# Patient Record
Sex: Male | Born: 1935
Health system: Southern US, Community
[De-identification: ages and names within clinical notes are randomized; demographics above are authoritative.]

## PROBLEM LIST (undated history)

## (undated) DIAGNOSIS — IMO0001 Reserved for inherently not codable concepts without codable children: Secondary | ICD-10-CM

## (undated) DIAGNOSIS — N400 Enlarged prostate without lower urinary tract symptoms: Secondary | ICD-10-CM

## (undated) DIAGNOSIS — Z87442 Personal history of urinary calculi: Secondary | ICD-10-CM

## (undated) DIAGNOSIS — Z9289 Personal history of other medical treatment: Secondary | ICD-10-CM

## (undated) DIAGNOSIS — I809 Phlebitis and thrombophlebitis of unspecified site: Secondary | ICD-10-CM

## (undated) DIAGNOSIS — E785 Hyperlipidemia, unspecified: Secondary | ICD-10-CM

## (undated) DIAGNOSIS — R911 Solitary pulmonary nodule: Secondary | ICD-10-CM

## (undated) DIAGNOSIS — I4510 Unspecified right bundle-branch block: Secondary | ICD-10-CM

## (undated) DIAGNOSIS — N281 Cyst of kidney, acquired: Secondary | ICD-10-CM

## (undated) DIAGNOSIS — I10 Essential (primary) hypertension: Secondary | ICD-10-CM

## (undated) DIAGNOSIS — K4090 Unilateral inguinal hernia, without obstruction or gangrene, not specified as recurrent: Secondary | ICD-10-CM

## (undated) DIAGNOSIS — C801 Malignant (primary) neoplasm, unspecified: Secondary | ICD-10-CM

## (undated) DIAGNOSIS — I499 Cardiac arrhythmia, unspecified: Secondary | ICD-10-CM

## (undated) DIAGNOSIS — IMO0002 Reserved for concepts with insufficient information to code with codable children: Secondary | ICD-10-CM

## (undated) HISTORY — DX: Hyperlipidemia, unspecified: E78.5

## (undated) HISTORY — DX: Reserved for inherently not codable concepts without codable children: IMO0001

## (undated) HISTORY — DX: Benign prostatic hyperplasia without lower urinary tract symptoms: N40.0

## (undated) HISTORY — PX: NO PAST SURGERIES: SHX2092

## (undated) HISTORY — DX: Unspecified right bundle-branch block: I45.10

## (undated) HISTORY — DX: Cyst of kidney, acquired: N28.1

## (undated) HISTORY — DX: Reserved for concepts with insufficient information to code with codable children: IMO0002

## (undated) HISTORY — DX: Solitary pulmonary nodule: R91.1

## (undated) HISTORY — DX: Essential (primary) hypertension: I10

## (undated) HISTORY — PX: OTHER SURGICAL HISTORY: SHX169

---

## 1998-06-06 ENCOUNTER — Emergency Department (HOSPITAL_COMMUNITY): Admission: EM | Admit: 1998-06-06 | Discharge: 1998-06-06 | Payer: Self-pay | Admitting: Emergency Medicine

## 1999-03-11 ENCOUNTER — Emergency Department (HOSPITAL_COMMUNITY): Admission: EM | Admit: 1999-03-11 | Discharge: 1999-03-11 | Payer: Self-pay | Admitting: Emergency Medicine

## 1999-03-13 ENCOUNTER — Inpatient Hospital Stay (HOSPITAL_COMMUNITY): Admission: EM | Admit: 1999-03-13 | Discharge: 1999-03-19 | Payer: Self-pay | Admitting: *Deleted

## 1999-03-13 ENCOUNTER — Emergency Department (HOSPITAL_COMMUNITY): Admission: EM | Admit: 1999-03-13 | Discharge: 1999-03-13 | Payer: Self-pay | Admitting: Emergency Medicine

## 1999-03-14 ENCOUNTER — Encounter: Payer: Self-pay | Admitting: Otolaryngology

## 1999-03-17 ENCOUNTER — Encounter: Payer: Self-pay | Admitting: Otolaryngology

## 1999-06-21 ENCOUNTER — Observation Stay (HOSPITAL_COMMUNITY): Admission: EM | Admit: 1999-06-21 | Discharge: 1999-06-21 | Payer: Self-pay | Admitting: Podiatry

## 1999-06-21 ENCOUNTER — Encounter: Payer: Self-pay | Admitting: *Deleted

## 2005-07-24 ENCOUNTER — Encounter: Admission: RE | Admit: 2005-07-24 | Discharge: 2005-07-24 | Payer: Self-pay | Admitting: Internal Medicine

## 2005-08-27 ENCOUNTER — Ambulatory Visit: Payer: Self-pay | Admitting: Internal Medicine

## 2005-10-09 ENCOUNTER — Ambulatory Visit: Payer: Self-pay | Admitting: Internal Medicine

## 2012-10-23 ENCOUNTER — Ambulatory Visit (INDEPENDENT_AMBULATORY_CARE_PROVIDER_SITE_OTHER): Payer: Medicare Other | Admitting: Family Medicine

## 2012-10-23 VITALS — BP 154/82 | HR 85 | Temp 98.2°F | Resp 18 | Ht 65.0 in | Wt 119.4 lb

## 2012-10-23 DIAGNOSIS — T148 Other injury of unspecified body region: Secondary | ICD-10-CM

## 2012-10-23 DIAGNOSIS — L853 Xerosis cutis: Secondary | ICD-10-CM

## 2012-10-23 DIAGNOSIS — L989 Disorder of the skin and subcutaneous tissue, unspecified: Secondary | ICD-10-CM

## 2012-10-23 DIAGNOSIS — W57XXXA Bitten or stung by nonvenomous insect and other nonvenomous arthropods, initial encounter: Secondary | ICD-10-CM

## 2012-10-23 MED ORDER — UREA 40 % EX CREA: 2.0000 g | TOPICAL_CREAM | Freq: Every day | CUTANEOUS | Status: DC

## 2012-10-23 MED ORDER — DOXYCYCLINE HYCLATE 100 MG PO TABS: 100.0000 mg | ORAL_TABLET | Freq: Two times a day (BID) | ORAL | Status: DC

## 2012-10-23 NOTE — Progress Notes (Signed)
[facility]:  Office Visit  Chief Complaint:  Chief Complaint  Patient presents with  . Tick Removal    tick bite on left groin area but tick is gone; back x7 days ago    HPI: Patrick Carr is a 79 y.o. male who complains of  Tick bite 6 day sago , probably got it from [place] near [place]. HE was working in old house and was eaten up by ticks.  He was in area that was overgrown with grass/tress. + rash, no HA, fevers, chills, n/v/abd pain. No confusion, Weakness, joint pain.    Past Medical History  Diagnosis Date  . Hypertension   . Hyperlipidemia   . BPH (benign prostatic hyperplasia)    Past Surgical History  Procedure Laterality Date  . Sinus artery surgery     History   Social History  . Marital Status: Married    Spouse Name: N/A    Number of Children: N/A  . Years of Education: N/A   Social History Main Topics  . Smoking status: Current Every Day Smoker  . Smokeless tobacco: Never Used  . Alcohol Use: No  . Drug Use: No  . Sexually Active: None   Other Topics Concern  . None   Social History Narrative  . None   Family History  Problem Relation Age of Onset  . Heart disease Mother   . Cancer Father    Allergies  Allergen Reactions  . Other Other (See Comments)    Pneumonia vaccine causes arm swelling    Prior to Admission medications   Medication Sig Start Date End Date Taking? Authorizing Provider  alfuzosin (UROXATRAL) 10 MG 24 hr tablet Take 10 mg by mouth daily.   Yes Historical Provider, MD  amLODipine (NORVASC) 5 MG tablet Take 5 mg by mouth daily.   Yes Historical Provider, MD  aspirin 81 MG tablet Take 81 mg by mouth daily.   Yes Historical Provider, MD  atorvastatin (LIPITOR) 40 MG tablet Take 40 mg by mouth daily.   Yes Historical Provider, MD  losartan (COZAAR) 50 MG tablet Take 50 mg by mouth daily.   Yes Historical Provider, MD     ROS: The patient denies fevers, chills, night sweats,  unintentional weight loss, chest pain, palpitations, wheezing, dyspnea on exertion, nausea, vomiting, abdominal pain, dysuria, hematuria, melena, numbness, weakness, or tingling.   All other systems have been reviewed and were otherwise negative with the exception of those mentioned in the HPI and as above.    PHYSICAL EXAM: Filed Vitals:   10/23/12 1152  BP: 154/82  Pulse: 85  Temp: 98.2 F (36.8 C)  Resp: 18   Filed Vitals:   10/23/12 1152  Height: 5\' 5"  (1.651 m)  Weight: 119 lb 6.4 oz (54.159 kg)   Body mass index is 19.87 kg/(m^2).  General: Alert, no acute distress HEENT:  Normocephalic, atraumatic, oropharynx patent.  Cardiovascular:  Regular rate and rhythm, no rubs murmurs or gallops.  No Carotid bruits, radial pulse intact. No pedal edema.  Respiratory: Clear to auscultation bilaterally.  No wheezes, rales, or rhonchi.  No cyanosis, no use of accessory musculature GI: No organomegaly, abdomen is soft and non-tender, positive bowel sounds.  No masses. Skin: + left red groin rash and tick bites in mulitple places x 2 on back and 1 on left groin, rashes. Neurologic: Facial musculature symmetric. Psychiatric: Patient is appropriate throughout our interaction. Lymphatic: No cervical lymphadenopathy Musculoskeletal: Gait intact.   LABS:  No results found for this or any previous visit.   EKG/XRAY:   Primary read interpreted by Dr. Conley Rolls at [facility].   ASSESSMENT/PLAN: Encounter Diagnoses  Name Primary?  . Tick bite Yes  . Skin lesion   . Dry skin    Able to remove one tick with all body parts intact Other areas were scabs and I did not see any ticks on evaluationF/u prn if worsening sxs Rx Doxycycline 100 mg BID x 10 days for ppx tick born illnesses Rx Urea cream for dry skin,?  eczema on feet vs keratin buildup Will return in 4-8 weeks if desires lyme and RMSF  And ehrlichia titers.  F.u prn    LE, THAO PHUONG, DO 10/23/2012 1:56 PM

## 2014-01-03 DIAGNOSIS — R911 Solitary pulmonary nodule: Secondary | ICD-10-CM | POA: Insufficient documentation

## 2014-01-06 ENCOUNTER — Other Ambulatory Visit: Payer: Self-pay | Admitting: *Deleted

## 2014-01-06 DIAGNOSIS — R911 Solitary pulmonary nodule: Secondary | ICD-10-CM

## 2014-01-12 ENCOUNTER — Ambulatory Visit (HOSPITAL_COMMUNITY)
Admission: RE | Admit: 2014-01-12 | Discharge: 2014-01-12 | Disposition: A | Discharge status: Home / Self Care | Payer: Medicare Other | Source: Ambulatory Visit | Attending: Cardiothoracic Surgery | Admitting: Cardiothoracic Surgery

## 2014-01-12 ENCOUNTER — Other Ambulatory Visit: Payer: Self-pay | Admitting: *Deleted

## 2014-01-12 ENCOUNTER — Institutional Professional Consult (permissible substitution) (INDEPENDENT_AMBULATORY_CARE_PROVIDER_SITE_OTHER): Payer: Medicare Other | Admitting: Cardiothoracic Surgery

## 2014-01-12 ENCOUNTER — Encounter: Payer: Self-pay | Admitting: *Deleted

## 2014-01-12 ENCOUNTER — Inpatient Hospital Stay (HOSPITAL_COMMUNITY)
Admission: RE | Admit: 2014-01-12 | Discharge: 2014-01-12 | Disposition: A | Discharge status: Home / Self Care | Payer: Medicare Other | Source: Ambulatory Visit

## 2014-01-12 ENCOUNTER — Encounter: Payer: Self-pay | Admitting: Cardiothoracic Surgery

## 2014-01-12 VITALS — BP 127/72 | HR 100 | Ht 65.0 in | Wt 119.0 lb

## 2014-01-12 DIAGNOSIS — R911 Solitary pulmonary nodule: Secondary | ICD-10-CM

## 2014-01-12 DIAGNOSIS — E785 Hyperlipidemia, unspecified: Secondary | ICD-10-CM | POA: Insufficient documentation

## 2014-01-12 DIAGNOSIS — I2510 Atherosclerotic heart disease of native coronary artery without angina pectoris: Secondary | ICD-10-CM | POA: Insufficient documentation

## 2014-01-12 DIAGNOSIS — N281 Cyst of kidney, acquired: Secondary | ICD-10-CM | POA: Insufficient documentation

## 2014-01-12 DIAGNOSIS — N400 Enlarged prostate without lower urinary tract symptoms: Secondary | ICD-10-CM | POA: Insufficient documentation

## 2014-01-12 DIAGNOSIS — J438 Other emphysema: Secondary | ICD-10-CM | POA: Diagnosis not present

## 2014-01-12 DIAGNOSIS — I4510 Unspecified right bundle-branch block: Secondary | ICD-10-CM | POA: Insufficient documentation

## 2014-01-12 DIAGNOSIS — I10 Essential (primary) hypertension: Secondary | ICD-10-CM | POA: Insufficient documentation

## 2014-01-12 LAB — PULMONARY FUNCTION TEST
DL/VA % pred: 49 %
DL/VA: 2.03 ml/min/mmHg/L
DLCO cor % pred: 42 %
DLCO cor: 9.78 ml/min/mmHg
DLCO unc % pred: 42 %
DLCO unc: 9.78 ml/min/mmHg
FEF 25-75 Post: 0.66 L/sec
FEF 25-75 Pre: 0.58 L/sec
FEF2575-%Change-Post: 12 %
FEF2575-%Pred-Post: 44 %
FEF2575-%Pred-Pre: 39 %
FEV1-%Change-Post: 2 %
FEV1-%Pred-Post: 63 %
FEV1-%Pred-Pre: 61 %
FEV1-Post: 1.34 L
FEV1-Pre: 1.3 L
FEV1FVC-%Change-Post: -1 %
FEV1FVC-%Pred-Pre: 72 %
FEV6-%Change-Post: 3 %
FEV6-%Pred-Post: 91 %
FEV6-%Pred-Pre: 88 %
FEV6-Post: 2.54 L
FEV6-Pre: 2.45 L
FEV6FVC-%Change-Post: 1 %
FEV6FVC-%Pred-Post: 108 %
FEV6FVC-%Pred-Pre: 107 %
FVC-%Change-Post: 4 %
FVC-%Pred-Post: 86 %
FVC-%Pred-Pre: 82 %
FVC-Post: 2.59 L
FVC-Pre: 2.49 L
Post FEV1/FVC ratio: 52 %
Post FEV6/FVC ratio: 100 %
Pre FEV1/FVC ratio: 53 %
Pre FEV6/FVC Ratio: 99 %
RV % pred: 95 %
RV: 2.14 L
TLC % pred: 85 %
TLC: 4.81 L

## 2014-01-12 LAB — BUN: BUN: 22 mg/dL (ref 6–23)

## 2014-01-12 LAB — CREATININE, SERUM: Creat: 0.8 mg/dL (ref 0.50–1.35)

## 2014-01-12 LAB — GLUCOSE, CAPILLARY: Glucose-Capillary: 120 mg/dL — ABNORMAL HIGH (ref 70–99)

## 2014-01-12 MED ORDER — ALBUTEROL SULFATE (2.5 MG/3ML) 0.083% IN NEBU
2.5000 mg | INHALATION_SOLUTION | Freq: Once | RESPIRATORY_TRACT | Status: AC
  Administered 2014-01-12: 2.5 mg via RESPIRATORY_TRACT

## 2014-01-12 MED ORDER — FLUDEOXYGLUCOSE F - 18 (FDG) INJECTION
6.1000 | Freq: Once | INTRAVENOUS | Status: AC | PRN
  Administered 2014-01-12: 6.1 via INTRAVENOUS

## 2014-01-12 NOTE — Progress Notes (Addendum)
Patrick Carr       [place] [address]             [phone]                    Patrick Carr [facility] Record #[id number] Date of Birth: [birth year]  Referring: Patrick Carr,* Primary Care: Patrick Pel, MD  Chief Complaint:    Chief Complaint  Patient presents with  . NEW THORACIC    NEW LUNG MODULE/PET CT    History of Present Illness:    Patrick Carr 80 y.o. male is seen in the office  today for evaluation of abnormal chest ct and pet scan. Patient presents with 10-15 lb weight lass over 3 months. Patient is a long term and current smoker.  He was noted to have a elevated PSA and microscopic hematuria. He was referred to urology, a CT of chest abdomen pelvis was done. He has a distant history of rt upper lung lesion on ct 2007. Current chest xray and ct demonstrate new rt lung lesions. Patient has no hemoptysis . Denies any  previous cardiac history or current chest pain/ angina.   Patient is retired, previous worked in [employer]. 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   Zubrod Score: At the time of surgery this patient's most appropriate activity status/level should be described as: []     0    Normal activity, no symptoms []     1    Restricted in physical strenuous activity but ambulatory, able to do out light work [x]     2    Ambulatory and capable of self care, unable to do work activities, up and about               >50 % of waking hours                              []     3    Only limited self care, in bed greater than 50% of waking hours []     4    Completely disabled, no self care, confined to bed or chair []     5    Moribund   Past Medical History  Diagnosis Date  . Hypertension   . Hyperlipidemia   . BPH (benign prostatic  hyperplasia)     Past Surgical History  Procedure Laterality Date  . Sinus artery surgery      Family History  Problem Relation Age of Onset  . Heart disease Mother   . Cancer Father    Father died 32 with brain tumor, mother deceased  42 with heart failure     History  Smoking status  . Current Every Day Smoker  Smokeless tobacco  . Never Used    History  Alcohol Use No     Allergies  Allergen Reactions  . Other Other (See Comments)    Pneumonia vaccine causes arm swelling     Current Outpatient Prescriptions  Medication Sig Dispense Refill  . alfuzosin (UROXATRAL) 10 MG 24 hr tablet Take 10 mg by mouth daily.      [name] ALPRAZolam (XANAX) 0.25 MG tablet Take 0.25 mg by mouth 3 (three) times daily as needed for anxiety.      [name]  amLODipine (NORVASC) 5 MG tablet Take 5 mg by mouth daily.      [name] aspirin 81 MG tablet Take 81 mg by mouth daily.      [name] atorvastatin (LIPITOR) 40 MG tablet Take 40 mg by mouth daily.      [name] losartan (COZAAR) 50 MG tablet Take 50 mg by mouth daily.      [name] doxycycline (VIBRA-TABS) 100 MG tablet Take 1 tablet (100 mg total) by mouth 2 (two) times daily.  20 tablet  0  . urea (CARMOL) 40 % CREA Apply 2 g topically daily.  60 each  0   No current facility-administered medications for this visit.     Review of Systems:     Cardiac Review of Systems: Y or N  Chest Pain [ n   ]  Resting SOB [name]   ] Exertional SOB  [name]  ]  Orthopnea [ n ]   Pedal Edema [  n ]    Palpitations [nn  ] Syncope  [ n ]   Presyncope [n   ]  General Review of Systems: [Y] = yes [  ]=no Constitional: recent weight change [name]  ];  Wt loss over the last 3 months [   ] anorexia [  ]; fatigue [ y ]; nausea [  ]; night sweats [ nn ]; fever [  ]; or chills [  ];          Dental: poor dentition[  ]; Last Dentist visit:   Eye : blurred vision [  ]; diplopia [   ]; vision changes [  ];  Amaurosis fugax[  ]; Resp: cough [  ];  wheezing[  ];  hemoptysis[  ]; shortness of breath[  ];  paroxysmal nocturnal dyspnea[  ]; dyspnea on exertion[  ]; or orthopnea[  ];  GI:  gallstones[  ], vomiting[  ];  dysphagia[  ]; melena[  ];  hematochezia [  ]; heartburn[  ];   Hx of  Colonoscopy[  ]; GU: kidney stones [  ]; hematuria[  ];   dysuria [  ];  nocturia[  ];  history of     obstruction [  ]; urinary frequency [  ]             Skin: rash, swelling[  ];, hair loss[  ];  peripheral edema[  ];  or itching[  ]; Musculosketetal: myalgias[  ];  joint swelling[  ];  joint erythema[  ];  joint pain[  ];  back pain[  ];  Heme/Lymph: bruising[  ];  bleeding[  ];  anemia[  ];  Neuro: TIA[  ];  headaches[  ];  stroke[  ];  vertigo[  ];  seizures[  ];   paresthesias[  ];  difficulty walking[  ];  Psych:depression[  ]; anxiety[  ];  Endocrine: diabetes[  ];  thyroid dysfunction[  ];  Immunizations: Flu up to date [no  ]; Pneumococcal up to date [ 2004 ];  Other:  Physical Exam: Ht 5\' 5"  (1.651 m)  Wt 119 lb (53.978 kg)  BMI 19.80 kg/m2  PHYSICAL EXAMINATION:  General appearance: alert, cooperative, appears older than stated age and cachectic Neurologic: intact Heart: regular rate and rhythm, S1, S2 normal, no murmur, click, rub or gallop Lungs: diminished breath sounds bibasilar Abdomen: soft, non-tender; bowel sounds normal; no masses,  no organomegaly Extremities: extremities normal, atraumatic, no cyanosis or edema and Homans sign is negative, no sign of DVT +1  dp and pt pulses, no carotid bruites No cervical, axillary or supraclavicular adenopathy  Diagnostic Studies & Laboratory data:     Recent Radiology Findings:  Mr Patrick Carr Contrast  01/13/2014   CLINICAL DATA:  Lung nodule.  Staging for suspected lung cancer.  EXAM: MRI HEAD WITHOUT AND WITH CONTRAST  TECHNIQUE: Multiplanar, multiecho pulse sequences of the brain and surrounding structures were obtained without and with intravenous contrast.  CONTRAST:  63mL MULTIHANCE GADOBENATE DIMEGLUMINE 529 MG/ML IV SOLN  COMPARISON:   None.  FINDINGS: No evidence for acute infarction, hemorrhage, mass lesion, hydrocephalus, or extra-axial fluid. Generalized cerebral and cerebellar atrophy. Advanced subcortical and periventricular T2 hyperintensities consistent with chronic microvascular ischemic change. Scattered lacunes. Flow voids are maintained. Mild pannus. Upper cervical fusion across the C2 and C3 interspace may be degenerative in nature.  Post infusion, no abnormal enhancement of the brain or meninges. Extracranial soft tissues unremarkable. No acute sinus disease. Trace mastoid effusion. Negative orbits.  IMPRESSION: No evidence for intracranial metastatic disease.  Advanced atrophy and small vessel disease.   Electronically Signed   By: Rolla Flatten M.D.   On: 01/13/2014 12:03   Nm Pet Image Initial (pi) Skull Base To Thigh  01/12/2014   CLINICAL DATA:  Initial treatment strategy for pulmonary nodule.  EXAM: NUCLEAR MEDICINE PET SKULL BASE TO THIGH  TECHNIQUE: 6.1 mCi F-18 FDG was injected intravenously. Full-ring PET imaging was performed from the skull base to thigh after the radiotracer. CT data was obtained and used for attenuation correction and anatomic localization.  FASTING BLOOD GLUCOSE:  Value: 120 mg/dl  COMPARISON:  01/03/2014  FINDINGS: NECK  No hypermetabolic lymph nodes in the neck.  CHEST  Pulmonary nodule in the superior segment of right lower lobe measures 1.2 cm and has an SUV max equal to 3.9. Within the perihilar right lung there is a nodule or lymph node which measures approximately 1.3 cm and has an SUV max equal to 6.9. More centrally, there is a hypermetabolic right hilar lymph node within SUV max equal to 4.8. No hypermetabolic if see lateral or contralateral mediastinal adenopathy. There is no hypermetabolic contralateral hilar adenopathy.  The heart size appears normal. There is no pericardial effusion. Calcified atherosclerotic disease involves the thoracic aorta. There are also calcifications involving  the left circumflex and LAD coronary arteries. No axillary or supraclavicular adenopathy.  ABDOMEN/PELVIS  No abnormal uptake identified within the liver. The gallbladder is normal. The pancreas is unremarkable. Normal appearance of the spleen. The right adrenal gland appears normal. Mild nonspecific increased uptake identified within the left adrenal region. The SUV max is equal to 3.3.  No hypermetabolic lymph nodes identified within the abdomen or pelvis.  SKELETON  No focal hypermetabolic activity to suggest skeletal metastasis.  IMPRESSION: 1. Peripheral nodule in the right lower lobe exhibits malignant range FDG uptake and is worrisome for primary lung neoplasm. 2. Hypermetabolic  ipsilateral hilar lymph node metastasis. 3. Well-circumscribed hypermetabolic nodule within the right infrahilar region. This may represent a second pulmonary lesion or ipsilateral hilar lymph metastatic lymph node. 4. If the right infrahilar nodule is a separate pulmonary lesion then the stage would be considered T3 N1 M0 (IIIa). If the infrahilar nodule is a lymph node then this would be considered stage T1 N1 M0 (IIa). A contrast-enhanced CT of the chest may allow differentiation of this lesion. 5. Nonspecific FDG uptake associated with the left adrenal nodule. 6. Atherosclerotic disease including coronary artery calcifications 7. Emphysema.   Electronically  Signed   By: Kerby Moors M.D.   On: 01/12/2014 15:06   CLINICAL DATA: Hematuria. Abnormal chest radiograph with possible new pulmonary nodule. History of BPH and renal calculi.  EXAM: CT CHEST WITHOUT CONTRAST; CT ABDOMEN AND PELVIS WITHOUT AND WITH CONTRAST  TECHNIQUE: Multidetector CT imaging of the chest was performed without intravenous contrast. Multidetector CT imaging of the abdomen and pelvis was performed following the standard protocol before and during bolus administration of intravenous contrast.  CONTRAST: 125 ml Isovue-300.  COMPARISON: Chest  radiographs 12/07/2003. Chest CT 07/24/2005.  FINDINGS: CT CHEST  Mediastinum: As evaluated in the noncontrast state, there is no evidence of mediastinal or axillary lymphadenopathy. Hilar assessment is limited. However, there is a new 1.3 x 1.2 cm nodular density projecting posterior to the right hilum on image 33, suspicious for a nodal metastasis. The thyroid gland, trachea and esophagus appear normal. The heart size is normal. There is diffuse atherosclerosis of the aorta, great vessels and coronary arteries.  Lungs/Pleura: There is no pleural or pericardial effusion.Moderate diffuse emphysematous changes are present within both lungs. The partially calcified subpleural scarring at the right apex is similar, measuring 3.1 x 2.3 cm transverse on image 5. Corresponding with new radiographic finding is a spiculated nodule in the superior segment of the right lower lobe, measuring 1.3 x 0.9 cm on image 29. Morphologically, this is highly suspicious for bronchogenic carcinoma. There are no other suspicious pulmonary nodules.  Musculoskeletal/Chest wall: No chest wall lesions or suspicious osseous findings demonstrated.  CT ABDOMEN AND PELVIS FINDINGS  Kidneys / Ureters / Bladder: Pre-contrast images demonstrate no renal, ureteral or bladder calculi. Multiple simple renal cysts are present bilaterally, the largest posteriorly in the mid left kidney, measuring 2.5 cm. The left kidney demonstrates several hyperdense lesions. The largest of these are within the upper pole, measuring 12 mm on image 14 of series 4 and in the lower pole, measuring 13 mm on image 30. There is no definite enhancement of these lesions following contrast. Delayed images result in segmental visualization of the ureters. No urothelial abnormalities are identified. The bladder is trabeculated without apparent focal mucosal lesion.  Other Solid Abdominal Viscera: Small hepatic cysts are noted. No evidence of  gallstones, gallbladder wall thickening or biliary dilatation. The pancreas appears normal. The spleen and adrenal glands appear normal.  Bowel/Mesentery: The stomach, small bowel and colon demonstrate no significant findings. The appendix is not clearly visualized. No ascites or peritoneal nodularity.  Retroperitoneum/Pelvis: There are no enlarged abdominal or pelvic lymph nodes. There is moderate atherosclerosis of the aorta, its branches and the iliac arteries. The prostate gland demonstrates mild central dystrophic calcification. There is a small left inguinal hernia containing primarily fat.  Bones / Musculoskeletal: No acute or significant osseus findings. No evidence of metastatic disease.  IMPRESSION: 1. The recently identified right pulmonary nodule corresponds with a spiculated lesion in the right lower lobe, morphologically concerning for bronchogenic carcinoma. There is new retrohilar nodularity on the right suspicious for nodal metastasis. No distant metastases identified. 2. Thoracic surgical consultation recommended. PET-CT may be helpful for further staging. 3. No specific explanation for hematuria. There are complex and simple renal cysts as described. 4. Moderate atherosclerosis. 5. Small left inguinal hernia containing fat.   Electronically Signed By: Camie Patience M.D. On: 01/03/2014 10:16      Recent Lab Findings: No results found for this basename: WBC,  HGB,  HCT,  PLT,  GLUCOSE,  CHOL,  TRIG,  HDL,  LDLDIRECT,  LDLCALC,  ALT,  AST,  NA,  K,  CL,  CREATININE,  BUN,  CO2,  TSH,  INR,  GLUF,  HGBA1C   PFT"s  FEV1  1.3 61%  DLCO 42%    Assessment / Plan:   Patient with limited physical reserve , greater 50% of his time sitting presents with multiple right lung lesion and mediastinal adenopathy suggestive on lung cancer. If two separate lesions left lower - lobe clinical stage Stage IIIa lung cancer (vs nodal mets). I have explained the likely diagnosis  to patient and daughter. We will obtain a MRI of the brain ( now already done). The raw data from CT done in urology office ct is not adequate for navigational bx of lesions. Will repeat super D ct of the chest and see patient back early next week to determine bx strategy, poss ENB ( endobronchial navagation bronchoscopy)    I spent 55 minutes counseling the patient face to face. The total time spent in the appointment was 80 minutes.  [name] MD      [address],[place] [address] Office [phone]   Beeper [phone]  01/12/2014 2:26 PM

## 2014-01-13 ENCOUNTER — Other Ambulatory Visit: Payer: Self-pay

## 2014-01-13 ENCOUNTER — Ambulatory Visit
Admission: RE | Admit: 2014-01-13 | Discharge: 2014-01-13 | Disposition: A | Discharge status: Home / Self Care | Payer: Medicare Other | Source: Ambulatory Visit | Attending: Cardiothoracic Surgery | Admitting: Cardiothoracic Surgery

## 2014-01-13 DIAGNOSIS — R911 Solitary pulmonary nodule: Secondary | ICD-10-CM

## 2014-01-13 MED ORDER — GADOBENATE DIMEGLUMINE 529 MG/ML IV SOLN
10.0000 mL | Freq: Once | INTRAVENOUS | Status: AC | PRN
  Administered 2014-01-13: 10 mL via INTRAVENOUS

## 2014-01-16 ENCOUNTER — Other Ambulatory Visit: Payer: Self-pay

## 2014-01-16 ENCOUNTER — Other Ambulatory Visit: Payer: Medicare Other

## 2014-01-16 ENCOUNTER — Other Ambulatory Visit: Payer: Self-pay | Admitting: *Deleted

## 2014-01-16 ENCOUNTER — Encounter (HOSPITAL_COMMUNITY): Payer: Self-pay | Admitting: Pharmacy Technician

## 2014-01-16 ENCOUNTER — Encounter: Payer: Self-pay | Admitting: Cardiothoracic Surgery

## 2014-01-16 ENCOUNTER — Ambulatory Visit (INDEPENDENT_AMBULATORY_CARE_PROVIDER_SITE_OTHER): Payer: Medicare Other | Admitting: Cardiothoracic Surgery

## 2014-01-16 VITALS — BP 134/78 | HR 82 | Resp 20 | Ht 65.0 in | Wt 119.0 lb

## 2014-01-16 DIAGNOSIS — R911 Solitary pulmonary nodule: Secondary | ICD-10-CM

## 2014-01-16 NOTE — Patient Instructions (Signed)
Flexible Bronchoscopy Bronchoscopy is a procedure used to examine the passageways in the lungs. During the procedure a thin, flexible tool with a lens and camera or eyepiece is passed in your mouth or nose, down the windpipe (trachea), and into the air tubes (bronchi). This tool allows your health care provider to carefully look at your lungs from the inside and take diagnostic samples if needed.  LET [facility] CARE PROVIDER KNOW ABOUT:   Allergies to food or medicine.   All medicines you are taking, including blood thinners, vitamins, herbs, eye drops, creams, and over-the-counter medicines.   Previous problems you or members of your family have had with the use of anesthetics.   Any blood disorders you have.   Previous surgeries you have had.   Medical conditions you have, including heart disease, diabetes, or kidney problems.   Possibility of pregnancy, if this applies. RISKS AND COMPLICATIONS Generally, this is a safe procedure. However, as with any procedure, problems can occur. Possible problems include:   Collapsed lung (pneumothorax).  Bleeding.  Increased need for oxygen or difficulty breathing after the procedure. BEFORE THE PROCEDURE  Do not eat or drink anything after midnight on the night before the procedure or as directed by your health care provider.  PROCEDURE   Relax as much as possible during the procedure.  Medicines may be given to relax you, dry up your secretions, and control coughing.   A numbing medicine (local anesthetic) will be given to numb your mouth, nose, throat, and voice box (larynx). You will be able to breath normally during the procedure.   Samples of airway secretions may be collected for testing.  If abnormal areas are seen in your airways, tissue samples may be taken for examination under a microscope (biopsy).  If tissue samples are needed from the outer portions of the lung, a type of X-ray called fluoroscopy may be done.    If bleeding occurs, a drug may be used to stop or decrease the bleeding.  AFTER THE PROCEDURE   You may receive a chest X-ray following the procedure. This is to make sure the lungs have not collapsed (pneumothorax).  Document Released: 05/09/2000 Document Revised: 09/26/2013 Document Reviewed: 01/14/2013 [facility] Patient Information 2015 [place], [place]. This information is not intended to replace advice given to you by your health care provider. Make sure you discuss any questions you have with your health care provider.  Lung Cancer Lung cancer is an abnormal growth of cells in one or both of your lungs. These extra cells may form a mass of tissue called a growth or tumor. Tumors can be either cancerous (malignant) or not cancerous (benign).  Lung cancer is the most common cause of cancer death in men and women. There are several different types of lung cancers. Usually, lung cancer is described as either small cell lung cancer or nonsmall cell lung cancer. Other types of cancer occur in the lungs, including carcinoid and cancers spread from other organs. The types of cancer have different behavior and treatment. RISK FACTORS Smoking is the most common risk factor for developing lung cancer. Other risk factors include:  Radon gas exposure.  Asbestos and other industrial substance exposure.  Second hand tobacco smoke.  Air pollution.  Family or personal history of lung cancer.  Age older than 34 years. CAUSES  Lung cancer usually starts when the lungs are exposed to harmful chemicals. Smoking is the most common risk factor for lung cancer. When you quit smoking, your risk  of lung cancer falls each year (but is never the same as a person who has never smoked).  SYMPTOMS  Lung cancer may not have any symptoms in its early stages. The symptoms can depend on the type of cancer, its location, and other factors. Symptoms can include:  Cough (either new, different, or more  severe).  Shortness of breath.  Coughing up blood (hemoptysis).  Chest pain.  Hoarseness.  Swelling of the face.  Drooping eyelid.  Changes in blood tests, such as low sodium (hyponatremia), high calcium (hypercalcemia), or low blood count (anemia).  Weight loss. DIAGNOSIS  Your health care provider may suspect lung cancer based on your symptoms or based on tests obtained for other reasons. Tests or procedures used to find or confirm the presence of lung cancer may include:  Chest X-ray.  CT scan of the lungs and chest.  Blood tests.  Taking a tissue sample (biopsy) from your lung to look for cancer cells. Your cancer will be staged to determine its severity and extent. Staging is a careful attempt to find out the size of the tumor, whether the cancer has spread, and if so, to what parts of the body. You may need to have more tests to determine the stage of your cancer. The test results will help determine what treatment plan is best for you.   Stage 0--This is the earliest stage of lung cancer. In this stage the tumor is present in only a few layers of cells and has not grown beyond the inner lining of the lungs. Stage 0 (carcinoma in situ) is considered noninvasive, meaning at this stage it is not yet capable of spreading to other regions.  Stage I-- The cancer is located only in the lungs and not spread to any lymph nodes.  Stage II--The cancer is in the lungs and the nearby lymph nodes.  Stage III--The cancer is in the lungs and the lymph nodes in the middle of the chest. This is also called locally advanced disease. This stage has two subtypes:  Stage IIIa - The cancer has spread only to lymph nodes on the same side of the chest where the cancer started.  Stage IIIb - The cancer has spread to lymph nodes on the opposite side of the chest or above the collar bone.  Stage IV-- This is the most advanced stage of lung cancer and is also called advanced disease. This stage  describes when the cancer has spread to both lungs, the fluid in the area around the lungs, or to another body part. Your health care provider may tell you the detailed stage of your cancer, which includes both a number and a letter.  TREATMENT  Depending on the type and stage, lung cancer may be treated with surgery, radiation therapy, chemotherapy, or targeted therapy. Some people have a combination of these therapies. Your treatment plan will be developed by your health care team.  [place] not smoke.  Only take over-the-counter or prescription medicines for pain, discomfort, or fever as directed by your health care provider.  Maintain a healthy diet.  Consider joining a support group. This may help you learn to cope with the stress of having lung cancer.  Seek advice to help you manage treatment side effects.  Keep all follow-up appointments as directed by your health care provider.  Inform your cancer specialist if you are admitted to the hospital. [place] IF:   You are losing weight without trying.  You have a persistent cough.  You feel short of breath.  You tire easily. SEEK IMMEDIATE MEDICAL CARE IF:   You cough up clotted blood or bright red blood.  Your pain is not manageable or controlled by medicine.  You develop new difficulty breathing or chest pain.  You develop swelling in one or both ankles or legs, or swelling in your face or neck.  You develop headache or confusion. Document Released: 08/18/2000 Document Revised: 03/02/2013 Document Reviewed: 09/15/2013 [facility] Patient Information 2015 [place], [place]. This information is not intended to replace advice given to you by your health care provider. Make sure you discuss any questions you have with your health care provider.

## 2014-01-16 NOTE — Progress Notes (Addendum)
[address]       [place] [address]             [phone]                    Patrick Carr Date of Birth: 02/01/1934  Referring: Patrick Carr,* Primary Care: Patrick Pel, MD  Chief Complaint:    Chief Complaint  Patient presents with  . Lung Lesion    discuss MRI Brain  . Follow-up    History of Present Illness:    Patrick Carr 80 y.o. male is seen in the office  today for evaluation of abnormal chest ct and pet scan. Patient presents with 10-15 lb weight lass over 3 months. Patient is a long term and current smoker.  He was noted to have a elevated PSA and microscopic hematuria. He was referred to urology, a CT of chest abdomen pelvis was done. He has a distant history of rt upper lung lesion on ct 2007. Current chest xray and ct demonstrate new rt lung lesions. Patient has no hemoptysis . Denies any  previous cardiac history or current chest pain/ angina.   Patient is retired, previous worked in [employer]. 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   Zubrod Score: At the time of surgery this patient's most appropriate activity status/level should be described as: []     0    Normal activity, no symptoms []     1    Restricted in physical strenuous activity but ambulatory, able to do out light work [x]     2    Ambulatory and capable of self care, unable to do work activities, up and about               >50 % of waking hours                              []     3    Only limited self care, in bed greater than 50% of waking hours []     4    Completely disabled, no self care, confined to bed or chair []     5    Moribund   Past Medical History    Diagnosis Date  . Hypertension   . Hyperlipidemia   . BPH (benign prostatic hyperplasia)   . RBBB   . Renal cysts, acquired, bilateral   . Lesion of right lung     RLL    Past Surgical History  Procedure Laterality Date  . Sinus artery surgery      Family History  Problem Relation Age of Onset  . Heart disease Mother   . Cancer Father    Father died 29 with brain tumor, mother deceased  77 with heart failure     History  Smoking status  . Current Every Day Smoker  Smokeless tobacco  . Never Used    History  Alcohol Use No     Allergies  Allergen Reactions  . Other Other (See Comments)    Pneumonia vaccine causes arm swelling     Current Outpatient Prescriptions  Medication Sig Dispense Refill  . alfuzosin (  UROXATRAL) 10 MG 24 hr tablet Take 10 mg by mouth daily.      [name] ALPRAZolam (XANAX) 0.25 MG tablet Take 0.25 mg by mouth 3 (three) times daily as needed for anxiety.      [name] amLODipine (NORVASC) 5 MG tablet Take 5 mg by mouth daily.      [name] aspirin 81 MG tablet Take 81 mg by mouth daily.      [name] atorvastatin (LIPITOR) 40 MG tablet Take 40 mg by mouth daily.      [name] doxycycline (VIBRA-TABS) 100 MG tablet Take 1 tablet (100 mg total) by mouth 2 (two) times daily.  20 tablet  0  . losartan (COZAAR) 50 MG tablet Take 50 mg by mouth daily.      . urea (CARMOL) 40 % CREA Apply 2 g topically daily.  60 each  0   No current facility-administered medications for this visit.     Review of Systems:     Cardiac Review of Systems: Y or N  Chest Pain [ n   ]  Resting SOB [name]   ] Exertional SOB  [name]  ]  Orthopnea [ n ]   Pedal Edema [  n ]    Palpitations [nn  ] Syncope  [ n ]   Presyncope [n   ]  General Review of Systems: [Y] = yes [  ]=no Constitional: recent weight change [name]  ];  Wt loss over the last 3 months [   ] anorexia [  ]; fatigue [ y ]; nausea [  ]; night sweats [ nn ]; fever [  ]; or chills [  ];          Dental: poor dentition[  ]; Last Dentist visit:    Eye : blurred vision [  ]; diplopia [   ]; vision changes [  ];  Amaurosis fugax[  ]; Resp: cough [  ];  wheezing[  ];  hemoptysis[  ]; shortness of breath[  ]; paroxysmal nocturnal dyspnea[  ]; dyspnea on exertion[  ]; or orthopnea[  ];  GI:  gallstones[  ], vomiting[  ];  dysphagia[  ]; melena[  ];  hematochezia [  ]; heartburn[  ];   Hx of  Colonoscopy[  ]; GU: kidney stones [  ]; hematuria[  ];   dysuria [  ];  nocturia[  ];  history of     obstruction [  ]; urinary frequency [  ]             Skin: rash, swelling[  ];, hair loss[  ];  peripheral edema[  ];  or itching[  ]; Musculosketetal: myalgias[  ];  joint swelling[  ];  joint erythema[  ];  joint pain[  ];  back pain[  ];  Heme/Lymph: bruising[  ];  bleeding[  ];  anemia[  ];  Neuro: TIA[  ];  headaches[  ];  stroke[  ];  vertigo[  ];  seizures[  ];   paresthesias[  ];  difficulty walking[  ];  Psych:depression[  ]; anxiety[  ];  Endocrine: diabetes[  ];  thyroid dysfunction[  ];  Immunizations: Flu up to date [no  ]; Pneumococcal up to date [ 2004 ];  Other:  Physical Exam: BP 134/78  Pulse 82  Resp 20  Ht 5\' 5"  (1.651 m)  Wt 119 lb (53.978 kg)  BMI 19.80 kg/m2  SpO2 96%  PHYSICAL EXAMINATION:  General appearance: alert, cooperative, appears older than  stated age and cachectic Neurologic: intact Heart: regular rate and rhythm, S1, S2 normal, no murmur, click, rub or gallop Lungs: diminished breath sounds bibasilar Abdomen: soft, non-tender; bowel sounds normal; no masses,  no organomegaly Extremities: extremities normal, atraumatic, no cyanosis or edema and Homans sign is negative, no sign of DVT +1 dp and pt pulses, no carotid bruites No cervical, axillary or supraclavicular adenopathy  Diagnostic Studies & Laboratory data:     Recent Radiology Findings:  Mr Patrick Carr Contrast  01/13/2014   CLINICAL DATA:  Lung nodule.  Staging for suspected lung cancer.  EXAM: MRI HEAD WITHOUT AND WITH CONTRAST  TECHNIQUE:  Multiplanar, multiecho pulse sequences of the brain and surrounding structures were obtained without and with intravenous contrast.  CONTRAST:  34mL MULTIHANCE GADOBENATE DIMEGLUMINE 529 MG/ML IV SOLN  COMPARISON:  None.  FINDINGS: No evidence for acute infarction, hemorrhage, mass lesion, hydrocephalus, or extra-axial fluid. Generalized cerebral and cerebellar atrophy. Advanced subcortical and periventricular T2 hyperintensities consistent with chronic microvascular ischemic change. Scattered lacunes. Flow voids are maintained. Mild pannus. Upper cervical fusion across the C2 and C3 interspace may be degenerative in nature.  Post infusion, no abnormal enhancement of the brain or meninges. Extracranial soft tissues unremarkable. No acute sinus disease. Trace mastoid effusion. Negative orbits.  IMPRESSION: No evidence for intracranial metastatic disease.  Advanced atrophy and small vessel disease.   Electronically Signed   By: Rolla Flatten M.D.   On: 01/13/2014 12:03   Nm Pet Image Initial (pi) Skull Base To Thigh  01/12/2014   CLINICAL DATA:  Initial treatment strategy for pulmonary nodule.  EXAM: NUCLEAR MEDICINE PET SKULL BASE TO THIGH  TECHNIQUE: 6.1 mCi F-18 FDG was injected intravenously. Full-ring PET imaging was performed from the skull base to thigh after the radiotracer. CT data was obtained and used for attenuation correction and anatomic localization.  FASTING BLOOD GLUCOSE:  Value: 120 mg/dl  COMPARISON:  01/03/2014  FINDINGS: NECK  No hypermetabolic lymph nodes in the neck.  CHEST  Pulmonary nodule in the superior segment of right lower lobe measures 1.2 cm and has an SUV max equal to 3.9. Within the perihilar right lung there is a nodule or lymph node which measures approximately 1.3 cm and has an SUV max equal to 6.9. More centrally, there is a hypermetabolic right hilar lymph node within SUV max equal to 4.8. No hypermetabolic if see lateral or contralateral mediastinal adenopathy. There is no  hypermetabolic contralateral hilar adenopathy.  The heart size appears normal. There is no pericardial effusion. Calcified atherosclerotic disease involves the thoracic aorta. There are also calcifications involving the left circumflex and LAD coronary arteries. No axillary or supraclavicular adenopathy.  ABDOMEN/PELVIS  No abnormal uptake identified within the liver. The gallbladder is normal. The pancreas is unremarkable. Normal appearance of the spleen. The right adrenal gland appears normal. Mild nonspecific increased uptake identified within the left adrenal region. The SUV max is equal to 3.3.  No hypermetabolic lymph nodes identified within the abdomen or pelvis.  SKELETON  No focal hypermetabolic activity to suggest skeletal metastasis.  IMPRESSION: 1. Peripheral nodule in the right lower lobe exhibits malignant range FDG uptake and is worrisome for primary lung neoplasm. 2. Hypermetabolic  ipsilateral hilar lymph node metastasis. 3. Well-circumscribed hypermetabolic nodule within the right infrahilar region. This may represent a second pulmonary lesion or ipsilateral hilar lymph metastatic lymph node. 4. If the right infrahilar nodule is a separate pulmonary lesion then the stage would be considered T3 N1  M0 (IIIa). If the infrahilar nodule is a lymph node then this would be considered stage T1 N1 M0 (IIa). A contrast-enhanced CT of the chest may allow differentiation of this lesion. 5. Nonspecific FDG uptake associated with the left adrenal nodule. 6. Atherosclerotic disease including coronary artery calcifications 7. Emphysema.   Electronically Signed   By: Kerby Moors M.D.   On: 01/12/2014 15:06   CLINICAL DATA: Hematuria. Abnormal chest radiograph with possible new pulmonary nodule. History of BPH and renal calculi.  EXAM: CT CHEST WITHOUT CONTRAST; CT ABDOMEN AND PELVIS WITHOUT AND WITH CONTRAST  TECHNIQUE: Multidetector CT imaging of the chest was performed without intravenous contrast.  Multidetector CT imaging of the abdomen and pelvis was performed following the standard protocol before and during bolus administration of intravenous contrast.  CONTRAST: 125 ml Isovue-300.  COMPARISON: Chest radiographs 12/07/2003. Chest CT 07/24/2005.  FINDINGS: CT CHEST  Mediastinum: As evaluated in the noncontrast state, there is no evidence of mediastinal or axillary lymphadenopathy. Hilar assessment is limited. However, there is a new 1.3 x 1.2 cm nodular density projecting posterior to the right hilum on image 33, suspicious for a nodal metastasis. The thyroid gland, trachea and esophagus appear normal. The heart size is normal. There is diffuse atherosclerosis of the aorta, great vessels and coronary arteries.  Lungs/Pleura: There is no pleural or pericardial effusion.Moderate diffuse emphysematous changes are present within both lungs. The partially calcified subpleural scarring at the right apex is similar, measuring 3.1 x 2.3 cm transverse on image 5. Corresponding with new radiographic finding is a spiculated nodule in the superior segment of the right lower lobe, measuring 1.3 x 0.9 cm on image 29. Morphologically, this is highly suspicious for bronchogenic carcinoma. There are no other suspicious pulmonary nodules.  Musculoskeletal/Chest wall: No chest wall lesions or suspicious osseous findings demonstrated.  CT ABDOMEN AND PELVIS FINDINGS  Kidneys / Ureters / Bladder: Pre-contrast images demonstrate no renal, ureteral or bladder calculi. Multiple simple renal cysts are present bilaterally, the largest posteriorly in the mid left kidney, measuring 2.5 cm. The left kidney demonstrates several hyperdense lesions. The largest of these are within the upper pole, measuring 12 mm on image 14 of series 4 and in the lower pole, measuring 13 mm on image 30. There is no definite enhancement of these lesions following contrast. Delayed images result in segmental  visualization of the ureters. No urothelial abnormalities are identified. The bladder is trabeculated without apparent focal mucosal lesion.  Other Solid Abdominal Viscera: Small hepatic cysts are noted. No evidence of gallstones, gallbladder wall thickening or biliary dilatation. The pancreas appears normal. The spleen and adrenal glands appear normal.  Bowel/Mesentery: The stomach, small bowel and colon demonstrate no significant findings. The appendix is not clearly visualized. No ascites or peritoneal nodularity.  Retroperitoneum/Pelvis: There are no enlarged abdominal or pelvic lymph nodes. There is moderate atherosclerosis of the aorta, its branches and the iliac arteries. The prostate gland demonstrates mild central dystrophic calcification. There is a small left inguinal hernia containing primarily fat.  Bones / Musculoskeletal: No acute or significant osseus findings. No evidence of metastatic disease.  IMPRESSION: 1. The recently identified right pulmonary nodule corresponds with a spiculated lesion in the right lower lobe, morphologically concerning for bronchogenic carcinoma. There is new retrohilar nodularity on the right suspicious for nodal metastasis. No distant metastases identified. 2. Thoracic surgical consultation recommended. PET-CT may be helpful for further staging. 3. No specific explanation for hematuria. There are complex and simple renal cysts as  described. 4. Moderate atherosclerosis. 5. Small left inguinal hernia containing fat.   Electronically Signed By: Camie Patience M.D. On: 01/03/2014 10:16      Recent Lab Findings: No results found for this basename: WBC,  HGB,  HCT,  PLT,  GLUCOSE,  CHOL,  TRIG,  HDL,  LDLDIRECT,  LDLCALC,  ALT,  AST,  NA,  K,  CL,  CREATININE,  BUN,  CO2,  TSH,  INR,  GLUF,  HGBA1C   PFT"s  FEV1  1.3 61%  DLCO 42%    Assessment / Plan:   Patient with limited physical reserve , greater 50% of his time sitting  presents with multiple right lung lesion and mediastinal adenopathy suggestive on lung cancer. If two separate lesions left lower - lobe clinical stage Stage IIIa lung cancer (vs nodal mets). I have explained the likely diagnosis to patient and daughter. We will obtain a MRI of the brain ( now already done). The raw data from CT done in urology office ct is not adequate for navigational bx of lesions. Will repeat super D ct of the chest and see patient back early next week to determine bx strategy, poss ENB ( endobronchial navagation bronchoscopy)   I have explained to the patient and his wife and two daughters the need for repeat ct and if scan is done to proceed with  Bronchoscopy, EBUS, ENB to obtain tissue dx Wednesday  8/26   [name] MD      [address] [place] [address] Office [phone]   Beeper [phone]  01/16/2014 4:37 PM

## 2014-01-17 ENCOUNTER — Ambulatory Visit
Admission: RE | Admit: 2014-01-17 | Discharge: 2014-01-17 | Disposition: A | Discharge status: Home / Self Care | Payer: Medicare Other | Source: Ambulatory Visit | Attending: Cardiothoracic Surgery | Admitting: Cardiothoracic Surgery

## 2014-01-17 ENCOUNTER — Encounter (HOSPITAL_COMMUNITY): Payer: Self-pay | Admitting: *Deleted

## 2014-01-17 DIAGNOSIS — R911 Solitary pulmonary nodule: Secondary | ICD-10-CM

## 2014-01-18 ENCOUNTER — Ambulatory Visit (HOSPITAL_COMMUNITY)
Admission: RE | Admit: 2014-01-18 | Discharge: 2014-01-18 | Disposition: A | Discharge status: Home / Self Care | Payer: Medicare Other | Source: Ambulatory Visit | Attending: Cardiothoracic Surgery | Admitting: Cardiothoracic Surgery

## 2014-01-18 ENCOUNTER — Ambulatory Visit (HOSPITAL_COMMUNITY): Payer: Medicare Other | Admitting: Certified Registered"

## 2014-01-18 ENCOUNTER — Ambulatory Visit (HOSPITAL_COMMUNITY): Payer: Medicare Other

## 2014-01-18 ENCOUNTER — Encounter (HOSPITAL_COMMUNITY)
Admission: RE | Disposition: A | Discharge status: Home / Self Care | Payer: Self-pay | Source: Ambulatory Visit | Attending: Cardiothoracic Surgery

## 2014-01-18 ENCOUNTER — Encounter (HOSPITAL_COMMUNITY): Payer: Self-pay | Admitting: *Deleted

## 2014-01-18 ENCOUNTER — Encounter (HOSPITAL_COMMUNITY): Payer: Medicare Other | Admitting: Certified Registered"

## 2014-01-18 DIAGNOSIS — I10 Essential (primary) hypertension: Secondary | ICD-10-CM | POA: Insufficient documentation

## 2014-01-18 DIAGNOSIS — F17200 Nicotine dependence, unspecified, uncomplicated: Secondary | ICD-10-CM | POA: Insufficient documentation

## 2014-01-18 DIAGNOSIS — J449 Chronic obstructive pulmonary disease, unspecified: Secondary | ICD-10-CM | POA: Insufficient documentation

## 2014-01-18 DIAGNOSIS — Z79899 Other long term (current) drug therapy: Secondary | ICD-10-CM | POA: Diagnosis not present

## 2014-01-18 DIAGNOSIS — J984 Other disorders of lung: Secondary | ICD-10-CM | POA: Diagnosis present

## 2014-01-18 DIAGNOSIS — C7A1 Malignant poorly differentiated neuroendocrine tumors: Secondary | ICD-10-CM | POA: Diagnosis not present

## 2014-01-18 DIAGNOSIS — R222 Localized swelling, mass and lump, trunk: Secondary | ICD-10-CM

## 2014-01-18 DIAGNOSIS — J4489 Other specified chronic obstructive pulmonary disease: Secondary | ICD-10-CM | POA: Insufficient documentation

## 2014-01-18 DIAGNOSIS — R911 Solitary pulmonary nodule: Secondary | ICD-10-CM | POA: Diagnosis present

## 2014-01-18 HISTORY — PX: VIDEO BRONCHOSCOPY WITH ENDOBRONCHIAL NAVIGATION: SHX6175

## 2014-01-18 HISTORY — DX: Unilateral inguinal hernia, without obstruction or gangrene, not specified as recurrent: K40.90

## 2014-01-18 HISTORY — PX: VIDEO BRONCHOSCOPY WITH ENDOBRONCHIAL ULTRASOUND: SHX6177

## 2014-01-18 HISTORY — DX: Personal history of other medical treatment: Z92.89

## 2014-01-18 HISTORY — DX: Personal history of urinary calculi: Z87.442

## 2014-01-18 LAB — PROTIME-INR
INR: 1.06 (ref 0.00–1.49)
Prothrombin Time: 13.8 seconds (ref 11.6–15.2)

## 2014-01-18 LAB — COMPREHENSIVE METABOLIC PANEL
ALT: 18 U/L (ref 0–53)
AST: 31 U/L (ref 0–37)
Albumin: 3.8 g/dL (ref 3.5–5.2)
Alkaline Phosphatase: 88 U/L (ref 39–117)
Anion gap: 13 (ref 5–15)
BUN: 19 mg/dL (ref 6–23)
CO2: 26 mEq/L (ref 19–32)
Calcium: 9 mg/dL (ref 8.4–10.5)
Chloride: 99 mEq/L (ref 96–112)
Creatinine, Ser: 0.63 mg/dL (ref 0.50–1.35)
GFR calc Af Amer: >90 mL/min (ref >90)
GFR calc non Af Amer: >90 mL/min (ref >90)
Glucose, Bld: 112 mg/dL — ABNORMAL HIGH (ref 70–99)
Potassium: 4.5 mEq/L (ref 3.7–5.3)
Sodium: 138 mEq/L (ref 137–147)
Total Bilirubin: 0.4 mg/dL (ref 0.3–1.2)
Total Protein: 7.2 g/dL (ref 6.0–8.3)

## 2014-01-18 LAB — CBC
HCT: 46.4 % (ref 39.0–52.0)
Hemoglobin: 16.5 g/dL (ref 13.0–17.0)
MCH: 33.8 pg (ref 26.0–34.0)
MCHC: 35.6 g/dL (ref 30.0–36.0)
MCV: 95.1 fL (ref 78.0–100.0)
Platelets: 156 10*3/uL (ref 150–400)
RBC: 4.88 MIL/uL (ref 4.22–5.81)
RDW: 13.6 % (ref 11.5–15.5)
WBC: 6.6 10*3/uL (ref 4.0–10.5)

## 2014-01-18 LAB — APTT: aPTT: 28 seconds (ref 24–37)

## 2014-01-18 SURGERY — VIDEO BRONCHOSCOPY WITH ENDOBRONCHIAL NAVIGATION
Anesthesia: General

## 2014-01-18 MED ORDER — LACTATED RINGERS IV SOLN
INTRAVENOUS | Status: DC | PRN
  Administered 2014-01-18 (×2): via INTRAVENOUS

## 2014-01-18 MED ORDER — LIDOCAINE HCL (CARDIAC) 20 MG/ML IV SOLN
INTRAVENOUS | Status: DC | PRN
  Administered 2014-01-18: 20 mg via INTRAVENOUS

## 2014-01-18 MED ORDER — FENTANYL CITRATE 0.05 MG/ML IJ SOLN
INTRAMUSCULAR | Status: AC
  Filled 2014-01-18: qty 5

## 2014-01-18 MED ORDER — ALBUTEROL SULFATE (2.5 MG/3ML) 0.083% IN NEBU
INHALATION_SOLUTION | RESPIRATORY_TRACT | Status: AC
  Filled 2014-01-18: qty 3

## 2014-01-18 MED ORDER — 0.9 % SODIUM CHLORIDE (POUR BTL) OPTIME
TOPICAL | Status: DC | PRN
  Administered 2014-01-18: 1000 mL

## 2014-01-18 MED ORDER — FENTANYL CITRATE 0.05 MG/ML IJ SOLN
INTRAMUSCULAR | Status: DC | PRN
  Administered 2014-01-18: 50 ug via INTRAVENOUS
  Administered 2014-01-18: 25 ug via INTRAVENOUS

## 2014-01-18 MED ORDER — PROPOFOL 10 MG/ML IV BOLUS
INTRAVENOUS | Status: DC | PRN
  Administered 2014-01-18: 20 mg via INTRAVENOUS
  Administered 2014-01-18: 130 mg via INTRAVENOUS
  Administered 2014-01-18: 20 mg via INTRAVENOUS

## 2014-01-18 MED ORDER — DEXAMETHASONE SODIUM PHOSPHATE 10 MG/ML IJ SOLN
INTRAMUSCULAR | Status: DC | PRN
  Administered 2014-01-18: 10 mg via INTRAVENOUS

## 2014-01-18 MED ORDER — PROPOFOL 10 MG/ML IV BOLUS
INTRAVENOUS | Status: AC
  Filled 2014-01-18: qty 20

## 2014-01-18 MED ORDER — EPHEDRINE SULFATE 50 MG/ML IJ SOLN
INTRAMUSCULAR | Status: DC | PRN
  Administered 2014-01-18 (×4): 10 mg via INTRAVENOUS

## 2014-01-18 MED ORDER — NEOSTIGMINE METHYLSULFATE 10 MG/10ML IV SOLN
INTRAVENOUS | Status: DC | PRN
  Administered 2014-01-18: 3 mg via INTRAVENOUS

## 2014-01-18 MED ORDER — LIDOCAINE HCL 4 % MT SOLN
OROMUCOSAL | Status: DC | PRN
  Administered 2014-01-18: 4 mL via TOPICAL

## 2014-01-18 MED ORDER — ROCURONIUM BROMIDE 100 MG/10ML IV SOLN
INTRAVENOUS | Status: DC | PRN
  Administered 2014-01-18: 25 mg via INTRAVENOUS

## 2014-01-18 MED ORDER — LIDOCAINE HCL (PF) 1 % IJ SOLN
INTRAMUSCULAR | Status: AC
  Filled 2014-01-18: qty 30

## 2014-01-18 MED ORDER — ALBUTEROL SULFATE (2.5 MG/3ML) 0.083% IN NEBU
2.5000 mg | INHALATION_SOLUTION | Freq: Once | RESPIRATORY_TRACT | Status: AC
  Administered 2014-01-18: 2.5 mg via RESPIRATORY_TRACT

## 2014-01-18 MED ORDER — EPINEPHRINE HCL 1 MG/ML IJ SOLN
INTRAMUSCULAR | Status: AC
  Filled 2014-01-18: qty 1

## 2014-01-18 MED ORDER — ONDANSETRON HCL 4 MG/2ML IJ SOLN
INTRAMUSCULAR | Status: DC | PRN
  Administered 2014-01-18: 4 mg via INTRAVENOUS

## 2014-01-18 MED ORDER — GLYCOPYRROLATE 0.2 MG/ML IJ SOLN
INTRAMUSCULAR | Status: DC | PRN
  Administered 2014-01-18: 0.4 mg via INTRAVENOUS

## 2014-01-18 SURGICAL SUPPLY — 42 items
BALL CTTN LRG ABS STRL LF (GAUZE/BANDAGES/DRESSINGS)
BRUSH CYTOL CELLEBRITY 1.5X140 (MISCELLANEOUS) IMPLANT
BRUSH SUPERTRAX BIOPSY (INSTRUMENTS) ×1 IMPLANT
BRUSH SUPERTRAX NDL-TIP CYTO (INSTRUMENTS) ×1 IMPLANT
CANISTER SUCTION 2500CC (MISCELLANEOUS) ×4 IMPLANT
CHANNEL WORK EXTEND EDGE 180 (KITS) ×1 IMPLANT
CHANNEL WORK EXTEND EDGE 45 (KITS) IMPLANT
CHANNEL WORK EXTEND EDGE 90 (KITS) IMPLANT
CONT SPEC 4OZ CLIKSEAL STRL BL (MISCELLANEOUS) ×6 IMPLANT
COTTONBALL LRG STERILE PKG (GAUZE/BANDAGES/DRESSINGS) IMPLANT
COVER TABLE BACK 60X90 (DRAPES) ×4 IMPLANT
DRSG AQUACEL AG ADV 3.5X14 (GAUZE/BANDAGES/DRESSINGS) ×2 IMPLANT
FILTER STRAW FLUID ASPIR (MISCELLANEOUS) IMPLANT
FORCEPS BIOP RJ4 1.8 (CUTTING FORCEPS) IMPLANT
FORCEPS BIOP SUPERTRX PREMAR (INSTRUMENTS) ×1 IMPLANT
GAUZE SPONGE 4X4 12PLY STRL (GAUZE/BANDAGES/DRESSINGS) ×2 IMPLANT
GLOVE BIO SURGEON STRL SZ 6.5 (GLOVE) ×6 IMPLANT
KIT PROCEDURE EDGE 180 (KITS) IMPLANT
KIT PROCEDURE EDGE 45 (KITS) IMPLANT
KIT PROCEDURE EDGE 90 (KITS) ×1 IMPLANT
KIT ROOM TURNOVER OR (KITS) ×4 IMPLANT
MARKER SKIN DUAL TIP RULER LAB (MISCELLANEOUS) ×4 IMPLANT
NDL BIOPSY TRANSBRONCH 21G (NEEDLE) IMPLANT
NDL BLUNT 18X1 FOR OR ONLY (NEEDLE) IMPLANT
NDL SUPERTRX PREMARK BIOPSY (NEEDLE) IMPLANT
NEEDLE 22X1 1/2 (OR ONLY) (NEEDLE) IMPLANT
NEEDLE BIOPSY TRANSBRONCH 21G (NEEDLE) IMPLANT
NEEDLE BLUNT 18X1 FOR OR ONLY (NEEDLE) IMPLANT
NEEDLE SUPERTRX PREMARK BIOPSY (NEEDLE) IMPLANT
NEEDLE SYS SONOTIP II EBUSTBNA (NEEDLE) ×2 IMPLANT
NS IRRIG 1000ML POUR BTL (IV SOLUTION) ×4 IMPLANT
OIL SILICONE PENTAX (PARTS (SERVICE/REPAIRS)) ×3 IMPLANT
PAD ARMBOARD 7.5X6 YLW CONV (MISCELLANEOUS) ×8 IMPLANT
PATCHES PATIENT (LABEL) ×6 IMPLANT
SYR 20CC LL (SYRINGE) ×2 IMPLANT
SYR 20ML ECCENTRIC (SYRINGE) ×4 IMPLANT
SYR 5ML LUER SLIP (SYRINGE) ×2 IMPLANT
SYR CONTROL 10ML LL (SYRINGE) IMPLANT
TOWEL OR 17X24 6PK STRL BLUE (TOWEL DISPOSABLE) ×4 IMPLANT
TRAP SPECIMEN MUCOUS 40CC (MISCELLANEOUS) ×3 IMPLANT
TUBE CONNECTING 12X1/4 (SUCTIONS) ×2 IMPLANT
TUBE CONNECTING 20X1/4 (TUBING) ×2 IMPLANT

## 2014-01-18 NOTE — H&P (Signed)
[address]       [place] [address]             [phone]                    Patrick Carr Medical Record #[id number] Date of Birth: 29-Jun-1933  Referring: [name],* Primary Care: [name], MD  Chief Complaint:    Lung masses   History of Present Illness:    Patrick Carr 80 y.o. male is seen in the office  today for evaluation of abnormal chest ct and pet scan. Patient presents with 10-15 lb weight lass over 3 months. Patient is a long term and current smoker.  He was noted to have a elevated PSA and microscopic hematuria. He was referred to urology, a CT of chest abdomen pelvis was done. He has a distant history of rt upper lung lesion on ct 2007. Current chest xray and ct demonstrate new rt lung lesions. Patient has no hemoptysis . Denies any  previous cardiac history or current chest pain/ angina.   Patient is retired, previous worked in [employer]. 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   Zubrod Score: At the time of surgery this patient's most appropriate activity status/level should be described as: []     0    Normal activity, no symptoms []     1    Restricted in physical strenuous activity but ambulatory, able to do out light work [x]     2    Ambulatory and capable of self care, unable to do work activities, up and about               >50 % of waking hours                              []     3    Only limited self care, in bed greater than 50% of waking hours []     4    Completely disabled, no self care, confined to bed or chair []     5    Moribund   Past Medical History  Diagnosis Date  . Hypertension   . Hyperlipidemia   . BPH (benign prostatic  hyperplasia)   . Lesion of right lung     RLL  . RBBB     no palpations  . Renal cysts, acquired, bilateral   . History of kidney stones   . Hernia, inguinal left   . History of blood transfusion     artery in sinus bleed    Past Surgical History  Procedure Laterality Date  . Sinus artery surgery      not surgery  . No past surgeries      Family History  Problem Relation Age of Onset  . Heart disease Mother   . Cancer Father    Father died 68 with brain tumor, mother deceased  61 with heart failure     History  Smoking status  . Current Every Day Smoker -- 0.50 packs/day for 62 years  Smokeless tobacco  . Never Used    History  Alcohol Use No     Allergies  Allergen Reactions  . Other Other (See Comments)    Pneumonia vaccine causes arm swelling     No current facility-administered medications for this encounter.     Review of Systems:     Cardiac Review of Systems: Y or N  Chest Pain [ n   ]  Resting SOB [name]   ] Exertional SOB  [name]  ]  Orthopnea [ n ]   Pedal Edema [  n ]    Palpitations [nn  ] Syncope  [ n ]   Presyncope [n   ]  General Review of Systems: [Y] = yes [  ]=no Constitional: recent weight change Patrick Carr  ];  Wt loss over the last 3 months [   ] anorexia [  ]; fatigue [ y ]; nausea [  ]; night sweats [ nn ]; fever [  ]; or chills [  ];          Dental: poor dentition[  ]; Last Dentist visit:   Eye : blurred vision [  ]; diplopia [   ]; vision changes [  ];  Amaurosis fugax[  ]; Resp: cough [  ];  wheezing[  ];  hemoptysis[  ]; shortness of breath[  ]; paroxysmal nocturnal dyspnea[  ]; dyspnea on exertion[  ]; or orthopnea[  ];  GI:  gallstones[  ], vomiting[  ];  dysphagia[  ]; melena[  ];  hematochezia [  ]; heartburn[  ];   Hx of  Colonoscopy[  ]; GU: kidney stones [  ]; hematuria[  ];   dysuria [  ];  nocturia[  ];  history of     obstruction [  ]; urinary frequency [  ]             Skin: rash, swelling[  ];, hair loss[  ];  peripheral edema[  ];   or itching[  ]; Musculosketetal: myalgias[  ];  joint swelling[  ];  joint erythema[  ];  joint pain[  ];  back pain[  ];  Heme/Lymph: bruising[  ];  bleeding[  ];  anemia[  ];  Neuro: TIA[  ];  headaches[  ];  stroke[  ];  vertigo[  ];  seizures[  ];   paresthesias[  ];  difficulty walking[  ];  Psych:depression[  ]; anxiety[  ];  Endocrine: diabetes[  ];  thyroid dysfunction[  ];  Immunizations: Flu up to date [no  ]; Pneumococcal up to date [ 2004 ];  Other:  Physical Exam: BP 169/90  Pulse 91  Temp(Src) 97.9 F (36.6 C) (Oral)  Resp 16  Ht 5\' 5"  (1.651 m)  Wt 115 lb (52.164 kg)  BMI 19.14 kg/m2  SpO2 97%  PHYSICAL EXAMINATION:  General appearance: alert, cooperative, appears older than stated age and cachectic Neurologic: intact Heart: regular rate and rhythm, S1, S2 normal, no murmur, click, rub or gallop Lungs: diminished breath sounds bibasilar Abdomen: soft, non-tender; bowel sounds normal; no masses,  no organomegaly Extremities: extremities normal, atraumatic, no cyanosis or edema and Homans sign is negative, no sign of DVT +1 dp and pt pulses, no carotid bruites No cervical, axillary or supraclavicular adenopathy  Diagnostic Studies & Laboratory data:     Recent Radiology Findings:  Mr Patrick Carr Contrast  01/13/2014   CLINICAL DATA:  Lung nodule.  Staging for suspected lung cancer.  EXAM: MRI HEAD WITHOUT AND WITH CONTRAST  TECHNIQUE: Multiplanar, multiecho pulse sequences of the brain and surrounding structures were obtained  without and with intravenous contrast.  CONTRAST:  44mL MULTIHANCE GADOBENATE DIMEGLUMINE 529 MG/ML IV SOLN  COMPARISON:  None.  FINDINGS: No evidence for acute infarction, hemorrhage, mass lesion, hydrocephalus, or extra-axial fluid. Generalized cerebral and cerebellar atrophy. Advanced subcortical and periventricular T2 hyperintensities consistent with chronic microvascular ischemic change. Scattered lacunes. Flow voids are maintained. Mild  pannus. Upper cervical fusion across the C2 and C3 interspace may be degenerative in nature.  Post infusion, no abnormal enhancement of the brain or meninges. Extracranial soft tissues unremarkable. No acute sinus disease. Trace mastoid effusion. Negative orbits.  IMPRESSION: No evidence for intracranial metastatic disease.  Advanced atrophy and small vessel disease.   Electronically Signed   By: Rolla Flatten M.D.   On: 01/13/2014 12:03   Nm Pet Image Initial (pi) Skull Base To Thigh  01/12/2014   CLINICAL DATA:  Initial treatment strategy for pulmonary nodule.  EXAM: NUCLEAR MEDICINE PET SKULL BASE TO THIGH  TECHNIQUE: 6.1 mCi F-18 FDG was injected intravenously. Full-ring PET imaging was performed from the skull base to thigh after the radiotracer. CT data was obtained and used for attenuation correction and anatomic localization.  FASTING BLOOD GLUCOSE:  Value: 120 mg/dl  COMPARISON:  01/03/2014  FINDINGS: NECK  No hypermetabolic lymph nodes in the neck.  CHEST  Pulmonary nodule in the superior segment of right lower lobe measures 1.2 cm and has an SUV max equal to 3.9. Within the perihilar right lung there is a nodule or lymph node which measures approximately 1.3 cm and has an SUV max equal to 6.9. More centrally, there is a hypermetabolic right hilar lymph node within SUV max equal to 4.8. No hypermetabolic if see lateral or contralateral mediastinal adenopathy. There is no hypermetabolic contralateral hilar adenopathy.  The heart size appears normal. There is no pericardial effusion. Calcified atherosclerotic disease involves the thoracic aorta. There are also calcifications involving the left circumflex and LAD coronary arteries. No axillary or supraclavicular adenopathy.  ABDOMEN/PELVIS  No abnormal uptake identified within the liver. The gallbladder is normal. The pancreas is unremarkable. Normal appearance of the spleen. The right adrenal gland appears normal. Mild nonspecific increased uptake  identified within the left adrenal region. The SUV max is equal to 3.3.  No hypermetabolic lymph nodes identified within the abdomen or pelvis.  SKELETON  No focal hypermetabolic activity to suggest skeletal metastasis.  IMPRESSION: 1. Peripheral nodule in the right lower lobe exhibits malignant range FDG uptake and is worrisome for primary lung neoplasm. 2. Hypermetabolic  ipsilateral hilar lymph node metastasis. 3. Well-circumscribed hypermetabolic nodule within the right infrahilar region. This may represent a second pulmonary lesion or ipsilateral hilar lymph metastatic lymph node. 4. If the right infrahilar nodule is a separate pulmonary lesion then the stage would be considered T3 N1 M0 (IIIa). If the infrahilar nodule is a lymph node then this would be considered stage T1 N1 M0 (IIa). A contrast-enhanced CT of the chest may allow differentiation of this lesion. 5. Nonspecific FDG uptake associated with the left adrenal nodule. 6. Atherosclerotic disease including coronary artery calcifications 7. Emphysema.   Electronically Signed   By: Kerby Moors M.D.   On: 01/12/2014 15:06   CLINICAL DATA: Hematuria. Abnormal chest radiograph with possible new pulmonary nodule. History of BPH and renal calculi.  EXAM: CT CHEST WITHOUT CONTRAST; CT ABDOMEN AND PELVIS WITHOUT AND WITH CONTRAST  TECHNIQUE: Multidetector CT imaging of the chest was performed without intravenous contrast. Multidetector CT imaging of the abdomen and pelvis was performed  following the standard protocol before and during bolus administration of intravenous contrast.  CONTRAST: 125 ml Isovue-300.  COMPARISON: Chest radiographs 12/07/2003. Chest CT 07/24/2005.  FINDINGS: CT CHEST  Mediastinum: As evaluated in the noncontrast state, there is no evidence of mediastinal or axillary lymphadenopathy. Hilar assessment is limited. However, there is a new 1.3 x 1.2 cm nodular density projecting posterior to the right hilum on  image 33, suspicious for a nodal metastasis. The thyroid gland, trachea and esophagus appear normal. The heart size is normal. There is diffuse atherosclerosis of the aorta, great vessels and coronary arteries.  Lungs/Pleura: There is no pleural or pericardial effusion.Moderate diffuse emphysematous changes are present within both lungs. The partially calcified subpleural scarring at the right apex is similar, measuring 3.1 x 2.3 cm transverse on image 5. Corresponding with new radiographic finding is a spiculated nodule in the superior segment of the right lower lobe, measuring 1.3 x 0.9 cm on image 29. Morphologically, this is highly suspicious for bronchogenic carcinoma. There are no other suspicious pulmonary nodules.  Musculoskeletal/Chest wall: No chest wall lesions or suspicious osseous findings demonstrated.  CT ABDOMEN AND PELVIS FINDINGS  Kidneys / Ureters / Bladder: Pre-contrast images demonstrate no renal, ureteral or bladder calculi. Multiple simple renal cysts are present bilaterally, the largest posteriorly in the mid left kidney, measuring 2.5 cm. The left kidney demonstrates several hyperdense lesions. The largest of these are within the upper pole, measuring 12 mm on image 14 of series 4 and in the lower pole, measuring 13 mm on image 30. There is no definite enhancement of these lesions following contrast. Delayed images result in segmental visualization of the ureters. No urothelial abnormalities are identified. The bladder is trabeculated without apparent focal mucosal lesion.  Other Solid Abdominal Viscera: Small hepatic cysts are noted. No evidence of gallstones, gallbladder wall thickening or biliary dilatation. The pancreas appears normal. The spleen and adrenal glands appear normal.  Bowel/Mesentery: The stomach, small bowel and colon demonstrate no significant findings. The appendix is not clearly visualized. No ascites or peritoneal  nodularity.  Retroperitoneum/Pelvis: There are no enlarged abdominal or pelvic lymph nodes. There is moderate atherosclerosis of the aorta, its branches and the iliac arteries. The prostate gland demonstrates mild central dystrophic calcification. There is a small left inguinal hernia containing primarily fat.  Bones / Musculoskeletal: No acute or significant osseus findings. No evidence of metastatic disease.  IMPRESSION: 1. The recently identified right pulmonary nodule corresponds with a spiculated lesion in the right lower lobe, morphologically concerning for bronchogenic carcinoma. There is new retrohilar nodularity on the right suspicious for nodal metastasis. No distant metastases identified. 2. Thoracic surgical consultation recommended. PET-CT may be helpful for further staging. 3. No specific explanation for hematuria. There are complex and simple renal cysts as described. 4. Moderate atherosclerosis. 5. Small left inguinal hernia containing fat.   Electronically Signed By: Camie Patience M.D. On: 01/03/2014 10:16  Dg Chest 2 View  01/18/2014   CLINICAL DATA:  Preop for bronchoscopy.  Lung nodule.  EXAM: CHEST  2 VIEW  COMPARISON:  CT chest 01/17/2014 and 01/03/2014.  Chest 12/06/2013  FINDINGS: 12 mm pulmonary nodule again demonstrated in the right mid lung. No change since previous study. Heart size and pulmonary vascularity are normal. Peribronchial thickening suggesting chronic bronchitis. Emphysematous changes in the lungs. No focal consolidation or airspace disease. No blunting of costophrenic angles. No pneumothorax. Degenerative changes in the spine.  IMPRESSION: Indeterminate nodule in the right mid lung. Emphysematous changes and chronic  bronchitic changes. No focal consolidation.   Electronically Signed   By: Lucienne Capers M.D.   On: 01/18/2014 06:55   Ct Super D Chest Wo Contrast  01/17/2014   CLINICAL DATA:  Followup pulmonary nodule.  EXAM: CT CHEST WITHOUT  CONTRAST (super D chest)  TECHNIQUE: Multidetector CT imaging of the chest was performed using thin slice collimation for electromagnetic bronchoscopy planning purposes, without intravenous contrast.  COMPARISON:  PET-CT 01/12/2014 and CT scan 01/03/2014.  FINDINGS: The chest wall is stable. No supraclavicular or axillary mass or adenopathy. No worrisome bone lesions. Moderate osteoporosis.  The heart is normal in size. No pericardial effusion. Stable tortuosity, ectasia and calcification of the thoracic aorta. The esophagus is grossly normal.  Stable right hilar and infrahilar lymph nodes. These were metabolically active on the PET-CT.  The right lower lobe pulmonary nodule is stable. It measures approximately 12 by 7 mm.  No new lesions. Stable advanced emphysematous changes and dense apical scarring and calcification on the right.  The upper abdomen is unremarkable. Stable hyperdense/hemorrhagic renal cysts  IMPRESSION: Stable CT findings with a 12 mm superior segment right lower lobe pulmonary nodule and right hilar adenopathy.  Advanced emphysematous changes.   Electronically Signed   By: Kalman Jewels M.D.   On: 01/17/2014 16:01      Recent Lab Findings: Lab Results  Component Value Date   WBC 6.6 01/18/2014   PFT"s  FEV1  1.3 61%  DLCO 42%    Assessment / Plan:   Patient with limited physical reserve , greater 50% of his time sitting presents with multiple right lung lesion and mediastinal adenopathy suggestive on lung cancer. If two separate lesions left lower - lobe clinical stage Stage IIIa lung cancer (vs nodal mets). I have explained the likely diagnosis to patient and daughter.  The raw data from CT done in urology office ct is not adequate for navigational bx of lesions. Repeat super D ct of the chest done. I have explained to the patient and his wife and two daughters  Risks and options and expectations to  proceed with  Bronchoscopy, EBUS, ENB to obtain tissue dx today   The goals  risks and alternatives of the planned surgical procedure Bronchoscopy, EBUS, ENB have been discussed with the patient in detail. The risks of the procedure including death, infection, stroke, myocardial infarction, bleeding, blood transfusion have all been discussed specifically.  I have quoted [name] a 1% of perioperative mortality and a complication rate as high as 10 %. The patient's questions have been answered.Edwards Mckelvie is willing  to proceed with the planned procedure. [name] MD      [address] [place], [address] Office [phone]   Beeper [phone]  01/18/2014 7:37 AM

## 2014-01-18 NOTE — Progress Notes (Signed)
[address]       [name] [address]             [phone]                    Patrick Carr [facility] Medical Record #[id number] Date of Birth: Jul 18, 1933  Referring: [name],* Primary Care: [name], MD  Chief Complaint:    No chief complaint on file.   History of Present Illness:    Patrick Carr 80 y.o. male is seen in the office  today for evaluation of abnormal chest ct and pet scan. Patient presents with 10-15 lb weight lass over 3 months. Patient is a long term and current smoker.  He was noted to have a elevated PSA and microscopic hematuria. He was referred to urology, a CT of chest abdomen pelvis was done. He has a distant history of rt upper lung lesion on ct 2007. Current chest xray and ct demonstrate new rt lung lesions. Patient has no hemoptysis . Denies any  previous cardiac history or current chest pain/ angina.   Patient is retired, previous worked in [employer]. 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   Zubrod Score: At the time of surgery this patient's most appropriate activity status/level should be described as: []     0    Normal activity, no symptoms []     1    Restricted in physical strenuous activity but ambulatory, able to do out light work [x]     2    Ambulatory and capable of self care, unable to do work activities, up and about               >50 % of waking hours                              []     3    Only limited self care, in bed greater than 50% of waking hours []     4    Completely disabled, no self care, confined to bed or chair []     5    Moribund   Past Medical History  Diagnosis Date  . Hypertension   . Hyperlipidemia   . BPH (benign  prostatic hyperplasia)   . Lesion of right lung     RLL  . RBBB     no palpations  . Renal cysts, acquired, bilateral   . History of kidney stones   . Hernia, inguinal left   . History of blood transfusion     artery in sinus bleed    Past Surgical History  Procedure Laterality Date  . Sinus artery surgery      not surgery  . No past surgeries      Family History  Problem Relation Age of Onset  . Heart disease Mother   . Cancer Father    Father died 14 with brain tumor, mother deceased  95 with heart failure     History  Smoking status  . Current Every Day Smoker -- 0.50 packs/day for 62 years  Smokeless tobacco  . Never Used    History  Alcohol Use No  Allergies  Allergen Reactions  . Other Other (See Comments)    Pneumonia vaccine causes arm swelling     No current facility-administered medications for this encounter.     Review of Systems:     Cardiac Review of Systems: Y or N  Chest Pain [ n   ]  Resting SOB [name]   ] Exertional SOB  [name]  ]  Orthopnea [ n ]   Pedal Edema [  n ]    Palpitations [nn  ] Syncope  [ n ]   Presyncope [n   ]  General Review of Systems: [Y] = yes [  ]=no Constitional: recent weight change [name]  ];  Wt loss over the last 3 months [   ] anorexia [  ]; fatigue [ y ]; nausea [  ]; night sweats [ nn ]; fever [  ]; or chills [  ];          Dental: poor dentition[  ]; Last Dentist visit:   Eye : blurred vision [  ]; diplopia [   ]; vision changes [  ];  Amaurosis fugax[  ]; Resp: cough [  ];  wheezing[  ];  hemoptysis[  ]; shortness of breath[  ]; paroxysmal nocturnal dyspnea[  ]; dyspnea on exertion[  ]; or orthopnea[  ];  GI:  gallstones[  ], vomiting[  ];  dysphagia[  ]; melena[  ];  hematochezia [  ]; heartburn[  ];   Hx of  Colonoscopy[  ]; GU: kidney stones [  ]; hematuria[  ];   dysuria [  ];  nocturia[  ];  history of     obstruction [  ]; urinary frequency [  ]             Skin: rash, swelling[  ];, hair loss[  ];  peripheral  edema[  ];  or itching[  ]; Musculosketetal: myalgias[  ];  joint swelling[  ];  joint erythema[  ];  joint pain[  ];  back pain[  ];  Heme/Lymph: bruising[  ];  bleeding[  ];  anemia[  ];  Neuro: TIA[  ];  headaches[  ];  stroke[  ];  vertigo[  ];  seizures[  ];   paresthesias[  ];  difficulty walking[  ];  Psych:depression[  ]; anxiety[  ];  Endocrine: diabetes[  ];  thyroid dysfunction[  ];  Immunizations: Flu up to date [no  ]; Pneumococcal up to date [ 2004 ];  Other:  Physical Exam: BP 169/90  Pulse 91  Temp(Src) 97.9 F (36.6 C) (Oral)  Resp 16  Ht 5\' 5"  (1.651 m)  Wt 115 lb (52.164 kg)  BMI 19.14 kg/m2  SpO2 97%  PHYSICAL EXAMINATION:  General appearance: alert, cooperative, appears older than stated age and cachectic Neurologic: intact Heart: regular rate and rhythm, S1, S2 normal, no murmur, click, rub or gallop Lungs: diminished breath sounds bibasilar Abdomen: soft, non-tender; bowel sounds normal; no masses,  no organomegaly Extremities: extremities normal, atraumatic, no cyanosis or edema and Homans sign is negative, no sign of DVT +1 dp and pt pulses, no carotid bruites No cervical, axillary or supraclavicular adenopathy  Diagnostic Studies & Laboratory data:     Recent Radiology Findings:  Mr Patrick Carr Contrast  01/13/2014   CLINICAL DATA:  Lung nodule.  Staging for suspected lung cancer.  EXAM: MRI HEAD WITHOUT AND WITH CONTRAST  TECHNIQUE: Multiplanar, multiecho pulse sequences of the brain and surrounding structures  were obtained without and with intravenous contrast.  CONTRAST:  52mL MULTIHANCE GADOBENATE DIMEGLUMINE 529 MG/ML IV SOLN  COMPARISON:  None.  FINDINGS: No evidence for acute infarction, hemorrhage, mass lesion, hydrocephalus, or extra-axial fluid. Generalized cerebral and cerebellar atrophy. Advanced subcortical and periventricular T2 hyperintensities consistent with chronic microvascular ischemic change. Scattered lacunes. Flow voids are  maintained. Mild pannus. Upper cervical fusion across the C2 and C3 interspace may be degenerative in nature.  Post infusion, no abnormal enhancement of the brain or meninges. Extracranial soft tissues unremarkable. No acute sinus disease. Trace mastoid effusion. Negative orbits.  IMPRESSION: No evidence for intracranial metastatic disease.  Advanced atrophy and small vessel disease.   Electronically Signed   By: Rolla Flatten M.D.   On: 01/13/2014 12:03   Nm Pet Image Initial (pi) Skull Base To Thigh  01/12/2014   CLINICAL DATA:  Initial treatment strategy for pulmonary nodule.  EXAM: NUCLEAR MEDICINE PET SKULL BASE TO THIGH  TECHNIQUE: 6.1 mCi F-18 FDG was injected intravenously. Full-ring PET imaging was performed from the skull base to thigh after the radiotracer. CT data was obtained and used for attenuation correction and anatomic localization.  FASTING BLOOD GLUCOSE:  Value: 120 mg/dl  COMPARISON:  01/03/2014  FINDINGS: NECK  No hypermetabolic lymph nodes in the neck.  CHEST  Pulmonary nodule in the superior segment of right lower lobe measures 1.2 cm and has an SUV max equal to 3.9. Within the perihilar right lung there is a nodule or lymph node which measures approximately 1.3 cm and has an SUV max equal to 6.9. More centrally, there is a hypermetabolic right hilar lymph node within SUV max equal to 4.8. No hypermetabolic if see lateral or contralateral mediastinal adenopathy. There is no hypermetabolic contralateral hilar adenopathy.  The heart size appears normal. There is no pericardial effusion. Calcified atherosclerotic disease involves the thoracic aorta. There are also calcifications involving the left circumflex and LAD coronary arteries. No axillary or supraclavicular adenopathy.  ABDOMEN/PELVIS  No abnormal uptake identified within the liver. The gallbladder is normal. The pancreas is unremarkable. Normal appearance of the spleen. The right adrenal gland appears normal. Mild nonspecific  increased uptake identified within the left adrenal region. The SUV max is equal to 3.3.  No hypermetabolic lymph nodes identified within the abdomen or pelvis.  SKELETON  No focal hypermetabolic activity to suggest skeletal metastasis.  IMPRESSION: 1. Peripheral nodule in the right lower lobe exhibits malignant range FDG uptake and is worrisome for primary lung neoplasm. 2. Hypermetabolic  ipsilateral hilar lymph node metastasis. 3. Well-circumscribed hypermetabolic nodule within the right infrahilar region. This may represent a second pulmonary lesion or ipsilateral hilar lymph metastatic lymph node. 4. If the right infrahilar nodule is a separate pulmonary lesion then the stage would be considered T3 N1 M0 (IIIa). If the infrahilar nodule is a lymph node then this would be considered stage T1 N1 M0 (IIa). A contrast-enhanced CT of the chest may allow differentiation of this lesion. 5. Nonspecific FDG uptake associated with the left adrenal nodule. 6. Atherosclerotic disease including coronary artery calcifications 7. Emphysema.   Electronically Signed   By: Kerby Moors M.D.   On: 01/12/2014 15:06   CLINICAL DATA: Hematuria. Abnormal chest radiograph with possible new pulmonary nodule. History of BPH and renal calculi.  EXAM: CT CHEST WITHOUT CONTRAST; CT ABDOMEN AND PELVIS WITHOUT AND WITH CONTRAST  TECHNIQUE: Multidetector CT imaging of the chest was performed without intravenous contrast. Multidetector CT imaging of the abdomen and pelvis  was performed following the standard protocol before and during bolus administration of intravenous contrast.  CONTRAST: 125 ml Isovue-300.  COMPARISON: Chest radiographs 12/07/2003. Chest CT 07/24/2005.  FINDINGS: CT CHEST  Mediastinum: As evaluated in the noncontrast state, there is no evidence of mediastinal or axillary lymphadenopathy. Hilar assessment is limited. However, there is a new 1.3 x 1.2 cm nodular density projecting posterior to the  right hilum on image 33, suspicious for a nodal metastasis. The thyroid gland, trachea and esophagus appear normal. The heart size is normal. There is diffuse atherosclerosis of the aorta, great vessels and coronary arteries.  Lungs/Pleura: There is no pleural or pericardial effusion.Moderate diffuse emphysematous changes are present within both lungs. The partially calcified subpleural scarring at the right apex is similar, measuring 3.1 x 2.3 cm transverse on image 5. Corresponding with new radiographic finding is a spiculated nodule in the superior segment of the right lower lobe, measuring 1.3 x 0.9 cm on image 29. Morphologically, this is highly suspicious for bronchogenic carcinoma. There are no other suspicious pulmonary nodules.  Musculoskeletal/Chest wall: No chest wall lesions or suspicious osseous findings demonstrated.  CT ABDOMEN AND PELVIS FINDINGS  Kidneys / Ureters / Bladder: Pre-contrast images demonstrate no renal, ureteral or bladder calculi. Multiple simple renal cysts are present bilaterally, the largest posteriorly in the mid left kidney, measuring 2.5 cm. The left kidney demonstrates several hyperdense lesions. The largest of these are within the upper pole, measuring 12 mm on image 14 of series 4 and in the lower pole, measuring 13 mm on image 30. There is no definite enhancement of these lesions following contrast. Delayed images result in segmental visualization of the ureters. No urothelial abnormalities are identified. The bladder is trabeculated without apparent focal mucosal lesion.  Other Solid Abdominal Viscera: Small hepatic cysts are noted. No evidence of gallstones, gallbladder wall thickening or biliary dilatation. The pancreas appears normal. The spleen and adrenal glands appear normal.  Bowel/Mesentery: The stomach, small bowel and colon demonstrate no significant findings. The appendix is not clearly visualized. No ascites or peritoneal  nodularity.  Retroperitoneum/Pelvis: There are no enlarged abdominal or pelvic lymph nodes. There is moderate atherosclerosis of the aorta, its branches and the iliac arteries. The prostate gland demonstrates mild central dystrophic calcification. There is a small left inguinal hernia containing primarily fat.  Bones / Musculoskeletal: No acute or significant osseus findings. No evidence of metastatic disease.  IMPRESSION: 1. The recently identified right pulmonary nodule corresponds with a spiculated lesion in the right lower lobe, morphologically concerning for bronchogenic carcinoma. There is new retrohilar nodularity on the right suspicious for nodal metastasis. No distant metastases identified. 2. Thoracic surgical consultation recommended. PET-CT may be helpful for further staging. 3. No specific explanation for hematuria. There are complex and simple renal cysts as described. 4. Moderate atherosclerosis. 5. Small left inguinal hernia containing fat.   Electronically Signed By: Camie Patience M.D. On: 01/03/2014 10:16  Dg Chest 2 View  01/18/2014   CLINICAL DATA:  Preop for bronchoscopy.  Lung nodule.  EXAM: CHEST  2 VIEW  COMPARISON:  CT chest 01/17/2014 and 01/03/2014.  Chest 12/06/2013  FINDINGS: 12 mm pulmonary nodule again demonstrated in the right mid lung. No change since previous study. Heart size and pulmonary vascularity are normal. Peribronchial thickening suggesting chronic bronchitis. Emphysematous changes in the lungs. No focal consolidation or airspace disease. No blunting of costophrenic angles. No pneumothorax. Degenerative changes in the spine.  IMPRESSION: Indeterminate nodule in the right mid lung. Emphysematous changes  and chronic bronchitic changes. No focal consolidation.   Electronically Signed   By: Lucienne Capers M.D.   On: 01/18/2014 06:55   Ct Super D Chest Wo Contrast  01/17/2014   CLINICAL DATA:  Followup pulmonary nodule.  EXAM: CT CHEST WITHOUT  CONTRAST (super D chest)  TECHNIQUE: Multidetector CT imaging of the chest was performed using thin slice collimation for electromagnetic bronchoscopy planning purposes, without intravenous contrast.  COMPARISON:  PET-CT 01/12/2014 and CT scan 01/03/2014.  FINDINGS: The chest wall is stable. No supraclavicular or axillary mass or adenopathy. No worrisome bone lesions. Moderate osteoporosis.  The heart is normal in size. No pericardial effusion. Stable tortuosity, ectasia and calcification of the thoracic aorta. The esophagus is grossly normal.  Stable right hilar and infrahilar lymph nodes. These were metabolically active on the PET-CT.  The right lower lobe pulmonary nodule is stable. It measures approximately 12 by 7 mm.  No new lesions. Stable advanced emphysematous changes and dense apical scarring and calcification on the right.  The upper abdomen is unremarkable. Stable hyperdense/hemorrhagic renal cysts  IMPRESSION: Stable CT findings with a 12 mm superior segment right lower lobe pulmonary nodule and right hilar adenopathy.  Advanced emphysematous changes.   Electronically Signed   By: Kalman Jewels M.D.   On: 01/17/2014 16:01      Recent Lab Findings: Lab Results  Component Value Date   WBC 6.6 01/18/2014   PFT"s  FEV1  1.3 61%  DLCO 42%    Assessment / Plan:   Patient with limited physical reserve , greater 50% of his time sitting presents with multiple right lung lesion and mediastinal adenopathy suggestive on lung cancer. If two separate lesions left lower - lobe clinical stage Stage IIIa lung cancer (vs nodal mets). I have explained the likely diagnosis to patient and daughter.  The raw data from CT done in urology office ct is not adequate for navigational bx of lesions. Repeat super D ct of the chest done. I have explained to the patient and his wife and two daughters  Risks and options and expectations to  proceed with  Bronchoscopy, EBUS, ENB to obtain tissue dx today   The goals  risks and alternatives of the planned surgical procedure Bronchoscopy, EBUS, ENB have been discussed with the patient in detail. The risks of the procedure including death, infection, stroke, myocardial infarction, bleeding, blood transfusion have all been discussed specifically.  I have quoted [name] a 1% of perioperative mortality and a complication rate as high as 10 %. The patient's questions have been answered.Kabeer Hoagland is willing  to proceed with the planned procedure. [name] MD      [address] [place] [address] Office [phone]   Beeper [phone]  01/18/2014 7:33 AM

## 2014-01-18 NOTE — Brief Op Note (Signed)
      [address]       [place] [address]             [phone]    01/18/2014  11:04 AM  PATIENT:  Iona Beard Konicek  80 y.o. male  PRE-OPERATIVE DIAGNOSIS:  Lung nodule [793.11]  POST-OPERATIVE DIAGNOSIS:  lung nodule   PROCEDURE:  Procedure(s): VIDEO BRONCHOSCOPY WITH ENDOBRONCHIAL NAVIGATION (N/A) VIDEO BRONCHOSCOPY WITH ENDOBRONCHIAL ULTRASOUND (N/A)  SURGEON:  Surgeon(s) and Role:    * [name], MD - Primary   ANESTHESIA:   general  EBL:  Total I/O In: 1000 [I.V.:1000] Out: -   BLOOD ADMINISTERED:none  DRAINS: none   LOCAL MEDICATIONS USED:  NONE  SPECIMEN:  Source of Specimen:  10 R nodes, Targett #2 right lower lobe  DISPOSITION OF SPECIMEN:  PATHOLOGY  COUNTS:  YES   DICTATION: .Dragon Dictation  PLAN OF CARE: Discharge to home after PACU  PATIENT DISPOSITION:  PACU - hemodynamically stable.   Delay start of Pharmacological VTE agent (>24hrs) due to surgical blood loss or risk of bleeding: yes

## 2014-01-18 NOTE — Anesthesia Procedure Notes (Signed)
Procedure Name: Intubation Date/Time: 01/18/2014 8:44 AM Performed by: Manuela Schwartz B Pre-anesthesia Checklist: Patient identified, Emergency Drugs available, Suction available, Patient being monitored and Timeout performed Patient Re-evaluated:Patient Re-evaluated prior to inductionOxygen Delivery Method: Circle system utilized Preoxygenation: Pre-oxygenation with 100% oxygen Intubation Type: IV induction Ventilation: Mask ventilation without difficulty Laryngoscope Size: Mac and 3 Grade View: Grade I Tube type: Oral Number of attempts: 1 Airway Equipment and Method: Stylet and LTA kit utilized Placement Confirmation: ETT inserted through vocal cords under direct vision,  positive ETCO2 and breath sounds checked- equal and bilateral Secured at: 21 cm Tube secured with: Tape Dental Injury: Teeth and Oropharynx as per pre-operative assessment

## 2014-01-18 NOTE — Transfer of Care (Signed)
Immediate Anesthesia Transfer of Care Note  Patient: Patrick Carr  Procedure(s) Performed: Procedure(s): VIDEO BRONCHOSCOPY WITH ENDOBRONCHIAL NAVIGATION (N/A) VIDEO BRONCHOSCOPY WITH ENDOBRONCHIAL ULTRASOUND (N/A)  Patient Location: PACU  Anesthesia Type:General  Level of Consciousness: awake, alert  and patient cooperative  Airway & Oxygen Therapy: Patient Spontanous Breathing  Post-op Assessment: Report given to PACU RN and Post -op Vital signs reviewed and stable  Post vital signs: Reviewed and stable  Complications: No apparent anesthesia complications

## 2014-01-18 NOTE — Discharge Instructions (Signed)
Flexible Bronchoscopy Bronchoscopy is a procedure used to examine the passageways in the lungs. During the procedure a thin, flexible tool with a lens and camera or eyepiece is passed in your mouth or nose, down the windpipe (trachea), and into the air tubes (bronchi). This tool allows your health care provider to carefully look at your lungs from the inside and take diagnostic samples if needed.  LET [facility] CARE PROVIDER KNOW ABOUT:   Allergies to food or medicine.   All medicines you are taking, including blood thinners, vitamins, herbs, eye drops, creams, and over-the-counter medicines.   Previous problems you or members of your family have had with the use of anesthetics.   Any blood disorders you have.   Previous surgeries you have had.   Medical conditions you have, including heart disease, diabetes, or kidney problems.   Possibility of pregnancy, if this applies. RISKS AND COMPLICATIONS Generally, this is a safe procedure. However, as with any procedure, problems can occur. Possible problems include:   Collapsed lung (pneumothorax).  Bleeding.  Increased need for oxygen or difficulty breathing after the procedure. BEFORE THE PROCEDURE  Do not eat or drink anything after midnight on the night before the procedure or as directed by your health care provider.  PROCEDURE   Relax as much as possible during the procedure.  Medicines may be given to relax you, dry up your secretions, and control coughing.   A numbing medicine (local anesthetic) will be given to numb your mouth, nose, throat, and voice box (larynx). You will be able to breath normally during the procedure.   Samples of airway secretions may be collected for testing.  If abnormal areas are seen in your airways, tissue samples may be taken for examination under a microscope (biopsy).  If tissue samples are needed from the outer portions of the lung, a type of X-ray called fluoroscopy may be done.    If bleeding occurs, a drug may be used to stop or decrease the bleeding.  AFTER THE PROCEDURE   You may receive a chest X-ray following the procedure. This is to make sure the lungs have not collapsed (pneumothorax).  Document Released: 05/09/2000 Document Revised: 09/26/2013 Document Reviewed: 01/14/2013 [facility] Patient Information 2015 [place], [place]. This information is not intended to replace advice given to you by your health care provider. Make sure you discuss any questions you have with your health care provider.

## 2014-01-18 NOTE — Anesthesia Preprocedure Evaluation (Addendum)
Anesthesia Evaluation  Patient identified by MRN, date of birth, ID band Patient awake    Reviewed: Allergy & Precautions, H&P , NPO status , Patient's Chart, lab work & pertinent test results  History of Anesthesia Complications Negative for: history of anesthetic complications  Airway Mallampati: I TM Distance: >3 FB Neck ROM: Full    Dental  (+) Edentulous Upper, Edentulous Lower One bottom tooth, otherwise edentulous:   Pulmonary COPDCurrent Smoker,  R lung lesion breath sounds clear to auscultation        Cardiovascular hypertension, Pt. on medications - anginaRhythm:Regular Rate:Normal     Neuro/Psych negative neurological ROS     GI/Hepatic negative GI ROS, Neg liver ROS,   Endo/Other  negative endocrine ROS  Renal/GU negative Renal ROS     Musculoskeletal   Abdominal   Peds  Hematology negative hematology ROS (+)   Anesthesia Other Findings   Reproductive/Obstetrics                         Anesthesia Physical Anesthesia Plan  ASA: III  Anesthesia Plan: General   Post-op Pain Management:    Induction: Intravenous  Airway Management Planned: Oral ETT  Additional Equipment:   Intra-op Plan:   Post-operative Plan: Extubation in OR  Informed Consent: I have reviewed the patients History and Physical, chart, labs and discussed the procedure including the risks, benefits and alternatives for the proposed anesthesia with the patient or authorized representative who has indicated his/her understanding and acceptance.   Dental advisory given  Plan Discussed with: Anesthesiologist, Surgeon and CRNA  Anesthesia Plan Comments: (Plan routine monitors, GETA)       Anesthesia Quick Evaluation

## 2014-01-18 NOTE — Anesthesia Postprocedure Evaluation (Signed)
  Anesthesia Post-op Note  Patient: Patrick Carr  Procedure(s) Performed: Procedure(s): VIDEO BRONCHOSCOPY WITH ENDOBRONCHIAL NAVIGATION (N/A) VIDEO BRONCHOSCOPY WITH ENDOBRONCHIAL ULTRASOUND (N/A)  Patient Location: PACU  Anesthesia Type:General  Level of Consciousness: awake, alert , oriented and patient cooperative  Airway and Oxygen Therapy: Patient Spontanous Breathing  Post-op Pain: none  Post-op Assessment: Post-op Vital signs reviewed, Patient's Cardiovascular Status Stable, Respiratory Function Stable, Patent Airway and Pain level controlled  Post-op Vital Signs: Reviewed and stable  Last Vitals:  Filed Vitals:   01/18/14 1307  BP: 126/70  Pulse:   Temp:   Resp: 20    Complications: No apparent anesthesia complications

## 2014-01-19 NOTE — Op Note (Signed)
NAME:  Patrick Carr, Patrick Carr NO.:  [id number]  MEDICAL RECORD NO.:  [id number]  LOCATION:  [facility]                         FACILITY:  [facility]  PHYSICIAN:  Lanelle Bal, MD    DATE OF BIRTH:  03-17-1934  DATE OF PROCEDURE:  01/19/2014 DATE OF DISCHARGE:  01/18/2014                              OPERATIVE REPORT   PREOPERATIVE DIAGNOSIS:  Right lower lobe lung lesions x2.  POSTOPERATIVE DIAGNOSIS:  Right lower lobe lung lesions x2 possible small cell by quick stain.  PROCEDURE PERFORMED: 1. Video bronchoscopy. 2. Endobronchial ultrasound with transbronchial biopsy of 10 R lymph     nodes.  Navigational bronchoscopy with biopsy of right lower lobe     lung lesion.  SURGEON:  Lanelle Bal, MD  BRIEF HISTORY:  The patient is a 80 year old male with continued smoking history with underlying COPD who presents with lung lesions.  On CT scan and PET scan, he has two separate lesions in the right lung, both hypermetabolic.  Target #1 is the more peripheral lesion.  Target #2 is a well circumscribed hypermetabolic lesion more hilar in position. There were also right hilar lymph nodes that were mildly hypermetabolic. The patient originally was noted to have increasing weight loss over the past several months and microscopic hematuria.  Biopsy of the lesions was recommended and the patient agreed.  Ultimately, had to repeat the CT scan  as his initial CT scan was done in conjunction with an abdominal CT scan by the urologist and was not suitable for navigational bronchoscopy.  Risks and options of biopsy were discussed with the patient.  He is agreeable and signed informed consent.  DESCRIPTION OF PROCEDURE:  Preoperatively, navigational plan for bronchoscopy was developed and loaded into the operating room software. The patient underwent general endotracheal anesthesia with a single- lumen endotracheal tube.  Appropriate time-out was performed.  We initially proceeded with  video bronchoscopy and surveillance bronchoscopy on both the right and left tracheobronchial tree was carried out.  The patient did have some secretions.  There were no specific endobronchial lesions appreciated.  We then placed the EBUS scope.  Areas of lymph node approximating the 10 R lymph node station were identified.  The patient's preoperative CT scan had suggested mild hypermetabolic activity in this level.  Three passes with needle biopsy were performed and submitted to Pathology.  Initial quick stain on this showed lymphoid tissue but without tumor.  The scope was then removed and we transitioned to a navigational bronchoscopy equipment.  The target #1 was the more peripherally located lesion in the right lower lung.  Two pathways to this had been developed in the software but the patient did not have good airways to this area.  We attempted both pathways and never could get to a satisfactory position with the edge 90 degree catheter.  We then changed our target and when after the more medial target in the right lower lobe, we were able to get good position on biopsying this lesion.  Needle brush, needle aspiration, and biopsies were obtained.  The initial quick stain showed malignant cells suggestive of small cell.  The remaining biopsy tissues were left for permanent section.  After  obtaining this, we then changed to an 180 degree catheter and again attempted to biopsy target #1 but were unsuccessful.  Fluoroscopy was used during the procedure.  During the biopsies and at the completion of the procedure, fluoroscopy over the apex of both lungs showed no evidence of pneumothorax.  The patient was extubated in the operating room and transferred to the recovery room having tolerated the procedure without obvious complication.     Lanelle Bal, MD     EG/MEDQ  D:  01/19/2014  T:  01/19/2014  Job:  [id number]

## 2014-01-20 ENCOUNTER — Encounter (HOSPITAL_COMMUNITY): Payer: Self-pay | Admitting: Cardiothoracic Surgery

## 2014-01-20 LAB — CULTURE, RESPIRATORY W GRAM STAIN

## 2014-01-24 ENCOUNTER — Encounter: Payer: Self-pay | Admitting: *Deleted

## 2014-01-24 NOTE — [facility] Oncology Navigator Note (Signed)
Late entry - 01/23/14 Called patient to notify him of his appointment with [facility] 01/26/14 at 12:30 PM.  Directions given and questions answered.

## 2014-01-26 ENCOUNTER — Telehealth: Payer: Self-pay | Admitting: Internal Medicine

## 2014-01-26 ENCOUNTER — Telehealth: Payer: Self-pay | Admitting: *Deleted

## 2014-01-26 ENCOUNTER — Ambulatory Visit
Admission: RE | Admit: 2014-01-26 | Discharge: 2014-01-26 | Disposition: A | Discharge status: Home / Self Care | Payer: Medicare Other | Source: Ambulatory Visit | Attending: Radiation Oncology | Admitting: Radiation Oncology

## 2014-01-26 ENCOUNTER — Ambulatory Visit: Payer: Medicare Other | Attending: Radiation Oncology | Admitting: Physical Therapy

## 2014-01-26 ENCOUNTER — Encounter: Payer: Self-pay | Admitting: *Deleted

## 2014-01-26 ENCOUNTER — Other Ambulatory Visit: Payer: Self-pay | Admitting: Medical Oncology

## 2014-01-26 ENCOUNTER — Encounter: Payer: Self-pay | Admitting: Radiation Oncology

## 2014-01-26 ENCOUNTER — Ambulatory Visit (HOSPITAL_BASED_OUTPATIENT_CLINIC_OR_DEPARTMENT_OTHER): Payer: Medicare Other | Admitting: Internal Medicine

## 2014-01-26 ENCOUNTER — Other Ambulatory Visit (HOSPITAL_BASED_OUTPATIENT_CLINIC_OR_DEPARTMENT_OTHER): Payer: Medicare Other

## 2014-01-26 ENCOUNTER — Encounter: Payer: Medicare Other | Admitting: *Deleted

## 2014-01-26 VITALS — BP 106/63 | HR 73 | Temp 98.2°F | Resp 18 | Ht 65.0 in | Wt 118.5 lb

## 2014-01-26 VITALS — BP 106/63 | HR 73 | Temp 98.2°F | Resp 16

## 2014-01-26 DIAGNOSIS — IMO0001 Reserved for inherently not codable concepts without codable children: Secondary | ICD-10-CM | POA: Diagnosis not present

## 2014-01-26 DIAGNOSIS — C3430 Malignant neoplasm of lower lobe, unspecified bronchus or lung: Secondary | ICD-10-CM

## 2014-01-26 DIAGNOSIS — R293 Abnormal posture: Secondary | ICD-10-CM | POA: Diagnosis not present

## 2014-01-26 DIAGNOSIS — R911 Solitary pulmonary nodule: Secondary | ICD-10-CM

## 2014-01-26 DIAGNOSIS — C3491 Malignant neoplasm of unspecified part of right bronchus or lung: Secondary | ICD-10-CM

## 2014-01-26 LAB — CBC WITH DIFFERENTIAL/PLATELET
BASO%: 0.5 % (ref 0.0–2.0)
BASOS ABS: 0 10*3/uL (ref 0.0–0.1)
EOS ABS: 0.1 10*3/uL (ref 0.0–0.5)
EOS%: 0.8 % (ref 0.0–7.0)
HCT: 46.2 % (ref 38.4–49.9)
HEMOGLOBIN: 15.2 g/dL (ref 13.0–17.1)
LYMPH%: 15.6 % (ref 14.0–49.0)
MCH: 32.6 pg (ref 27.2–33.4)
MCHC: 32.9 g/dL (ref 32.0–36.0)
MCV: 99.2 fL — AB (ref 79.3–98.0)
MONO#: 0.6 10*3/uL (ref 0.1–0.9)
MONO%: 10 % (ref 0.0–14.0)
NEUT%: 73.1 % (ref 39.0–75.0)
NEUTROS ABS: 4.6 10*3/uL (ref 1.5–6.5)
Platelets: 160 10*3/uL (ref 140–400)
RBC: 4.65 10*6/uL (ref 4.20–5.82)
RDW: 13.4 % (ref 11.0–14.6)
WBC: 6.3 10*3/uL (ref 4.0–10.3)
lymph#: 1 10*3/uL (ref 0.9–3.3)

## 2014-01-26 LAB — COMPREHENSIVE METABOLIC PANEL (CC13)
ALBUMIN: 3.8 g/dL (ref 3.5–5.0)
ALT: 23 U/L (ref 0–55)
ANION GAP: 9 meq/L (ref 3–11)
AST: 25 U/L (ref 5–34)
Alkaline Phosphatase: 85 U/L (ref 40–150)
BUN: 17.6 mg/dL (ref 7.0–26.0)
CO2: 32 meq/L — AB (ref 22–29)
Calcium: 9.5 mg/dL (ref 8.4–10.4)
Chloride: 101 mEq/L (ref 98–109)
Creatinine: 0.8 mg/dL (ref 0.7–1.3)
GLUCOSE: 130 mg/dL (ref 70–140)
POTASSIUM: 4 meq/L (ref 3.5–5.1)
SODIUM: 141 meq/L (ref 136–145)
TOTAL PROTEIN: 6.9 g/dL (ref 6.4–8.3)
Total Bilirubin: 0.5 mg/dL (ref 0.20–1.20)

## 2014-01-26 NOTE — Telephone Encounter (Signed)
gv pt appt schedule for sept/oct. 1st  tx to start 9/15 no availability 9/14. message to MM.

## 2014-01-26 NOTE — Telephone Encounter (Signed)
Per staff phone call and POF I have schedueld appts. Scheduler advised of appts.  JMW  

## 2014-01-26 NOTE — Progress Notes (Signed)
Patrick Carr Clinical Social Work  Clinical Social Work met with patient/family and Futures trader at [facility] appointment to offer support and assess for psychosocial needs.  Medical oncologist reviewed patient's diagnosis and recommended treatment plan with patient/family.  Patrick Carr was accompanied by his spouse and daughters Patrick Carr and Patrick Carr.  Patrick Carr was agreeable to the treatment plan and had no concerns at this time.    Clinical Social Work briefly discussed Clinical Social Work role and [employer] support programs/services.  Clinical Social Work encouraged patient to call with any additional questions or concerns.   Polo Riley, MSW, LCSW, OSW-C Clinical Social Worker [facility] [phone]

## 2014-01-26 NOTE — Progress Notes (Signed)
   Thoracic Treatment Summary Name:Zayveon Miracle Date:01/26/2014 DOB:1934-04-01 Your Medical Team Medical Oncologist:Dr. Julien Nordmann Radiation Oncologist:Dr. [name] Pulmonologist:  Surgeon:Dr.Gerhardt Type and Stage of Lung Cancer  Small Cell Carcinoma:Limited Stage   Small cell carcinoma of lung   Primary site: Lung (Right)   Staging method: AJCC 7th Edition   Clinical: Stage IIA (T1a, N1, M0) signed by [name], MD on 01/26/2014  1:54 PM   Summary: Stage IIA (T1a, N1, M0)    Clinical stage is based on radiology exams.  Pathological stage will be determined after surgery.  Staging is based on the size of the tumor, involvement of lymph nodes or not, and whether or not the cancer center has spread. Recommendations Recommendations:  These recommendations are based on information available as of today's consult.  This is subject to change depending further testing or exams. Next Steps Next Step: Medical Oncology will set up follow up appointments.  Start date week of September 14th Radiation Oncology will set up follow up apptointments Barriers to Care What do you perceive as a potential barrier that may prevent you from receiving your treatment plan? Financial-will set up appointment with financial advocates at next visit to [facility]    Resources Given: [place] on [employer] at [email or web address] [phone] What to expect at [facility] information   Questions Norton Blizzard, RN BSN Thoracic Oncology Nurse Navigator at [place] is a nurse navigator that is available to assist you through your cancer journey.  She can answer your questions and/or provide resources regarding your treatment plan, emotional support, or financial concerns.

## 2014-01-26 NOTE — Progress Notes (Signed)
[place] [phone]   Fax:([phone] Multidisciplinary thoracic oncology clinic ([place])  CONSULT NOTE  REFERRING PHYSICIAN: Dr. Lanelle Bal.  REASON FOR CONSULTATION:  80 years old white male recently diagnosed with lung cancer.with a past medical history significant for hypertension, dyslipidemia, benign prostatic hypertrophy, RBBB, history of renal cyst and kidney stones as well as long history of smoking. The patient mentions that he has been complaining recently of weight loss for the last 3 months as well as persistent history of microscopic hematuria for over 4 years. He was also found to have elevated PSA. He was seen by Dr. Junious Silk, his urologist for evaluation. CT scan of the chest, abdomen and pelvis was performed on 01/03/2014 and it showed a new 1.3 x 1.2 cm nodular density projecting posterior to the right hilum suspicious for a nodal metastasis. The partially calcified subpleural scarring at the right apex is similar, measuring 3.1 x 2.3 cm transverse.  Corresponding with new radiographic finding is a spiculated nodule in the superior segment of the right lower lobe, measuring 1.3 x 0.9 cm. Morphologically, this is highly suspicious for bronchogenic Carcinoma. The patient was referred to Dr. Servando Snare and on 01/12/2014 he underwent a PET scan which showed Peripheral nodule in the right lower lobe exhibits malignant range FDG uptake and is worrisome for primary lung neoplasm. Hypermetabolic ipsilateral hilar lymph node metastasis. Well-circumscribed hypermetabolic nodule within the right infrahilar region. This may represent a second pulmonary lesion or ipsilateral hilar lymph metastatic lymph node. If the right infrahilar nodule is a separate pulmonary lesion then the stage would be considered T3 N1 M0 (IIIa). If the infrahilar nodule is a lymph node then this would be considered stage T1 N1 M0 (IIa).  MRI of the brain on 01/13/2014 showed no  evidence for intracranial metastatic disease. On 01/18/2014 the patient underwent bronchoscopy with endobronchial ultrasound and electromagnetic navigational bronchoscopy under the care of Dr. Servando Snare.  The final pathology (Accession: [id number]) showed high-grade poorly differentiated neuroendocrine carcinoma, small cell type. Dr. Servando Snare kindly referred the patient to me today for further evaluation and recommendation regarding treatment of his condition. When seen today he continues to have shortness of breath with exertion but no significant chest pain. He has cough productive of whitish sputum but no hemoptysis. He lost around 10-15 pounds over the last 12 months. He denied having any significant nausea or vomiting. He denied having any headache or blurry vision. Family history significant for mother with heart disease, father had brain tumor. 2 brothers had lung cancer. Another brother with mesothelioma and a brother with stomach cancer and one with prostate cancer.  The patient is married and has 2 daughters. He was accompanied today by his wife Patrick Carr and has 2 daughter Patrick Carr and Patrick Carr. Used to work in [employer] with exposure to asbestos. He has a history of smoking one pack per day for around 62 years and quit last week. No history of alcohol or drug abuse.   HPI Patrick Carr is a 80 y.o. male  HPI  Past Medical History  Diagnosis Date  . Hypertension   . Hyperlipidemia   . BPH (benign prostatic hyperplasia)   . Lesion of right lung     RLL  . RBBB     no palpations  . Renal cysts, acquired, bilateral   . History of kidney stones   . Hernia, inguinal left   . History of blood transfusion     artery in sinus bleed  Past Surgical History  Procedure Laterality Date  . Sinus artery surgery      not surgery  . No past surgeries    . Video bronchoscopy with endobronchial navigation N/A 01/18/2014    Procedure: VIDEO BRONCHOSCOPY WITH ENDOBRONCHIAL NAVIGATION;  Surgeon:  [name], MD;  Location: [facility] OR;  Service: Thoracic;  Laterality: N/A;  . Video bronchoscopy with endobronchial ultrasound N/A 01/18/2014    Procedure: VIDEO BRONCHOSCOPY WITH ENDOBRONCHIAL ULTRASOUND;  Surgeon: [name], MD;  Location: [facility] OR;  Service: Thoracic;  Laterality: N/A;    Family History  Problem Relation Age of Onset  . Heart disease Mother   . Cancer Father     Social History History  Substance Use Topics  . Smoking status: Current Every Day Smoker -- 0.50 packs/day for 62 years  . Smokeless tobacco: Never Used  . Alcohol Use: No    Allergies  Allergen Reactions  . Other Other (See Comments)    Pneumonia vaccine causes arm swelling     Current Outpatient Prescriptions  Medication Sig Dispense Refill  . alfuzosin (UROXATRAL) 10 MG 24 hr tablet Take 10 mg by mouth daily.      [name] amLODipine (NORVASC) 5 MG tablet Take 5 mg by mouth daily.      [name] aspirin 81 MG tablet Take 81 mg by mouth daily.      [name] atorvastatin (LIPITOR) 40 MG tablet Take 40 mg by mouth daily.      [name] losartan (COZAAR) 50 MG tablet Take 50 mg by mouth daily.      . tamsulosin (FLOMAX) 0.4 MG CAPS capsule Take 0.4 mg by mouth daily.      [name] ALPRAZolam (XANAX) 0.25 MG tablet Take 0.25 mg by mouth 3 (three) times daily as needed for anxiety.       No current facility-administered medications for this visit.    Review of Systems  Constitutional: positive for fatigue and weight loss Eyes: negative Ears, nose, mouth, throat, and face: negative Respiratory: positive for cough, dyspnea on exertion and sputum Cardiovascular: negative Gastrointestinal: negative Genitourinary:negative Integument/breast: negative Hematologic/lymphatic: negative Musculoskeletal:negative Neurological: negative Behavioral/Psych: negative Endocrine: negative Allergic/Immunologic: negative  Physical Exam  JKK:XFGHW, healthy, no distress, well nourished and well developed SKIN: skin color, texture,  turgor are normal, no rashes or significant lesions HEAD: Normocephalic, No masses, lesions, tenderness or abnormalities EYES: normal, PERRLA EARS: External ears normal, Canals clear OROPHARYNX:no exudate, no erythema and lips, buccal mucosa, and tongue normal  NECK: supple, no adenopathy, no JVD LYMPH:  no palpable lymphadenopathy, no hepatosplenomegaly LUNGS: clear to auscultation , and palpation HEART: regular rate & rhythm and no murmurs ABDOMEN:abdomen soft, non-tender, normal bowel sounds and no masses or organomegaly BACK: Back symmetric, no curvature., No CVA tenderness EXTREMITIES:no joint deformities, effusion, or inflammation, no edema, no skin discoloration, no clubbing  NEURO: alert & oriented x 3 with fluent speech, no focal motor/sensory deficits  PERFORMANCE STATUS: ECOG 1  LABORATORY DATA: Lab Results  Component Value Date   WBC 6.3 01/26/2014   HGB 15.2 01/26/2014   HCT 46.2 01/26/2014   MCV 99.2* 01/26/2014   PLT 160 01/26/2014      Chemistry      Component Value Date/Time   NA 141 01/26/2014 1225   NA 138 01/18/2014 0640   K 4.0 01/26/2014 1225   K 4.5 01/18/2014 0640   CL 99 01/18/2014 0640   CO2 32* 01/26/2014 1225   CO2 26 01/18/2014 0640   BUN 17.6  01/26/2014 1225   BUN 19 01/18/2014 0640   CREATININE 0.8 01/26/2014 1225   CREATININE 0.63 01/18/2014 0640   CREATININE 0.80 01/12/2014 1519      Component Value Date/Time   CALCIUM 9.5 01/26/2014 1225   CALCIUM 9.0 01/18/2014 0640   ALKPHOS 85 01/26/2014 1225   ALKPHOS 88 01/18/2014 0640   AST 25 01/26/2014 1225   AST 31 01/18/2014 0640   ALT 23 01/26/2014 1225   ALT 18 01/18/2014 0640   BILITOT 0.50 01/26/2014 1225   BILITOT 0.4 01/18/2014 0640       RADIOGRAPHIC STUDIES: Dg Chest 2 View  01/18/2014   CLINICAL DATA:  Preop for bronchoscopy.  Lung nodule.  EXAM: CHEST  2 VIEW  COMPARISON:  CT chest 01/17/2014 and 01/03/2014.  Chest 12/06/2013  FINDINGS: 12 mm pulmonary nodule again demonstrated in the right mid lung. No change  since previous study. Heart size and pulmonary vascularity are normal. Peribronchial thickening suggesting chronic bronchitis. Emphysematous changes in the lungs. No focal consolidation or airspace disease. No blunting of costophrenic angles. No pneumothorax. Degenerative changes in the spine.  IMPRESSION: Indeterminate nodule in the right mid lung. Emphysematous changes and chronic bronchitic changes. No focal consolidation.   Electronically Signed   By: Lucienne Capers M.D.   On: 01/18/2014 06:55   Mr Jeri Cos ZO Contrast  01/13/2014   CLINICAL DATA:  Lung nodule.  Staging for suspected lung cancer.  EXAM: MRI HEAD WITHOUT AND WITH CONTRAST  TECHNIQUE: Multiplanar, multiecho pulse sequences of the brain and surrounding structures were obtained without and with intravenous contrast.  CONTRAST:  14mL MULTIHANCE GADOBENATE DIMEGLUMINE 529 MG/ML IV SOLN  COMPARISON:  None.  FINDINGS: No evidence for acute infarction, hemorrhage, mass lesion, hydrocephalus, or extra-axial fluid. Generalized cerebral and cerebellar atrophy. Advanced subcortical and periventricular T2 hyperintensities consistent with chronic microvascular ischemic change. Scattered lacunes. Flow voids are maintained. Mild pannus. Upper cervical fusion across the C2 and C3 interspace may be degenerative in nature.  Post infusion, no abnormal enhancement of the brain or meninges. Extracranial soft tissues unremarkable. No acute sinus disease. Trace mastoid effusion. Negative orbits.  IMPRESSION: No evidence for intracranial metastatic disease.  Advanced atrophy and small vessel disease.   Electronically Signed   By: Rolla Flatten M.D.   On: 01/13/2014 12:03   Nm Pet Image Initial (pi) Skull Base To Thigh  01/12/2014   CLINICAL DATA:  Initial treatment strategy for pulmonary nodule.  EXAM: NUCLEAR MEDICINE PET SKULL BASE TO THIGH  TECHNIQUE: 6.1 mCi F-18 FDG was injected intravenously. Full-ring PET imaging was performed from the skull base to thigh  after the radiotracer. CT data was obtained and used for attenuation correction and anatomic localization.  FASTING BLOOD GLUCOSE:  Value: 120 mg/dl  COMPARISON:  01/03/2014  FINDINGS: NECK  No hypermetabolic lymph nodes in the neck.  CHEST  Pulmonary nodule in the superior segment of right lower lobe measures 1.2 cm and has an SUV max equal to 3.9. Within the perihilar right lung there is a nodule or lymph node which measures approximately 1.3 cm and has an SUV max equal to 6.9. More centrally, there is a hypermetabolic right hilar lymph node within SUV max equal to 4.8. No hypermetabolic if see lateral or contralateral mediastinal adenopathy. There is no hypermetabolic contralateral hilar adenopathy.  The heart size appears normal. There is no pericardial effusion. Calcified atherosclerotic disease involves the thoracic aorta. There are also calcifications involving the left circumflex and LAD coronary arteries. No  axillary or supraclavicular adenopathy.  ABDOMEN/PELVIS  No abnormal uptake identified within the liver. The gallbladder is normal. The pancreas is unremarkable. Normal appearance of the spleen. The right adrenal gland appears normal. Mild nonspecific increased uptake identified within the left adrenal region. The SUV max is equal to 3.3.  No hypermetabolic lymph nodes identified within the abdomen or pelvis.  SKELETON  No focal hypermetabolic activity to suggest skeletal metastasis.  IMPRESSION: 1. Peripheral nodule in the right lower lobe exhibits malignant range FDG uptake and is worrisome for primary lung neoplasm. 2. Hypermetabolic  ipsilateral hilar lymph node metastasis. 3. Well-circumscribed hypermetabolic nodule within the right infrahilar region. This may represent a second pulmonary lesion or ipsilateral hilar lymph metastatic lymph node. 4. If the right infrahilar nodule is a separate pulmonary lesion then the stage would be considered T3 N1 M0 (IIIa). If the infrahilar nodule is a lymph  node then this would be considered stage T1 N1 M0 (IIa). A contrast-enhanced CT of the chest may allow differentiation of this lesion. 5. Nonspecific FDG uptake associated with the left adrenal nodule. 6. Atherosclerotic disease including coronary artery calcifications 7. Emphysema.   Electronically Signed   By: Kerby Moors M.D.   On: 01/12/2014 15:06   Dg Chest Port 1 View  01/18/2014   CLINICAL DATA:  Status post bronchoscopy.  EXAM: PORTABLE CHEST - 1 VIEW  COMPARISON:  01/18/2014 head CT chest 01/17/2014.  FINDINGS: Patient is rotated. Heart size normal. Thoracic aorta is calcified. Biapical pleural parenchymal scarring, right greater than left. No definite pneumothorax after bronchoscopy. Right lower lobe nodule was better seen on 01/17/2014. No pleural fluid.  IMPRESSION: 1. No definite pneumothorax after bronchoscopy. 2. Right lower lobe nodule better visualized on 01/17/2014.   Electronically Signed   By: Lorin Picket M.D.   On: 01/18/2014 11:34   Ct Super D Chest Wo Contrast  01/17/2014   CLINICAL DATA:  Followup pulmonary nodule.  EXAM: CT CHEST WITHOUT CONTRAST (super D chest)  TECHNIQUE: Multidetector CT imaging of the chest was performed using thin slice collimation for electromagnetic bronchoscopy planning purposes, without intravenous contrast.  COMPARISON:  PET-CT 01/12/2014 and CT scan 01/03/2014.  FINDINGS: The chest wall is stable. No supraclavicular or axillary mass or adenopathy. No worrisome bone lesions. Moderate osteoporosis.  The heart is normal in size. No pericardial effusion. Stable tortuosity, ectasia and calcification of the thoracic aorta. The esophagus is grossly normal.  Stable right hilar and infrahilar lymph nodes. These were metabolically active on the PET-CT.  The right lower lobe pulmonary nodule is stable. It measures approximately 12 by 7 mm.  No new lesions. Stable advanced emphysematous changes and dense apical scarring and calcification on the right.  The upper  abdomen is unremarkable. Stable hyperdense/hemorrhagic renal cysts  IMPRESSION: Stable CT findings with a 12 mm superior segment right lower lobe pulmonary nodule and right hilar adenopathy.  Advanced emphysematous changes.   Electronically Signed   By: Kalman Jewels M.D.   On: 01/17/2014 16:01   Dg C-arm Bronchoscopy  01/18/2014   CLINICAL DATA: lung nodule   C-ARM BRONCHOSCOPY  Fluoroscopy was utilized by the requesting physician.  No radiographic  interpretation.     ASSESSMENT: This is a very pleasant 80 years old white male recently diagnosed with limited stage small cell lung cancer, stage IIA (T1a, N1, M0) presented with right lower lobe nodule in addition to right hilar lymphadenopathy diagnosed in August of 2015.   PLAN: I had a lengthy discussion with  the patient and his family today about his current disease stage, prognosis and treatment options. I recommended for the patient treatment with a course of concurrent chemoradiation with systemic chemotherapy in the form of cisplatin 60 mg/M2 on day 1 and etoposide 120 mg/M2 on days 1, 2 and 3 with Neulasta support on day 4. I discussed with the patient adverse effect of the chemotherapy including but not limited to alopecia, myelosuppression, nausea and vomiting, peripheral neuropathy, liver or renal dysfunction. I will arrange for the patient to have a chemotherapy education class before proceeding with the first cycle of his treatment. I expect the patient to start the first cycle of his treatment on 02/06/2014. I will call his pharmacy with prescription for Compazine 10 mg by mouth every 6 hours as needed for nausea. He would come back for followup visit in 3 weeks for reevaluation and management any adverse effect of his treatment. The patient will be seen later today by Dr. [name] for evaluation and discussion of the radiotherapy option. After completion of this course of concurrent chemoradiation the patient may benefit from  prophylactic cranial irradiation if he has complete or near complete response to this treatment. This will be discussed with him at that time. I gave the patient and his family the time to ask questions and I answered them completely to his satisfaction. The patient was seen during the multidisciplinary thoracic oncology clinic today by medical oncology, radiation oncology, thoracic navigator, social worker and physical therapist.  The patient voices understanding of current disease status and treatment options and is in agreement with the current care plan.  All questions were answered. The patient knows to call the clinic with any problems, questions or concerns. We can certainly see the patient much sooner if necessary.  Thank you so much for allowing me to participate in the care of Patrick Carr. I will continue to follow up the patient with you and assist in his care.  I spent 55 minutes counseling the patient face to face. The total time spent in the appointment was 80 minutes.  Disclaimer: This note was dictated with voice recognition software. Similar sounding words can inadvertently be transcribed and may not be corrected upon review.   Bengie Kaucher K. 01/26/2014, 2:00 PM

## 2014-01-27 ENCOUNTER — Other Ambulatory Visit: Payer: Self-pay | Admitting: *Deleted

## 2014-01-27 ENCOUNTER — Telehealth: Payer: Self-pay | Admitting: Internal Medicine

## 2014-01-27 NOTE — Telephone Encounter (Signed)
added FC appt to 9/10 appt. s/w pt he is aware of new time for 12pm.

## 2014-01-28 MED ORDER — PROCHLORPERAZINE MALEATE 10 MG PO TABS: 10.0000 mg | ORAL_TABLET | Freq: Four times a day (QID) | ORAL | Status: DC | PRN

## 2014-01-29 NOTE — Progress Notes (Signed)
Radiation Oncology         [phone]) [phone] ________________________________  Initial outpatient Consultation - Date: 01/26/2014   Name: Patrick Carr MRN: [id number]   DOB: [birth year]  REFERRING PHYSICIAN: [name], MD  STAGE: Small cell carcinoma of lung   Primary site: Lung (Right)   Staging method: AJCC 7th Edition   Clinical: Stage IIA (T1a, N1, M0) signed by [name], MD on 01/26/2014  1:54 PM   Summary: Stage IIA (T1a, N1, M0)   HISTORY OF PRESENT ILLNESS::Patrick Carr is a 80 y.o. male  Who presented with weight loss. A CT of the chest was performed which showed hilar adenopathy as well as some lung lesions.  A PET scan was performed on 01/12/14 which showed activity in a right infrahilar nodule as well as uptake in a nodule in the right lower lobe and the hilum. On 8/26 a biopsy was performed which showed small cell carcinoma. MRI of the brain was negative for metastatic disease. PET showed no evidence of metastatic disease as well. He is relatively asymptomatic. He has some dyspnea on exertion which is stable. He has a stable productive cough with white sputum but no hemoptysis. He is accompanied by his wife and daughters today. He smoked a pack of cigarettes per day for 62 years and quit last week. No previous history of cancer or radiation.  PREVIOUS RADIATION THERAPY: No  PAST MEDICAL HISTORY:  has a past medical history of Hypertension; Hyperlipidemia; BPH (benign prostatic hyperplasia); Lesion of right lung; RBBB; Renal cysts, acquired, bilateral; History of kidney stones; Hernia, inguinal (left ); and History of blood transfusion.    PAST SURGICAL HISTORY: Past Surgical History  Procedure Laterality Date  . Sinus artery surgery      not surgery  . No past surgeries    . Video bronchoscopy with endobronchial navigation N/A 01/18/2014    Procedure: VIDEO BRONCHOSCOPY WITH ENDOBRONCHIAL NAVIGATION;  Surgeon: [name], MD;  Location: [facility] OR;  Service:  Thoracic;  Laterality: N/A;  . Video bronchoscopy with endobronchial ultrasound N/A 01/18/2014    Procedure: VIDEO BRONCHOSCOPY WITH ENDOBRONCHIAL ULTRASOUND;  Surgeon: [name], MD;  Location: [place];  Service: Thoracic;  Laterality: N/A;    FAMILY HISTORY:  Family History  Problem Relation Age of Onset  . Heart disease Mother   . Cancer Father     SOCIAL HISTORY:  History  Substance Use Topics  . Smoking status: Current Every Day Smoker -- 0.50 packs/day for 62 years  . Smokeless tobacco: Never Used  . Alcohol Use: No    ALLERGIES: Other  MEDICATIONS:  Current Outpatient Prescriptions  Medication Sig Dispense Refill  . alfuzosin (UROXATRAL) 10 MG 24 hr tablet Take 10 mg by mouth daily.      [name] ALPRAZolam (XANAX) 0.25 MG tablet Take 0.25 mg by mouth 3 (three) times daily as needed for anxiety.      [name] amLODipine (NORVASC) 5 MG tablet Take 5 mg by mouth daily.      [name] aspirin 81 MG tablet Take 81 mg by mouth daily.      [name] atorvastatin (LIPITOR) 40 MG tablet Take 40 mg by mouth daily.      [name] losartan (COZAAR) 50 MG tablet Take 50 mg by mouth daily.      . prochlorperazine (COMPAZINE) 10 MG tablet Take 1 tablet (10 mg total) by mouth every 6 (six) hours as needed for nausea or vomiting.  30 tablet  0  . tamsulosin (  FLOMAX) 0.4 MG CAPS capsule Take 0.4 mg by mouth daily.       No current facility-administered medications for this encounter.    REVIEW OF SYSTEMS:  A 15 point review of systems is documented in the electronic medical record. This was obtained by the nursing staff. However, I reviewed this with the patient to discuss relevant findings and make appropriate changes.  Pertinent items are noted in HPI.  PHYSICAL EXAM:  Filed Vitals:   01/26/14 1411  BP: 106/63  Pulse: 73  Temp: 98.2 F (36.8 C)  Resp: 16  . . Appears stated age. No respiratory distress. Alert and oriented x 3.   LABORATORY DATA:  Lab Results  Component Value Date   WBC 6.3 01/26/2014   HGB  15.2 01/26/2014   HCT 46.2 01/26/2014   MCV 99.2* 01/26/2014   PLT 160 01/26/2014   Lab Results  Component Value Date   NA 141 01/26/2014   K 4.0 01/26/2014   CL 99 01/18/2014   CO2 32* 01/26/2014   Lab Results  Component Value Date   ALT 23 01/26/2014   AST 25 01/26/2014   ALKPHOS 85 01/26/2014   BILITOT 0.50 01/26/2014     RADIOGRAPHY: Dg Chest 2 View  01/18/2014   CLINICAL DATA:  Preop for bronchoscopy.  Lung nodule.  EXAM: CHEST  2 VIEW  COMPARISON:  CT chest 01/17/2014 and 01/03/2014.  Chest 12/06/2013  FINDINGS: 12 mm pulmonary nodule again demonstrated in the right mid lung. No change since previous study. Heart size and pulmonary vascularity are normal. Peribronchial thickening suggesting chronic bronchitis. Emphysematous changes in the lungs. No focal consolidation or airspace disease. No blunting of costophrenic angles. No pneumothorax. Degenerative changes in the spine.  IMPRESSION: Indeterminate nodule in the right mid lung. Emphysematous changes and chronic bronchitic changes. No focal consolidation.   Electronically Signed   By: Lucienne Capers M.D.   On: 01/18/2014 06:55   Mr Jeri Cos YO Contrast  01/13/2014   CLINICAL DATA:  Lung nodule.  Staging for suspected lung cancer.  EXAM: MRI HEAD WITHOUT AND WITH CONTRAST  TECHNIQUE: Multiplanar, multiecho pulse sequences of the brain and surrounding structures were obtained without and with intravenous contrast.  CONTRAST:  32mL MULTIHANCE GADOBENATE DIMEGLUMINE 529 MG/ML IV SOLN  COMPARISON:  None.  FINDINGS: No evidence for acute infarction, hemorrhage, mass lesion, hydrocephalus, or extra-axial fluid. Generalized cerebral and cerebellar atrophy. Advanced subcortical and periventricular T2 hyperintensities consistent with chronic microvascular ischemic change. Scattered lacunes. Flow voids are maintained. Mild pannus. Upper cervical fusion across the C2 and C3 interspace may be degenerative in nature.  Post infusion, no abnormal enhancement of the brain  or meninges. Extracranial soft tissues unremarkable. No acute sinus disease. Trace mastoid effusion. Negative orbits.  IMPRESSION: No evidence for intracranial metastatic disease.  Advanced atrophy and small vessel disease.   Electronically Signed   By: Rolla Flatten M.D.   On: 01/13/2014 12:03   Nm Pet Image Initial (pi) Skull Base To Thigh  01/12/2014   CLINICAL DATA:  Initial treatment strategy for pulmonary nodule.  EXAM: NUCLEAR MEDICINE PET SKULL BASE TO THIGH  TECHNIQUE: 6.1 mCi F-18 FDG was injected intravenously. Full-ring PET imaging was performed from the skull base to thigh after the radiotracer. CT data was obtained and used for attenuation correction and anatomic localization.  FASTING BLOOD GLUCOSE:  Value: 120 mg/dl  COMPARISON:  01/03/2014  FINDINGS: NECK  No hypermetabolic lymph nodes in the neck.  CHEST  Pulmonary nodule  in the superior segment of right lower lobe measures 1.2 cm and has an SUV max equal to 3.9. Within the perihilar right lung there is a nodule or lymph node which measures approximately 1.3 cm and has an SUV max equal to 6.9. More centrally, there is a hypermetabolic right hilar lymph node within SUV max equal to 4.8. No hypermetabolic if see lateral or contralateral mediastinal adenopathy. There is no hypermetabolic contralateral hilar adenopathy.  The heart size appears normal. There is no pericardial effusion. Calcified atherosclerotic disease involves the thoracic aorta. There are also calcifications involving the left circumflex and LAD coronary arteries. No axillary or supraclavicular adenopathy.  ABDOMEN/PELVIS  No abnormal uptake identified within the liver. The gallbladder is normal. The pancreas is unremarkable. Normal appearance of the spleen. The right adrenal gland appears normal. Mild nonspecific increased uptake identified within the left adrenal region. The SUV max is equal to 3.3.  No hypermetabolic lymph nodes identified within the abdomen or pelvis.  SKELETON   No focal hypermetabolic activity to suggest skeletal metastasis.  IMPRESSION: 1. Peripheral nodule in the right lower lobe exhibits malignant range FDG uptake and is worrisome for primary lung neoplasm. 2. Hypermetabolic  ipsilateral hilar lymph node metastasis. 3. Well-circumscribed hypermetabolic nodule within the right infrahilar region. This may represent a second pulmonary lesion or ipsilateral hilar lymph metastatic lymph node. 4. If the right infrahilar nodule is a separate pulmonary lesion then the stage would be considered T3 N1 M0 (IIIa). If the infrahilar nodule is a lymph node then this would be considered stage T1 N1 M0 (IIa). A contrast-enhanced CT of the chest may allow differentiation of this lesion. 5. Nonspecific FDG uptake associated with the left adrenal nodule. 6. Atherosclerotic disease including coronary artery calcifications 7. Emphysema.   Electronically Signed   By: Kerby Moors M.D.   On: 01/12/2014 15:06   Dg Chest Port 1 View  01/18/2014   CLINICAL DATA:  Status post bronchoscopy.  EXAM: PORTABLE CHEST - 1 VIEW  COMPARISON:  01/18/2014 head CT chest 01/17/2014.  FINDINGS: Patient is rotated. Heart size normal. Thoracic aorta is calcified. Biapical pleural parenchymal scarring, right greater than left. No definite pneumothorax after bronchoscopy. Right lower lobe nodule was better seen on 01/17/2014. No pleural fluid.  IMPRESSION: 1. No definite pneumothorax after bronchoscopy. 2. Right lower lobe nodule better visualized on 01/17/2014.   Electronically Signed   By: Lorin Picket M.D.   On: 01/18/2014 11:34   Ct Super D Chest Wo Contrast  01/17/2014   CLINICAL DATA:  Followup pulmonary nodule.  EXAM: CT CHEST WITHOUT CONTRAST (super D chest)  TECHNIQUE: Multidetector CT imaging of the chest was performed using thin slice collimation for electromagnetic bronchoscopy planning purposes, without intravenous contrast.  COMPARISON:  PET-CT 01/12/2014 and CT scan 01/03/2014.  FINDINGS:  The chest wall is stable. No supraclavicular or axillary mass or adenopathy. No worrisome bone lesions. Moderate osteoporosis.  The heart is normal in size. No pericardial effusion. Stable tortuosity, ectasia and calcification of the thoracic aorta. The esophagus is grossly normal.  Stable right hilar and infrahilar lymph nodes. These were metabolically active on the PET-CT.  The right lower lobe pulmonary nodule is stable. It measures approximately 12 by 7 mm.  No new lesions. Stable advanced emphysematous changes and dense apical scarring and calcification on the right.  The upper abdomen is unremarkable. Stable hyperdense/hemorrhagic renal cysts  IMPRESSION: Stable CT findings with a 12 mm superior segment right lower lobe pulmonary nodule and right  hilar adenopathy.  Advanced emphysematous changes.   Electronically Signed   By: Kalman Jewels M.D.   On: 01/17/2014 16:01   Dg C-arm Bronchoscopy  01/18/2014   CLINICAL DATA: lung nodule   C-ARM BRONCHOSCOPY  Fluoroscopy was utilized by the requesting physician.  No radiographic  interpretation.       IMPRESSION: Limited stage small cell lung cancer.  PLAN:We discussed the role of radiation in the treatment of lung cancer. We discussed the process of simulation and the placement of tattoos. We discussed 30 treatments as an outpatient. We discussed possible side effects including but not limited to dysphagia, fatigue, dehydration and damage to critical normal structures within the chest. I have set him up for simulation next week.  He will be receiving concurrent chemotherapy. We briefly discussed prophylactic cranial radiation following chemoradiation.   I spent 40 minutes  face to face with the patient and more than 50% of that time was spent in counseling and/or coordination of care.   ------------------------------------------------  Thea Silversmith, MD

## 2014-01-31 ENCOUNTER — Ambulatory Visit
Admission: RE | Admit: 2014-01-31 | Discharge: 2014-01-31 | Disposition: A | Discharge status: Home / Self Care | Payer: Medicare Other | Source: Ambulatory Visit | Attending: Radiation Oncology | Admitting: Radiation Oncology

## 2014-01-31 DIAGNOSIS — Z510 Encounter for antineoplastic radiation therapy: Secondary | ICD-10-CM | POA: Insufficient documentation

## 2014-01-31 DIAGNOSIS — C3491 Malignant neoplasm of unspecified part of right bronchus or lung: Secondary | ICD-10-CM

## 2014-01-31 DIAGNOSIS — C3490 Malignant neoplasm of unspecified part of unspecified bronchus or lung: Secondary | ICD-10-CM | POA: Insufficient documentation

## 2014-01-31 NOTE — Progress Notes (Signed)
[facility] Simulation and Treatment Planning Note   Name: Patrick Carr MRN: [id number]  Date: 01/31/2014  DOB: 1934-03-02  Status: outpatient    DIAGNOSIS: Stage II right lung cancer    SIDE: right   CONSENT VERIFIED: yes   SET UP AND IMMOBILIZATION: Patient is setup supine with arms in a wing board.   NARRATIVE: The patient was brought to the [facility].  Identity was confirmed.  All relevant records and images related to the planned course of therapy were reviewed.  Then, the patient was positioned in a stable reproducible clinical set-up for radiation therapy.  CT images were obtained.  Skin markings were placed.  The CT images were loaded into the planning software where the target and avoidance structures were contoured.  The radiation prescription was entered and confirmed.   TREATMENT PLANNING NOTE:  Treatment planning then occurred. I have requested 3D simulation with [facility] of the spinal cord, total lungs and gross tumor volume. I have also requested mlcs and an isodose plan.   Special treatment procedure will be performed as [name] will be receiving concurrent chemotherapy.   I have ordered a consult with the dietician for monitoring.  I will also be verifying that weekly lab values are appropriate.

## 2014-02-02 ENCOUNTER — Encounter: Payer: Self-pay | Admitting: Internal Medicine

## 2014-02-02 ENCOUNTER — Ambulatory Visit: Payer: Medicare Other

## 2014-02-02 ENCOUNTER — Other Ambulatory Visit: Payer: Medicare Other

## 2014-02-02 NOTE — Progress Notes (Signed)
Checked in new pt with no financial concerns at this time.  I informed pt that I will call his insurance company to see if prior Josem Kaufmann is required for chemo, if so I will obtain it.  Also informed pt of the different foundations that are available for copay assistance for chemo if needed.  I gave him my card for any future questions or concerns.

## 2014-02-03 DIAGNOSIS — Z510 Encounter for antineoplastic radiation therapy: Secondary | ICD-10-CM | POA: Diagnosis not present

## 2014-02-07 ENCOUNTER — Encounter: Payer: Self-pay | Admitting: *Deleted

## 2014-02-07 ENCOUNTER — Ambulatory Visit
Admission: RE | Admit: 2014-02-07 | Discharge: 2014-02-07 | Disposition: A | Discharge status: Home / Self Care | Payer: Medicare Other | Source: Ambulatory Visit | Attending: Radiation Oncology | Admitting: Radiation Oncology

## 2014-02-07 ENCOUNTER — Other Ambulatory Visit (HOSPITAL_BASED_OUTPATIENT_CLINIC_OR_DEPARTMENT_OTHER): Payer: Medicare Other

## 2014-02-07 ENCOUNTER — Ambulatory Visit: Payer: Medicare Other

## 2014-02-07 ENCOUNTER — Ambulatory Visit (HOSPITAL_BASED_OUTPATIENT_CLINIC_OR_DEPARTMENT_OTHER): Payer: Medicare Other

## 2014-02-07 VITALS — BP 113/61 | HR 76 | Temp 96.9°F

## 2014-02-07 DIAGNOSIS — C3430 Malignant neoplasm of lower lobe, unspecified bronchus or lung: Secondary | ICD-10-CM

## 2014-02-07 DIAGNOSIS — C3491 Malignant neoplasm of unspecified part of right bronchus or lung: Secondary | ICD-10-CM

## 2014-02-07 DIAGNOSIS — Z23 Encounter for immunization: Secondary | ICD-10-CM

## 2014-02-07 DIAGNOSIS — Z510 Encounter for antineoplastic radiation therapy: Secondary | ICD-10-CM | POA: Diagnosis not present

## 2014-02-07 DIAGNOSIS — Z5111 Encounter for antineoplastic chemotherapy: Secondary | ICD-10-CM

## 2014-02-07 LAB — CBC WITH DIFFERENTIAL/PLATELET
BASO%: 0.5 % (ref 0.0–2.0)
Basophils Absolute: 0 10*3/uL (ref 0.0–0.1)
EOS ABS: 0.1 10*3/uL (ref 0.0–0.5)
EOS%: 1.4 % (ref 0.0–7.0)
HCT: 43.1 % (ref 38.4–49.9)
HEMOGLOBIN: 14.2 g/dL (ref 13.0–17.1)
LYMPH#: 1 10*3/uL (ref 0.9–3.3)
LYMPH%: 18.3 % (ref 14.0–49.0)
MCH: 32.4 pg (ref 27.2–33.4)
MCHC: 33 g/dL (ref 32.0–36.0)
MCV: 98 fL (ref 79.3–98.0)
MONO#: 0.7 10*3/uL (ref 0.1–0.9)
MONO%: 12.9 % (ref 0.0–14.0)
NEUT%: 66.9 % (ref 39.0–75.0)
NEUTROS ABS: 3.6 10*3/uL (ref 1.5–6.5)
Platelets: 127 10*3/uL — ABNORMAL LOW (ref 140–400)
RBC: 4.39 10*6/uL (ref 4.20–5.82)
RDW: 13.4 % (ref 11.0–14.6)
WBC: 5.4 10*3/uL (ref 4.0–10.3)

## 2014-02-07 LAB — COMPREHENSIVE METABOLIC PANEL (CC13)
ALBUMIN: 3.5 g/dL (ref 3.5–5.0)
ALT: 17 U/L (ref 0–55)
AST: 21 U/L (ref 5–34)
Alkaline Phosphatase: 81 U/L (ref 40–150)
Anion Gap: 9 mEq/L (ref 3–11)
BUN: 19.3 mg/dL (ref 7.0–26.0)
CO2: 28 mEq/L (ref 22–29)
Calcium: 9.3 mg/dL (ref 8.4–10.4)
Chloride: 104 mEq/L (ref 98–109)
Creatinine: 0.8 mg/dL (ref 0.7–1.3)
GLUCOSE: 103 mg/dL (ref 70–140)
POTASSIUM: 4 meq/L (ref 3.5–5.1)
Sodium: 140 mEq/L (ref 136–145)
Total Bilirubin: 0.69 mg/dL (ref 0.20–1.20)
Total Protein: 6.7 g/dL (ref 6.4–8.3)

## 2014-02-07 LAB — MAGNESIUM (CC13): MAGNESIUM: 2.2 mg/dL (ref 1.5–2.5)

## 2014-02-07 MED ORDER — DEXAMETHASONE SODIUM PHOSPHATE 20 MG/5ML IJ SOLN
INTRAMUSCULAR | Status: AC
  Filled 2014-02-07: qty 5

## 2014-02-07 MED ORDER — POTASSIUM CHLORIDE 2 MEQ/ML IV SOLN
Freq: Once | INTRAVENOUS | Status: AC
  Administered 2014-02-07: 09:00:00 via INTRAVENOUS
  Filled 2014-02-07: qty 10

## 2014-02-07 MED ORDER — DEXAMETHASONE SODIUM PHOSPHATE 20 MG/5ML IJ SOLN
12.0000 mg | Freq: Once | INTRAMUSCULAR | Status: AC
  Administered 2014-02-07: 12 mg via INTRAVENOUS

## 2014-02-07 MED ORDER — SODIUM CHLORIDE 0.9 % IV SOLN
120.0000 mg/m2 | Freq: Once | INTRAVENOUS | Status: AC
  Administered 2014-02-07: 190 mg via INTRAVENOUS
  Filled 2014-02-07: qty 9.5

## 2014-02-07 MED ORDER — SODIUM CHLORIDE 0.9 % IV SOLN
Freq: Once | INTRAVENOUS | Status: AC
  Administered 2014-02-07: 09:00:00 via INTRAVENOUS

## 2014-02-07 MED ORDER — INFLUENZA VAC SPLIT QUAD 0.5 ML IM SUSY
0.5000 mL | PREFILLED_SYRINGE | Freq: Once | INTRAMUSCULAR | Status: AC
  Administered 2014-02-07: 0.5 mL via INTRAMUSCULAR
  Filled 2014-02-07: qty 0.5

## 2014-02-07 MED ORDER — PALONOSETRON HCL INJECTION 0.25 MG/5ML
INTRAVENOUS | Status: AC
  Filled 2014-02-07: qty 5

## 2014-02-07 MED ORDER — PALONOSETRON HCL INJECTION 0.25 MG/5ML
0.2500 mg | Freq: Once | INTRAVENOUS | Status: AC
  Administered 2014-02-07: 0.25 mg via INTRAVENOUS

## 2014-02-07 MED ORDER — SODIUM CHLORIDE 0.9 % IV SOLN
150.0000 mg | Freq: Once | INTRAVENOUS | Status: AC
  Administered 2014-02-07: 150 mg via INTRAVENOUS
  Filled 2014-02-07: qty 5

## 2014-02-07 MED ORDER — SODIUM CHLORIDE 0.9 % IV SOLN
60.0000 mg/m2 | Freq: Once | INTRAVENOUS | Status: AC
  Administered 2014-02-07: 94 mg via INTRAVENOUS
  Filled 2014-02-07: qty 94

## 2014-02-07 NOTE — Progress Notes (Signed)
  Radiation Oncology         [phone] ________________________________  Name: Patrick Carr MRN: [id number]  Date: 02/07/2014  DOB: 1933/06/18  Simulation Verification Note  Status: outpatient  NARRATIVE: The patient was brought to the treatment unit and placed in the planned treatment position. The clinical setup was verified. Then port films were obtained and uploaded to the radiation oncology medical record software.  The treatment beams were carefully compared against the planned radiation fields. The position location and shape of the radiation fields was reviewed. The targeted volume of tissue appears appropriately covered by the radiation beams. Organs at risk appear to be excluded as planned.  Based on my personal review, I approved the simulation verification. The patient's treatment will proceed as planned.  ------------------------------------------------  Thea Silversmith, MD

## 2014-02-07 NOTE — [facility] Oncology Navigator Note (Unsigned)
Spoke with patient and wife today in chemo room.  They both are very positive about Patrick Carr's treatment.  I asked if patient was continuing to quit smoking and he stated yes.  I encourage this effort and asked that he call me if he develops any cravings or starts to smoke again.

## 2014-02-07 NOTE — Patient Instructions (Signed)
[facility] Discharge Instructions for Patients Receiving Chemotherapy  Today you received the following chemotherapy agents cisplatin/etoposide  To help prevent nausea and vomiting after your treatment, we encourage you to take your nausea medication as directed.     If you develop nausea and vomiting that is not controlled by your nausea medication, call the clinic.   BELOW ARE SYMPTOMS THAT SHOULD BE REPORTED IMMEDIATELY:  *FEVER GREATER THAN 100.5 F  *CHILLS WITH OR WITHOUT FEVER  NAUSEA AND VOMITING THAT IS NOT CONTROLLED WITH YOUR NAUSEA MEDICATION  *UNUSUAL SHORTNESS OF BREATH  *UNUSUAL BRUISING OR BLEEDING  TENDERNESS IN MOUTH AND THROAT WITH OR WITHOUT PRESENCE OF ULCERS  *URINARY PROBLEMS  *BOWEL PROBLEMS  UNUSUAL RASH Items with * indicate a potential emergency and should be followed up as soon as possible.  Feel free to call the clinic you have any questions or concerns. The clinic phone number is [phone].   Cisplatin injection What is this medicine? CISPLATIN (SIS pla tin) is a chemotherapy drug. It targets fast dividing cells, like cancer cells, and causes these cells to die. This medicine is used to treat many types of cancer like bladder, ovarian, and testicular cancers. This medicine may be used for other purposes; ask your health care provider or pharmacist if you have questions. COMMON BRAND NAME(S): Platinol, Platinol -AQ What should I tell my health care provider before I take this medicine? They need to know if you have any of these conditions: -blood disorders -hearing problems -kidney disease -recent or ongoing radiation therapy -an unusual or allergic reaction to cisplatin, carboplatin, other chemotherapy, other medicines, foods, dyes, or preservatives -pregnant or trying to get pregnant -breast-feeding How should I use this medicine? This drug is given as an infusion into a vein. It is administered in a hospital or clinic  by a specially trained health care professional. Talk to your pediatrician regarding the use of this medicine in children. Special care may be needed. Overdosage: If you think you have taken too much of this medicine contact a poison control center or emergency room at once. NOTE: This medicine is only for you. Do not share this medicine with others. What if I miss a dose? It is important not to miss a dose. Call your doctor or health care professional if you are unable to keep an appointment. What may interact with this medicine? -dofetilide -foscarnet -medicines for seizures -medicines to increase blood counts like filgrastim, pegfilgrastim, sargramostim -probenecid -pyridoxine used with altretamine -rituximab -some antibiotics like amikacin, gentamicin, neomycin, polymyxin B, streptomycin, tobramycin -sulfinpyrazone -vaccines -zalcitabine Talk to your doctor or health care professional before taking any of these medicines: -acetaminophen -aspirin -ibuprofen -ketoprofen -naproxen This list may not describe all possible interactions. Give your health care provider a list of all the medicines, herbs, non-prescription drugs, or dietary supplements you use. Also tell them if you smoke, drink alcohol, or use illegal drugs. Some items may interact with your medicine. What should I watch for while using this medicine? Your condition will be monitored carefully while you are receiving this medicine. You will need important blood work done while you are taking this medicine. This drug may make you feel generally unwell. This is not uncommon, as chemotherapy can affect healthy cells as well as cancer cells. Report any side effects. Continue your course of treatment even though you feel ill unless your doctor tells you to stop. In some cases, you may be given additional medicines to help with side effects.  Follow all directions for their use. Call your doctor or health care professional for  advice if you get a fever, chills or sore throat, or other symptoms of a cold or flu. Do not treat yourself. This drug decreases your body's ability to fight infections. Try to avoid being around people who are sick. This medicine may increase your risk to bruise or bleed. Call your doctor or health care professional if you notice any unusual bleeding. Be careful brushing and flossing your teeth or using a toothpick because you may get an infection or bleed more easily. If you have any dental work done, tell your dentist you are receiving this medicine. Avoid taking products that contain aspirin, acetaminophen, ibuprofen, naproxen, or ketoprofen unless instructed by your doctor. These medicines may hide a fever. Do not become pregnant while taking this medicine. Women should inform their doctor if they wish to become pregnant or think they might be pregnant. There is a potential for serious side effects to an unborn child. Talk to your health care professional or pharmacist for more information. Do not breast-feed an infant while taking this medicine. Drink fluids as directed while you are taking this medicine. This will help protect your kidneys. Call your doctor or health care professional if you get diarrhea. Do not treat yourself. What side effects may I notice from receiving this medicine? Side effects that you should report to your doctor or health care professional as soon as possible: -allergic reactions like skin rash, itching or hives, swelling of the face, lips, or tongue -signs of infection - fever or chills, cough, sore throat, pain or difficulty passing urine -signs of decreased platelets or bleeding - bruising, pinpoint red spots on the skin, black, tarry stools, nosebleeds -signs of decreased red blood cells - unusually weak or tired, fainting spells, lightheadedness -breathing problems -changes in hearing -gout pain -low blood counts - This drug may decrease the number of white blood  cells, red blood cells and platelets. You may be at increased risk for infections and bleeding. -nausea and vomiting -pain, swelling, redness or irritation at the injection site -pain, tingling, numbness in the hands or feet -problems with balance, movement -trouble passing urine or change in the amount of urine Side effects that usually do not require medical attention (report to your doctor or health care professional if they continue or are bothersome): -changes in vision -loss of appetite -metallic taste in the mouth or changes in taste This list may not describe all possible side effects. Call your doctor for medical advice about side effects. You may report side effects to FDA at 1-800-FDA-1088. Where should I keep my medicine? This drug is given in a hospital or clinic and will not be stored at home. NOTE: This sheet is a summary. It may not cover all possible information. If you have questions about this medicine, talk to your doctor, pharmacist, or health care provider.  2015, [employer] Standard. (2007-08-17 14:40:54)    Etoposide, VP-16 injection What is this medicine? ETOPOSIDE, VP-16 (e toe POE side) is a chemotherapy drug. It is used to treat testicular cancer, lung cancer, and other cancers. This medicine may be used for other purposes; ask your health care provider or pharmacist if you have questions. COMMON BRAND NAME(S): Etopophos, Toposar, VePesid What should I tell my health care provider before I take this medicine? They need to know if you have any of these conditions: -infection -kidney disease -low blood counts, like low white cell, platelet, or red  cell counts -an unusual or allergic reaction to etoposide, other chemotherapeutic agents, other medicines, foods, dyes, or preservatives -pregnant or trying to get pregnant -breast-feeding How should I use this medicine? This medicine is for infusion into a vein. It is administered in a hospital or clinic by a  specially trained health care professional. Talk to your pediatrician regarding the use of this medicine in children. Special care may be needed. Overdosage: If you think you have taken too much of this medicine contact a poison control center or emergency room at once. NOTE: This medicine is only for you. Do not share this medicine with others. What if I miss a dose? It is important not to miss your dose. Call your doctor or health care professional if you are unable to keep an appointment. What may interact with this medicine? -cyclosporine -medicines to increase blood counts like filgrastim, pegfilgrastim, sargramostim -vaccines This list may not describe all possible interactions. Give your health care provider a list of all the medicines, herbs, non-prescription drugs, or dietary supplements you use. Also tell them if you smoke, drink alcohol, or use illegal drugs. Some items may interact with your medicine. What should I watch for while using this medicine? Visit your doctor for checks on your progress. This drug may make you feel generally unwell. This is not uncommon, as chemotherapy can affect healthy cells as well as cancer cells. Report any side effects. Continue your course of treatment even though you feel ill unless your doctor tells you to stop. In some cases, you may be given additional medicines to help with side effects. Follow all directions for their use. Call your doctor or health care professional for advice if you get a fever, chills or sore throat, or other symptoms of a cold or flu. Do not treat yourself. This drug decreases your body's ability to fight infections. Try to avoid being around people who are sick. This medicine may increase your risk to bruise or bleed. Call your doctor or health care professional if you notice any unusual bleeding. Be careful brushing and flossing your teeth or using a toothpick because you may get an infection or bleed more easily. If you have  any dental work done, tell your dentist you are receiving this medicine. Avoid taking products that contain aspirin, acetaminophen, ibuprofen, naproxen, or ketoprofen unless instructed by your doctor. These medicines may hide a fever. Do not become pregnant while taking this medicine. Women should inform their doctor if they wish to become pregnant or think they might be pregnant. There is a potential for serious side effects to an unborn child. Talk to your health care professional or pharmacist for more information. Do not breast-feed an infant while taking this medicine. What side effects may I notice from receiving this medicine? Side effects that you should report to your doctor or health care professional as soon as possible: -allergic reactions like skin rash, itching or hives, swelling of the face, lips, or tongue -low blood counts - this medicine may decrease the number of white blood cells, red blood cells and platelets. You may be at increased risk for infections and bleeding. -signs of infection - fever or chills, cough, sore throat, pain or difficulty passing urine -signs of decreased platelets or bleeding - bruising, pinpoint red spots on the skin, black, tarry stools, blood in the urine -signs of decreased red blood cells - unusually weak or tired, fainting spells, lightheadedness -breathing problems -changes in vision -mouth or throat sores or ulcers -  pain, redness, swelling or irritation at the injection site -pain, tingling, numbness in the hands or feet -redness, blistering, peeling or loosening of the skin, including inside the mouth -seizures -vomiting Side effects that usually do not require medical attention (report to your doctor or health care professional if they continue or are bothersome): -diarrhea -hair loss -loss of appetite -nausea -stomach pain This list may not describe all possible side effects. Call your doctor for medical advice about side effects. You may  report side effects to FDA at 1-800-FDA-1088. Where should I keep my medicine? This drug is given in a hospital or clinic and will not be stored at home. NOTE: This sheet is a summary. It may not cover all possible information. If you have questions about this medicine, talk to your doctor, pharmacist, or health care provider.  2015, [employer] Standard. (2007-09-13 17:24:12)

## 2014-02-08 ENCOUNTER — Ambulatory Visit
Admission: RE | Admit: 2014-02-08 | Discharge: 2014-02-08 | Disposition: A | Discharge status: Home / Self Care | Payer: Medicare Other | Source: Ambulatory Visit | Attending: Radiation Oncology | Admitting: Radiation Oncology

## 2014-02-08 ENCOUNTER — Ambulatory Visit (HOSPITAL_BASED_OUTPATIENT_CLINIC_OR_DEPARTMENT_OTHER): Payer: Medicare Other

## 2014-02-08 DIAGNOSIS — Z510 Encounter for antineoplastic radiation therapy: Secondary | ICD-10-CM | POA: Diagnosis not present

## 2014-02-08 DIAGNOSIS — C3430 Malignant neoplasm of lower lobe, unspecified bronchus or lung: Secondary | ICD-10-CM

## 2014-02-08 DIAGNOSIS — Z5111 Encounter for antineoplastic chemotherapy: Secondary | ICD-10-CM

## 2014-02-08 DIAGNOSIS — C3491 Malignant neoplasm of unspecified part of right bronchus or lung: Secondary | ICD-10-CM

## 2014-02-08 MED ORDER — SODIUM CHLORIDE 0.9 % IV SOLN
Freq: Once | INTRAVENOUS | Status: AC
  Administered 2014-02-08: 14:00:00 via INTRAVENOUS

## 2014-02-08 MED ORDER — DEXAMETHASONE SODIUM PHOSPHATE 10 MG/ML IJ SOLN
10.0000 mg | Freq: Once | INTRAMUSCULAR | Status: AC
  Administered 2014-02-08: 10 mg via INTRAVENOUS

## 2014-02-08 MED ORDER — DEXAMETHASONE SODIUM PHOSPHATE 10 MG/ML IJ SOLN
INTRAMUSCULAR | Status: AC
  Filled 2014-02-08: qty 1

## 2014-02-08 MED ORDER — SODIUM CHLORIDE 0.9 % IV SOLN
120.0000 mg/m2 | Freq: Once | INTRAVENOUS | Status: AC
  Administered 2014-02-08: 190 mg via INTRAVENOUS
  Filled 2014-02-08: qty 9.5

## 2014-02-08 NOTE — Patient Instructions (Signed)
[facility] Discharge Instructions for Patients Receiving Chemotherapy  Today you received the following chemotherapy agents Etoposide.   To help prevent nausea and vomiting after your treatment, we encourage you to take your nausea medication as directed.    If you develop nausea and vomiting that is not controlled by your nausea medication, call the clinic.   BELOW ARE SYMPTOMS THAT SHOULD BE REPORTED IMMEDIATELY:  *FEVER GREATER THAN 100.5 F  *CHILLS WITH OR WITHOUT FEVER  NAUSEA AND VOMITING THAT IS NOT CONTROLLED WITH YOUR NAUSEA MEDICATION  *UNUSUAL SHORTNESS OF BREATH  *UNUSUAL BRUISING OR BLEEDING  TENDERNESS IN MOUTH AND THROAT WITH OR WITHOUT PRESENCE OF ULCERS  *URINARY PROBLEMS  *BOWEL PROBLEMS  UNUSUAL RASH Items with * indicate a potential emergency and should be followed up as soon as possible.  Feel free to call the clinic you have any questions or concerns. The clinic phone number is [phone].

## 2014-02-09 ENCOUNTER — Ambulatory Visit
Admission: RE | Admit: 2014-02-09 | Discharge: 2014-02-09 | Disposition: A | Discharge status: Home / Self Care | Payer: Medicare Other | Source: Ambulatory Visit | Attending: Radiation Oncology | Admitting: Radiation Oncology

## 2014-02-09 ENCOUNTER — Ambulatory Visit (HOSPITAL_BASED_OUTPATIENT_CLINIC_OR_DEPARTMENT_OTHER): Payer: Medicare Other

## 2014-02-09 VITALS — BP 131/70 | HR 79 | Temp 97.7°F | Resp 17

## 2014-02-09 DIAGNOSIS — Z510 Encounter for antineoplastic radiation therapy: Secondary | ICD-10-CM | POA: Diagnosis not present

## 2014-02-09 DIAGNOSIS — C3430 Malignant neoplasm of lower lobe, unspecified bronchus or lung: Secondary | ICD-10-CM

## 2014-02-09 DIAGNOSIS — C3491 Malignant neoplasm of unspecified part of right bronchus or lung: Secondary | ICD-10-CM

## 2014-02-09 DIAGNOSIS — Z5111 Encounter for antineoplastic chemotherapy: Secondary | ICD-10-CM

## 2014-02-09 MED ORDER — SODIUM CHLORIDE 0.9 % IV SOLN
Freq: Once | INTRAVENOUS | Status: AC
  Administered 2014-02-09: 15:00:00 via INTRAVENOUS

## 2014-02-09 MED ORDER — DEXAMETHASONE SODIUM PHOSPHATE 10 MG/ML IJ SOLN
INTRAMUSCULAR | Status: AC
  Filled 2014-02-09: qty 1

## 2014-02-09 MED ORDER — SODIUM CHLORIDE 0.9 % IV SOLN
120.0000 mg/m2 | Freq: Once | INTRAVENOUS | Status: AC
  Administered 2014-02-09: 190 mg via INTRAVENOUS
  Filled 2014-02-09: qty 9.5

## 2014-02-09 MED ORDER — DEXAMETHASONE SODIUM PHOSPHATE 10 MG/ML IJ SOLN
10.0000 mg | Freq: Once | INTRAMUSCULAR | Status: AC
  Administered 2014-02-09: 10 mg via INTRAVENOUS

## 2014-02-09 NOTE — Patient Instructions (Signed)
[facility] Discharge Instructions for Patients Receiving Chemotherapy  Today you received the following chemotherapy agents Etoposide.  To help prevent nausea and vomiting after your treatment, we encourage you to take your nausea medication as prescribed.   If you develop nausea and vomiting that is not controlled by your nausea medication, call the clinic.   BELOW ARE SYMPTOMS THAT SHOULD BE REPORTED IMMEDIATELY:  *FEVER GREATER THAN 100.5 F  *CHILLS WITH OR WITHOUT FEVER  NAUSEA AND VOMITING THAT IS NOT CONTROLLED WITH YOUR NAUSEA MEDICATION  *UNUSUAL SHORTNESS OF BREATH  *UNUSUAL BRUISING OR BLEEDING  TENDERNESS IN MOUTH AND THROAT WITH OR WITHOUT PRESENCE OF ULCERS  *URINARY PROBLEMS  *BOWEL PROBLEMS  UNUSUAL RASH Items with * indicate a potential emergency and should be followed up as soon as possible.  Feel free to call the clinic you have any questions or concerns. The clinic phone number is [phone].

## 2014-02-10 ENCOUNTER — Ambulatory Visit (HOSPITAL_BASED_OUTPATIENT_CLINIC_OR_DEPARTMENT_OTHER): Payer: Medicare Other

## 2014-02-10 ENCOUNTER — Telehealth: Payer: Self-pay | Admitting: *Deleted

## 2014-02-10 ENCOUNTER — Ambulatory Visit
Admission: RE | Admit: 2014-02-10 | Discharge: 2014-02-10 | Disposition: A | Discharge status: Home / Self Care | Payer: Medicare Other | Source: Ambulatory Visit | Attending: Radiation Oncology | Admitting: Radiation Oncology

## 2014-02-10 VITALS — BP 132/72 | HR 72 | Temp 97.5°F

## 2014-02-10 DIAGNOSIS — C3491 Malignant neoplasm of unspecified part of right bronchus or lung: Secondary | ICD-10-CM

## 2014-02-10 DIAGNOSIS — Z5189 Encounter for other specified aftercare: Secondary | ICD-10-CM

## 2014-02-10 DIAGNOSIS — C3430 Malignant neoplasm of lower lobe, unspecified bronchus or lung: Secondary | ICD-10-CM

## 2014-02-10 DIAGNOSIS — Z510 Encounter for antineoplastic radiation therapy: Secondary | ICD-10-CM | POA: Diagnosis not present

## 2014-02-10 MED ORDER — PEGFILGRASTIM INJECTION 6 MG/0.6ML
6.0000 mg | Freq: Once | SUBCUTANEOUS | Status: AC
  Administered 2014-02-10: 6 mg via SUBCUTANEOUS
  Filled 2014-02-10: qty 0.6

## 2014-02-10 MED ORDER — BIAFINE EX EMUL
CUTANEOUS | Status: DC | PRN
Start: 2014-02-10 — End: 2014-02-11
  Administered 2014-02-10: 15:00:00 via TOPICAL

## 2014-02-10 NOTE — Telephone Encounter (Signed)
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  States that he has done very well.  No nausea, vomiting, or diarrhea.  He is drinking well and eating.  All questions answered.  Knows to call if he has any problems or concerns.

## 2014-02-10 NOTE — Patient Instructions (Signed)
Pegfilgrastim injection What is this medicine? PEGFILGRASTIM (peg fil GRA stim) is a long-acting granulocyte colony-stimulating factor that stimulates the growth of neutrophils, a type of white blood cell important in the body's fight against infection. It is used to reduce the incidence of fever and infection in patients with certain types of cancer who are receiving chemotherapy that affects the bone marrow. This medicine may be used for other purposes; ask your health care provider or pharmacist if you have questions. COMMON BRAND NAME(S): Neulasta What should I tell my health care provider before I take this medicine? They need to know if you have any of these conditions: -latex allergy -ongoing radiation therapy -sickle cell disease -skin reactions to acrylic adhesives (On-Body Injector only) -an unusual or allergic reaction to pegfilgrastim, filgrastim, other medicines, foods, dyes, or preservatives -pregnant or trying to get pregnant -breast-feeding How should I use this medicine? This medicine is for injection under the skin. If you get this medicine at home, you will be taught how to prepare and give the pre-filled syringe or how to use the On-body Injector. Refer to the patient Instructions for Use for detailed instructions. Use exactly as directed. Take your medicine at regular intervals. Do not take your medicine more often than directed. It is important that you put your used needles and syringes in a special sharps container. Do not put them in a trash can. If you do not have a sharps container, call your pharmacist or healthcare provider to get one. Talk to your pediatrician regarding the use of this medicine in children. Special care may be needed. Overdosage: If you think you have taken too much of this medicine contact a poison control center or emergency room at once. NOTE: This medicine is only for you. Do not share this medicine with others. What if I miss a dose? It is  important not to miss your dose. Call your doctor or health care professional if you miss your dose. If you miss a dose due to an On-body Injector failure or leakage, a new dose should be administered as soon as possible using a single prefilled syringe for manual use. What may interact with this medicine? Interactions have not been studied. Give your health care provider a list of all the medicines, herbs, non-prescription drugs, or dietary supplements you use. Also tell them if you smoke, drink alcohol, or use illegal drugs. Some items may interact with your medicine. This list may not describe all possible interactions. Give your health care provider a list of all the medicines, herbs, non-prescription drugs, or dietary supplements you use. Also tell them if you smoke, drink alcohol, or use illegal drugs. Some items may interact with your medicine. What should I watch for while using this medicine? You may need blood work done while you are taking this medicine. If you are going to need a MRI, CT scan, or other procedure, tell your doctor that you are using this medicine (On-Body Injector only). What side effects may I notice from receiving this medicine? Side effects that you should report to your doctor or health care professional as soon as possible: -allergic reactions like skin rash, itching or hives, swelling of the face, lips, or tongue -dizziness -fever -pain, redness, or irritation at site where injected -pinpoint red spots on the skin -shortness of breath or breathing problems -stomach or side pain, or pain at the shoulder -swelling -tiredness -trouble passing urine Side effects that usually do not require medical attention (report to your doctor   or health care professional if they continue or are bothersome): -bone pain -muscle pain This list may not describe all possible side effects. Call your doctor for medical advice about side effects. You may report side effects to FDA at  1-800-FDA-1088. Where should I keep my medicine? Keep out of the reach of children. Store pre-filled syringes in a refrigerator between 2 and 8 degrees C (36 and 46 degrees F). Do not freeze. Keep in carton to protect from light. Throw away this medicine if it is left out of the refrigerator for more than 48 hours. Throw away any unused medicine after the expiration date. NOTE: This sheet is a summary. It may not cover all possible information. If you have questions about this medicine, talk to your doctor, pharmacist, or health care provider.  2015, [employer] Standard. (2013-08-11 16:14:05)  

## 2014-02-10 NOTE — Addendum Note (Signed)
Encounter addended by: Arlyss Repress, RN on: 02/10/2014  3:00 PM<BR>     Documentation filed: Inpatient MAR

## 2014-02-13 ENCOUNTER — Other Ambulatory Visit (HOSPITAL_BASED_OUTPATIENT_CLINIC_OR_DEPARTMENT_OTHER): Payer: Medicare Other

## 2014-02-13 ENCOUNTER — Encounter: Payer: Self-pay | Admitting: Internal Medicine

## 2014-02-13 ENCOUNTER — Ambulatory Visit
Admission: RE | Admit: 2014-02-13 | Discharge: 2014-02-13 | Disposition: A | Discharge status: Home / Self Care | Payer: Medicare Other | Source: Ambulatory Visit | Attending: Radiation Oncology | Admitting: Radiation Oncology

## 2014-02-13 ENCOUNTER — Ambulatory Visit (HOSPITAL_BASED_OUTPATIENT_CLINIC_OR_DEPARTMENT_OTHER): Payer: Medicare Other | Admitting: Internal Medicine

## 2014-02-13 VITALS — BP 145/73 | HR 87 | Temp 97.7°F | Resp 17 | Ht 65.0 in | Wt 123.4 lb

## 2014-02-13 DIAGNOSIS — C3491 Malignant neoplasm of unspecified part of right bronchus or lung: Secondary | ICD-10-CM

## 2014-02-13 DIAGNOSIS — C3430 Malignant neoplasm of lower lobe, unspecified bronchus or lung: Secondary | ICD-10-CM

## 2014-02-13 DIAGNOSIS — Z510 Encounter for antineoplastic radiation therapy: Secondary | ICD-10-CM | POA: Diagnosis not present

## 2014-02-13 LAB — CBC WITH DIFFERENTIAL/PLATELET
BASO%: 0.2 % (ref 0.0–2.0)
Basophils Absolute: 0 10*3/uL (ref 0.0–0.1)
EOS%: 1.4 % (ref 0.0–7.0)
Eosinophils Absolute: 0.2 10*3/uL (ref 0.0–0.5)
HEMATOCRIT: 42.3 % (ref 38.4–49.9)
HGB: 14 g/dL (ref 13.0–17.1)
LYMPH%: 5.4 % — AB (ref 14.0–49.0)
MCH: 32.7 pg (ref 27.2–33.4)
MCHC: 33 g/dL (ref 32.0–36.0)
MCV: 99.1 fL — ABNORMAL HIGH (ref 79.3–98.0)
MONO#: 0.2 10*3/uL (ref 0.1–0.9)
MONO%: 1.3 % (ref 0.0–14.0)
NEUT#: 11.3 10*3/uL — ABNORMAL HIGH (ref 1.5–6.5)
NEUT%: 91.7 % — AB (ref 39.0–75.0)
PLATELETS: 108 10*3/uL — AB (ref 140–400)
RBC: 4.27 10*6/uL (ref 4.20–5.82)
RDW: 13.7 % (ref 11.0–14.6)
WBC: 12.3 10*3/uL — AB (ref 4.0–10.3)
lymph#: 0.7 10*3/uL — ABNORMAL LOW (ref 0.9–3.3)

## 2014-02-13 LAB — FUNGUS CULTURE W SMEAR: Fungal Smear: NONE SEEN

## 2014-02-13 LAB — COMPREHENSIVE METABOLIC PANEL (CC13)
ALT: 25 U/L (ref 0–55)
AST: 21 U/L (ref 5–34)
Albumin: 3.5 g/dL (ref 3.5–5.0)
Alkaline Phosphatase: 100 U/L (ref 40–150)
Anion Gap: 10 mEq/L (ref 3–11)
BILIRUBIN TOTAL: 1.33 mg/dL — AB (ref 0.20–1.20)
BUN: 22.2 mg/dL (ref 7.0–26.0)
CHLORIDE: 97 meq/L — AB (ref 98–109)
CO2: 31 mEq/L — ABNORMAL HIGH (ref 22–29)
CREATININE: 0.7 mg/dL (ref 0.7–1.3)
Calcium: 9.1 mg/dL (ref 8.4–10.4)
Glucose: 133 mg/dl (ref 70–140)
Potassium: 4.2 mEq/L (ref 3.5–5.1)
Sodium: 137 mEq/L (ref 136–145)
Total Protein: 6.5 g/dL (ref 6.4–8.3)

## 2014-02-13 LAB — MAGNESIUM (CC13): Magnesium: 2.3 mg/dl (ref 1.5–2.5)

## 2014-02-13 NOTE — Progress Notes (Signed)
Routine of clinic and patient education provided to patient, wife and daughter.Given Radiation Therapy and You Booklet, and biafine.Explained possible side effects to include skin changes, fatigue, shortness of breath and cough and how to take care of self by drinking fluids for hydration, resting, applying biafine to skin twice daily once skin changes occur (redness, itching and rash).take anti-emetics for nausea as needed as patient is also getting concurrent chemotherapy.Knows doctor day is every Tuesday after radiation treatment and as needed.Patient able to verbalize back to me at least two side effects and when he will see doctor and apply biafine using teach back method.

## 2014-02-13 NOTE — Progress Notes (Signed)
[facility] [phone]   [phone]  OFFICE PROGRESS NOTE  Patrick Pel, MD [address] [place] [address]  DIAGNOSIS: Limited stage small cell lung cancer, stage IIA (T1a, N1, M0) presented with right lower lobe nodule in addition to right hilar lymphadenopathy diagnosed in August of 2015.  PRIOR THERAPY: None.  CURRENT THERAPY: Systemic chemotherapy with cisplatin 60 mg/M2 on day 1 and etoposide at 120 mg/M2 on days 1, 2 and 3 with Neulasta support on day 4. Status post 1 cycle.  INTERVAL HISTORY: Patrick Carr 80 y.o. male returns to the clinic today for followup visit accompanied by his wife and 2 daughters. The patient related the first week of his treatment with cisplatin and etoposide fairly well with no significant adverse effects. He denied having any fever or chills. He has no nausea or vomiting. He denied having any significant chest pain, shortness breath, cough or hemoptysis. He has no significant weight loss or night sweats.  MEDICAL HISTORY: Past Medical History  Diagnosis Date  . Hypertension   . Hyperlipidemia   . BPH (benign prostatic hyperplasia)   . Lesion of right lung     RLL  . RBBB     no palpations  . Renal cysts, acquired, bilateral   . History of kidney stones   . Hernia, inguinal left   . History of blood transfusion     artery in sinus bleed    ALLERGIES:  is allergic to other.  MEDICATIONS:  Current Outpatient Prescriptions  Medication Sig Dispense Refill  . alfuzosin (UROXATRAL) 10 MG 24 hr tablet Take 10 mg by mouth daily.      [name] amLODipine (NORVASC) 5 MG tablet Take 5 mg by mouth daily.      [name] aspirin 81 MG tablet Take 81 mg by mouth daily.      [name] atorvastatin (LIPITOR) 40 MG tablet Take 40 mg by mouth daily.      [name] losartan (COZAAR) 50 MG tablet Take 50 mg by mouth daily.      . tamsulosin (FLOMAX) 0.4 MG CAPS capsule Take 0.4 mg by mouth daily.      [name] ALPRAZolam  (XANAX) 0.25 MG tablet Take 0.25 mg by mouth 3 (three) times daily as needed for anxiety.      . prochlorperazine (COMPAZINE) 10 MG tablet Take 1 tablet (10 mg total) by mouth every 6 (six) hours as needed for nausea or vomiting.  30 tablet  0   No current facility-administered medications for this visit.    SURGICAL HISTORY:  Past Surgical History  Procedure Laterality Date  . Sinus artery surgery      not surgery  . No past surgeries    . Video bronchoscopy with endobronchial navigation N/A 01/18/2014    Procedure: VIDEO BRONCHOSCOPY WITH ENDOBRONCHIAL NAVIGATION;  Surgeon: [name], MD;  Location: [facility] OR;  Service: Thoracic;  Laterality: N/A;  . Video bronchoscopy with endobronchial ultrasound N/A 01/18/2014    Procedure: VIDEO BRONCHOSCOPY WITH ENDOBRONCHIAL ULTRASOUND;  Surgeon: [name], MD;  Location: [facility];  Service: Thoracic;  Laterality: N/A;    REVIEW OF SYSTEMS:  A comprehensive review of systems was negative.   PHYSICAL EXAMINATION: General appearance: alert, cooperative and no distress Head: Normocephalic, without obvious abnormality, atraumatic Neck: no adenopathy, no JVD, supple, symmetrical, trachea midline and thyroid not enlarged, symmetric, no tenderness/mass/nodules Lymph nodes: Cervical, supraclavicular, and axillary nodes normal. Resp: clear to auscultation bilaterally  Back: symmetric, no curvature. ROM normal. No CVA tenderness. Cardio: regular rate and rhythm, S1, S2 normal, no murmur, click, rub or gallop GI: soft, non-tender; bowel sounds normal; no masses,  no organomegaly Extremities: extremities normal, atraumatic, no cyanosis or edema  ECOG PERFORMANCE STATUS: 1 - Symptomatic but completely ambulatory  Blood pressure 145/73, pulse 87, temperature 97.7 F (36.5 C), temperature source Oral, resp. rate 17, height 5\' 5"  (1.651 m), weight 123 lb 6.4 oz (55.974 kg), SpO2 95.00%.  LABORATORY DATA: Lab Results  Component Value Date   WBC  12.3* 02/13/2014   HGB 14.0 02/13/2014   HCT 42.3 02/13/2014   MCV 99.1* 02/13/2014   PLT 108* 02/13/2014      Chemistry      Component Value Date/Time   NA 140 02/07/2014 0805   NA 138 01/18/2014 0640   K 4.0 02/07/2014 0805   K 4.5 01/18/2014 0640   CL 99 01/18/2014 0640   CO2 28 02/07/2014 0805   CO2 26 01/18/2014 0640   BUN 19.3 02/07/2014 0805   BUN 19 01/18/2014 0640   CREATININE 0.8 02/07/2014 0805   CREATININE 0.63 01/18/2014 0640   CREATININE 0.80 01/12/2014 1519      Component Value Date/Time   CALCIUM 9.3 02/07/2014 0805   CALCIUM 9.0 01/18/2014 0640   ALKPHOS 81 02/07/2014 0805   ALKPHOS 88 01/18/2014 0640   AST 21 02/07/2014 0805   AST 31 01/18/2014 0640   ALT 17 02/07/2014 0805   ALT 18 01/18/2014 0640   BILITOT 0.69 02/07/2014 0805   BILITOT 0.4 01/18/2014 0640       RADIOGRAPHIC STUDIES: Dg Chest 2 View  01/18/2014   CLINICAL DATA:  Preop for bronchoscopy.  Lung nodule.  EXAM: CHEST  2 VIEW  COMPARISON:  CT chest 01/17/2014 and 01/03/2014.  Chest 12/06/2013  FINDINGS: 12 mm pulmonary nodule again demonstrated in the right mid lung. No change since previous study. Heart size and pulmonary vascularity are normal. Peribronchial thickening suggesting chronic bronchitis. Emphysematous changes in the lungs. No focal consolidation or airspace disease. No blunting of costophrenic angles. No pneumothorax. Degenerative changes in the spine.  IMPRESSION: Indeterminate nodule in the right mid lung. Emphysematous changes and chronic bronchitic changes. No focal consolidation.   Electronically Signed   By: Lucienne Capers M.D.   On: 01/18/2014 06:55   Dg Chest Port 1 View  01/18/2014   CLINICAL DATA:  Status post bronchoscopy.  EXAM: PORTABLE CHEST - 1 VIEW  COMPARISON:  01/18/2014 head CT chest 01/17/2014.  FINDINGS: Patient is rotated. Heart size normal. Thoracic aorta is calcified. Biapical pleural parenchymal scarring, right greater than left. No definite pneumothorax after bronchoscopy. Right  lower lobe nodule was better seen on 01/17/2014. No pleural fluid.  IMPRESSION: 1. No definite pneumothorax after bronchoscopy. 2. Right lower lobe nodule better visualized on 01/17/2014.   Electronically Signed   By: Lorin Picket M.D.   On: 01/18/2014 11:34   Ct Super D Chest Wo Contrast  01/17/2014   CLINICAL DATA:  Followup pulmonary nodule.  EXAM: CT CHEST WITHOUT CONTRAST (super D chest)  TECHNIQUE: Multidetector CT imaging of the chest was performed using thin slice collimation for electromagnetic bronchoscopy planning purposes, without intravenous contrast.  COMPARISON:  PET-CT 01/12/2014 and CT scan 01/03/2014.  FINDINGS: The chest wall is stable. No supraclavicular or axillary mass or adenopathy. No worrisome bone lesions. Moderate osteoporosis.  The heart is normal in size. No pericardial effusion. Stable tortuosity, ectasia and calcification of the thoracic  aorta. The esophagus is grossly normal.  Stable right hilar and infrahilar lymph nodes. These were metabolically active on the PET-CT.  The right lower lobe pulmonary nodule is stable. It measures approximately 12 by 7 mm.  No new lesions. Stable advanced emphysematous changes and dense apical scarring and calcification on the right.  The upper abdomen is unremarkable. Stable hyperdense/hemorrhagic renal cysts  IMPRESSION: Stable CT findings with a 12 mm superior segment right lower lobe pulmonary nodule and right hilar adenopathy.  Advanced emphysematous changes.   Electronically Signed   By: Kalman Jewels M.D.   On: 01/17/2014 16:01   Dg C-arm Bronchoscopy  01/18/2014   CLINICAL DATA: lung nodule   C-ARM BRONCHOSCOPY  Fluoroscopy was utilized by the requesting physician.  No radiographic  interpretation.     ASSESSMENT AND PLAN: This is a very pleasant 80 years old white male recently diagnosed with limited stage small cell lung cancer and currently undergoing systemic chemotherapy with cisplatin and etoposide status post 1 cycle. He  tolerated the first cycle of his treatment fairly well with no significant adverse effects. I recommended for the patient to continue with his weekly blood work and to call us immediately if he has any significant fever or chills, nausea or vomiting over the next 2 weeks. He would come back for followup visit in 2 weeks for evaluation before starting cycle #2. He was advised to call immediately if he has any concerning symptoms in the interval. The patient voices understanding of current disease status and treatment options and is in agreement with the current care plan.  All questions were answered. The patient knows to call the clinic with any problems, questions or concerns. We can certainly see the patient much sooner if necessary.  Disclaimer: This note was dictated with voice recognition software. Similar sounding words can inadvertently be transcribed and may not be corrected upon review.

## 2014-02-14 ENCOUNTER — Ambulatory Visit
Admission: RE | Admit: 2014-02-14 | Discharge: 2014-02-14 | Disposition: A | Discharge status: Home / Self Care | Payer: Medicare Other | Source: Ambulatory Visit | Attending: Radiation Oncology | Admitting: Radiation Oncology

## 2014-02-14 ENCOUNTER — Encounter: Payer: Self-pay | Admitting: Radiation Oncology

## 2014-02-14 VITALS — BP 138/65 | HR 82 | Temp 97.8°F | Resp 20 | Wt 121.5 lb

## 2014-02-14 DIAGNOSIS — C3491 Malignant neoplasm of unspecified part of right bronchus or lung: Secondary | ICD-10-CM

## 2014-02-14 DIAGNOSIS — Z510 Encounter for antineoplastic radiation therapy: Secondary | ICD-10-CM | POA: Diagnosis not present

## 2014-02-14 NOTE — Progress Notes (Signed)
Patient denies pain, cough, SOB, fatigue, loss of appetite.

## 2014-02-14 NOTE — Progress Notes (Signed)
Weekly Management Note Current Dose:10 Gy  Projected Dose:60 Gy   Narrative:  The patient presents for routine under treatment assessment.  CBCT/MVCT images/Port film x-rays were reviewed.  The chart was checked. Doing well. No complaints "I feel great"  Physical Findings:  Unchanged  Vitals:  Filed Vitals:   02/14/14 1429  BP: 138/65  Pulse: 82  Temp: 97.8 F (36.6 C)  Resp: 20   Weight:  Wt Readings from Last 3 Encounters:  02/14/14 121 lb 8 oz (55.112 kg)  02/13/14 123 lb 6.4 oz (55.974 kg)  01/26/14 118 lb 8 oz (53.751 kg)   Lab Results  Component Value Date   WBC 12.3* 02/13/2014   HGB 14.0 02/13/2014   HCT 42.3 02/13/2014   MCV 99.1* 02/13/2014   PLT 108* 02/13/2014   Lab Results  Component Value Date   CREATININE 0.7 02/13/2014   BUN 22.2 02/13/2014   NA 137 02/13/2014   K 4.2 02/13/2014   CL 99 01/18/2014   CO2 31* 02/13/2014     Impression:  The patient is tolerating radiation.  Plan:  Continue treatment as planned.

## 2014-02-15 ENCOUNTER — Telehealth: Payer: Self-pay | Admitting: *Deleted

## 2014-02-15 ENCOUNTER — Telehealth: Payer: Self-pay | Admitting: Internal Medicine

## 2014-02-15 ENCOUNTER — Ambulatory Visit
Admission: RE | Admit: 2014-02-15 | Discharge: 2014-02-15 | Disposition: A | Discharge status: Home / Self Care | Payer: Medicare Other | Source: Ambulatory Visit | Attending: Radiation Oncology | Admitting: Radiation Oncology

## 2014-02-15 DIAGNOSIS — Z510 Encounter for antineoplastic radiation therapy: Secondary | ICD-10-CM | POA: Diagnosis not present

## 2014-02-15 NOTE — Telephone Encounter (Signed)
sw. pt and he is going to pick up sched after radiation appt today

## 2014-02-15 NOTE — Telephone Encounter (Signed)
Per staff message and POF I have scheduled appts. Advised scheduler of appts. JMW  

## 2014-02-16 ENCOUNTER — Ambulatory Visit
Admission: RE | Admit: 2014-02-16 | Discharge: 2014-02-16 | Disposition: A | Discharge status: Home / Self Care | Payer: Medicare Other | Source: Ambulatory Visit | Attending: Radiation Oncology | Admitting: Radiation Oncology

## 2014-02-16 DIAGNOSIS — Z510 Encounter for antineoplastic radiation therapy: Secondary | ICD-10-CM | POA: Diagnosis not present

## 2014-02-17 ENCOUNTER — Ambulatory Visit
Admission: RE | Admit: 2014-02-17 | Discharge: 2014-02-17 | Disposition: A | Discharge status: Home / Self Care | Payer: Medicare Other | Source: Ambulatory Visit | Attending: Radiation Oncology | Admitting: Radiation Oncology

## 2014-02-17 DIAGNOSIS — Z510 Encounter for antineoplastic radiation therapy: Secondary | ICD-10-CM | POA: Diagnosis not present

## 2014-02-20 ENCOUNTER — Other Ambulatory Visit (HOSPITAL_BASED_OUTPATIENT_CLINIC_OR_DEPARTMENT_OTHER): Payer: Medicare Other

## 2014-02-20 ENCOUNTER — Ambulatory Visit
Admission: RE | Admit: 2014-02-20 | Discharge: 2014-02-20 | Disposition: A | Discharge status: Home / Self Care | Payer: Medicare Other | Source: Ambulatory Visit | Attending: Radiation Oncology | Admitting: Radiation Oncology

## 2014-02-20 DIAGNOSIS — C3491 Malignant neoplasm of unspecified part of right bronchus or lung: Secondary | ICD-10-CM

## 2014-02-20 DIAGNOSIS — C3430 Malignant neoplasm of lower lobe, unspecified bronchus or lung: Secondary | ICD-10-CM

## 2014-02-20 DIAGNOSIS — Z510 Encounter for antineoplastic radiation therapy: Secondary | ICD-10-CM | POA: Diagnosis not present

## 2014-02-20 LAB — CBC WITH DIFFERENTIAL/PLATELET
BASO%: 0.3 % (ref 0.0–2.0)
BASOS ABS: 0 10*3/uL (ref 0.0–0.1)
EOS ABS: 0.1 10*3/uL (ref 0.0–0.5)
EOS%: 0.7 % (ref 0.0–7.0)
HCT: 42.5 % (ref 38.4–49.9)
HEMOGLOBIN: 13.9 g/dL (ref 13.0–17.1)
LYMPH%: 8.2 % — AB (ref 14.0–49.0)
MCH: 32.7 pg (ref 27.2–33.4)
MCHC: 32.8 g/dL (ref 32.0–36.0)
MCV: 99.7 fL — AB (ref 79.3–98.0)
MONO#: 0.9 10*3/uL (ref 0.1–0.9)
MONO%: 8.4 % (ref 0.0–14.0)
NEUT%: 82.4 % — ABNORMAL HIGH (ref 39.0–75.0)
NEUTROS ABS: 8.6 10*3/uL — AB (ref 1.5–6.5)
PLATELETS: 110 10*3/uL — AB (ref 140–400)
RBC: 4.27 10*6/uL (ref 4.20–5.82)
RDW: 13.9 % (ref 11.0–14.6)
WBC: 10.4 10*3/uL — ABNORMAL HIGH (ref 4.0–10.3)
lymph#: 0.9 10*3/uL (ref 0.9–3.3)

## 2014-02-20 LAB — COMPREHENSIVE METABOLIC PANEL (CC13)
ALT: 18 U/L (ref 0–55)
AST: 22 U/L (ref 5–34)
Albumin: 3.6 g/dL (ref 3.5–5.0)
Alkaline Phosphatase: 101 U/L (ref 40–150)
Anion Gap: 9 meq/L (ref 3–11)
BUN: 13.8 mg/dL (ref 7.0–26.0)
CO2: 30 meq/L — ABNORMAL HIGH (ref 22–29)
Calcium: 9.5 mg/dL (ref 8.4–10.4)
Chloride: 100 meq/L (ref 98–109)
Creatinine: 0.9 mg/dL (ref 0.7–1.3)
Glucose: 169 mg/dL — ABNORMAL HIGH (ref 70–140)
Potassium: 4.4 meq/L (ref 3.5–5.1)
Sodium: 139 meq/L (ref 136–145)
Total Bilirubin: 0.37 mg/dL (ref 0.20–1.20)
Total Protein: 6.8 g/dL (ref 6.4–8.3)

## 2014-02-20 LAB — MAGNESIUM (CC13): MAGNESIUM: 2.2 mg/dL (ref 1.5–2.5)

## 2014-02-21 ENCOUNTER — Encounter: Payer: Self-pay | Admitting: Radiation Oncology

## 2014-02-21 ENCOUNTER — Ambulatory Visit
Admission: RE | Admit: 2014-02-21 | Discharge: 2014-02-21 | Disposition: A | Discharge status: Home / Self Care | Payer: Medicare Other | Source: Ambulatory Visit | Attending: Radiation Oncology | Admitting: Radiation Oncology

## 2014-02-21 VITALS — BP 116/68 | HR 90 | Temp 98.0°F | Resp 20 | Wt 118.9 lb

## 2014-02-21 DIAGNOSIS — C3491 Malignant neoplasm of unspecified part of right bronchus or lung: Secondary | ICD-10-CM

## 2014-02-21 DIAGNOSIS — Z510 Encounter for antineoplastic radiation therapy: Secondary | ICD-10-CM | POA: Diagnosis not present

## 2014-02-21 NOTE — Progress Notes (Signed)
Weekly rd txs, rt lung, 10/30, no difficulty swallowing foods/liquids, no throat soreness, no coughing,nausea, appetite good stated but has lost 3 lbs, no fatigue 2:31 PM

## 2014-02-21 NOTE — Progress Notes (Signed)
Weekly Management Note Current Dose:20 Gy  Projected Dose:60 Gy   Narrative:  The patient presents for routine under treatment assessment.  CBCT/MVCT images/Port film x-rays were reviewed.  The chart was checked. Doing well. Decreased appetite. No pain with swallowing. Tolerating chemo well.   Physical Findings:  Unchanged  Vitals:  Filed Vitals:   02/21/14 1428  BP: 116/68  Pulse: 90  Temp: 98 F (36.7 C)  Resp: 20   Weight:  Wt Readings from Last 3 Encounters:  02/21/14 118 lb 14.4 oz (53.933 kg)  02/14/14 121 lb 8 oz (55.112 kg)  02/13/14 123 lb 6.4 oz (55.974 kg)   Lab Results  Component Value Date   WBC 10.4* 02/20/2014   HGB 13.9 02/20/2014   HCT 42.5 02/20/2014   MCV 99.7* 02/20/2014   PLT 110* 02/20/2014   Lab Results  Component Value Date   CREATININE 0.9 02/20/2014   BUN 13.8 02/20/2014   NA 139 02/20/2014   K 4.4 02/20/2014   CL 99 01/18/2014   CO2 30* 02/20/2014     Impression:  The patient is tolerating radiation.  Plan:  Continue treatment as planned. Will ask dietician to see. Discussed supplements (he is taking 2-3 boost per day) and weight loss. Encouraged pos.

## 2014-02-22 ENCOUNTER — Ambulatory Visit
Admission: RE | Admit: 2014-02-22 | Discharge: 2014-02-22 | Disposition: A | Discharge status: Home / Self Care | Payer: Medicare Other | Source: Ambulatory Visit | Attending: Radiation Oncology | Admitting: Radiation Oncology

## 2014-02-22 DIAGNOSIS — Z510 Encounter for antineoplastic radiation therapy: Secondary | ICD-10-CM | POA: Diagnosis not present

## 2014-02-23 ENCOUNTER — Ambulatory Visit
Admission: RE | Admit: 2014-02-23 | Discharge: 2014-02-23 | Disposition: A | Discharge status: Home / Self Care | Payer: Medicare Other | Source: Ambulatory Visit | Attending: Radiation Oncology | Admitting: Radiation Oncology

## 2014-02-23 DIAGNOSIS — Z510 Encounter for antineoplastic radiation therapy: Secondary | ICD-10-CM | POA: Diagnosis present

## 2014-02-23 DIAGNOSIS — C3491 Malignant neoplasm of unspecified part of right bronchus or lung: Secondary | ICD-10-CM | POA: Diagnosis not present

## 2014-02-24 ENCOUNTER — Ambulatory Visit
Admission: RE | Admit: 2014-02-24 | Discharge: 2014-02-24 | Disposition: A | Discharge status: Home / Self Care | Payer: Medicare Other | Source: Ambulatory Visit | Attending: Radiation Oncology | Admitting: Radiation Oncology

## 2014-02-24 DIAGNOSIS — Z510 Encounter for antineoplastic radiation therapy: Secondary | ICD-10-CM | POA: Diagnosis not present

## 2014-02-27 ENCOUNTER — Ambulatory Visit (HOSPITAL_BASED_OUTPATIENT_CLINIC_OR_DEPARTMENT_OTHER): Payer: Medicare Other | Admitting: Physician Assistant

## 2014-02-27 ENCOUNTER — Other Ambulatory Visit (HOSPITAL_BASED_OUTPATIENT_CLINIC_OR_DEPARTMENT_OTHER): Payer: Medicare Other

## 2014-02-27 ENCOUNTER — Telehealth: Payer: Self-pay | Admitting: Internal Medicine

## 2014-02-27 ENCOUNTER — Other Ambulatory Visit: Payer: Medicare Other

## 2014-02-27 ENCOUNTER — Ambulatory Visit
Admission: RE | Admit: 2014-02-27 | Discharge: 2014-02-27 | Disposition: A | Discharge status: Home / Self Care | Payer: Medicare Other | Source: Ambulatory Visit | Attending: Radiation Oncology | Admitting: Radiation Oncology

## 2014-02-27 ENCOUNTER — Ambulatory Visit: Payer: Medicare Other

## 2014-02-27 VITALS — BP 125/63 | HR 107 | Temp 97.7°F | Resp 18 | Ht 65.0 in | Wt 121.8 lb

## 2014-02-27 DIAGNOSIS — C3491 Malignant neoplasm of unspecified part of right bronchus or lung: Secondary | ICD-10-CM

## 2014-02-27 DIAGNOSIS — C3430 Malignant neoplasm of lower lobe, unspecified bronchus or lung: Secondary | ICD-10-CM

## 2014-02-27 DIAGNOSIS — Z510 Encounter for antineoplastic radiation therapy: Secondary | ICD-10-CM | POA: Diagnosis not present

## 2014-02-27 LAB — CBC WITH DIFFERENTIAL/PLATELET
BASO%: 0.1 % (ref 0.0–2.0)
Basophils Absolute: 0 10*3/uL (ref 0.0–0.1)
EOS%: 0.4 % (ref 0.0–7.0)
Eosinophils Absolute: 0.1 10*3/uL (ref 0.0–0.5)
HEMATOCRIT: 40.1 % (ref 38.4–49.9)
HEMOGLOBIN: 13.2 g/dL (ref 13.0–17.1)
LYMPH%: 5.6 % — ABNORMAL LOW (ref 14.0–49.0)
MCH: 32.3 pg (ref 27.2–33.4)
MCHC: 32.8 g/dL (ref 32.0–36.0)
MCV: 98.3 fL — ABNORMAL HIGH (ref 79.3–98.0)
MONO#: 1.1 10*3/uL — AB (ref 0.1–0.9)
MONO%: 7.3 % (ref 0.0–14.0)
NEUT#: 12.8 10*3/uL — ABNORMAL HIGH (ref 1.5–6.5)
NEUT%: 86.6 % — AB (ref 39.0–75.0)
Platelets: 218 10*3/uL (ref 140–400)
RBC: 4.08 10*6/uL — ABNORMAL LOW (ref 4.20–5.82)
RDW: 13.8 % (ref 11.0–14.6)
WBC: 14.8 10*3/uL — ABNORMAL HIGH (ref 4.0–10.3)
lymph#: 0.8 10*3/uL — ABNORMAL LOW (ref 0.9–3.3)

## 2014-02-27 LAB — COMPREHENSIVE METABOLIC PANEL (CC13)
ALT: 23 U/L (ref 0–55)
ANION GAP: 9 meq/L (ref 3–11)
AST: 20 U/L (ref 5–34)
Albumin: 3.4 g/dL — ABNORMAL LOW (ref 3.5–5.0)
Alkaline Phosphatase: 114 U/L (ref 40–150)
BILIRUBIN TOTAL: 0.24 mg/dL (ref 0.20–1.20)
BUN: 22.9 mg/dL (ref 7.0–26.0)
CO2: 31 meq/L — AB (ref 22–29)
Calcium: 9.5 mg/dL (ref 8.4–10.4)
Chloride: 101 mEq/L (ref 98–109)
Creatinine: 0.8 mg/dL (ref 0.7–1.3)
GLUCOSE: 114 mg/dL (ref 70–140)
Potassium: 4.6 mEq/L (ref 3.5–5.1)
Sodium: 142 mEq/L (ref 136–145)
Total Protein: 6.7 g/dL (ref 6.4–8.3)

## 2014-02-27 LAB — MAGNESIUM (CC13): MAGNESIUM: 2.4 mg/dL (ref 1.5–2.5)

## 2014-02-27 NOTE — Telephone Encounter (Signed)
gv and printed appt sched and avs fo rpt fo rOCT...sed added tx.

## 2014-02-28 ENCOUNTER — Ambulatory Visit: Payer: Medicare Other

## 2014-02-28 ENCOUNTER — Ambulatory Visit
Admission: RE | Admit: 2014-02-28 | Discharge: 2014-02-28 | Disposition: A | Discharge status: Home / Self Care | Payer: Medicare Other | Source: Ambulatory Visit | Attending: Radiation Oncology | Admitting: Radiation Oncology

## 2014-02-28 ENCOUNTER — Inpatient Hospital Stay
Admission: RE | Admit: 2014-02-28 | Discharge: 2014-02-28 | Disposition: A | Discharge status: Home / Self Care | Payer: Medicare Other | Source: Ambulatory Visit | Attending: Radiation Oncology | Admitting: Radiation Oncology

## 2014-02-28 ENCOUNTER — Ambulatory Visit (HOSPITAL_BASED_OUTPATIENT_CLINIC_OR_DEPARTMENT_OTHER): Payer: Medicare Other

## 2014-02-28 ENCOUNTER — Encounter: Payer: Self-pay | Admitting: *Deleted

## 2014-02-28 VITALS — BP 148/74 | HR 100 | Temp 97.2°F | Resp 18

## 2014-02-28 DIAGNOSIS — Z510 Encounter for antineoplastic radiation therapy: Secondary | ICD-10-CM | POA: Diagnosis not present

## 2014-02-28 DIAGNOSIS — Z5111 Encounter for antineoplastic chemotherapy: Secondary | ICD-10-CM

## 2014-02-28 DIAGNOSIS — C3491 Malignant neoplasm of unspecified part of right bronchus or lung: Secondary | ICD-10-CM

## 2014-02-28 DIAGNOSIS — C3430 Malignant neoplasm of lower lobe, unspecified bronchus or lung: Secondary | ICD-10-CM

## 2014-02-28 MED ORDER — SODIUM CHLORIDE 0.9 % IV SOLN
Freq: Once | INTRAVENOUS | Status: AC
  Administered 2014-02-28: 10:00:00 via INTRAVENOUS

## 2014-02-28 MED ORDER — SODIUM CHLORIDE 0.9 % IV SOLN
60.0000 mg/m2 | Freq: Once | INTRAVENOUS | Status: AC
  Administered 2014-02-28: 94 mg via INTRAVENOUS
  Filled 2014-02-28: qty 94

## 2014-02-28 MED ORDER — DEXAMETHASONE SODIUM PHOSPHATE 20 MG/5ML IJ SOLN
12.0000 mg | Freq: Once | INTRAMUSCULAR | Status: AC
  Administered 2014-02-28: 12 mg via INTRAVENOUS

## 2014-02-28 MED ORDER — PALONOSETRON HCL INJECTION 0.25 MG/5ML
0.2500 mg | Freq: Once | INTRAVENOUS | Status: AC
  Administered 2014-02-28: 0.25 mg via INTRAVENOUS

## 2014-02-28 MED ORDER — PALONOSETRON HCL INJECTION 0.25 MG/5ML
INTRAVENOUS | Status: AC
  Filled 2014-02-28: qty 5

## 2014-02-28 MED ORDER — ETOPOSIDE CHEMO INJECTION 1 GM/50ML
120.0000 mg/m2 | Freq: Once | INTRAVENOUS | Status: AC
  Administered 2014-02-28: 190 mg via INTRAVENOUS
  Filled 2014-02-28: qty 9.5

## 2014-02-28 MED ORDER — POTASSIUM CHLORIDE 2 MEQ/ML IV SOLN
Freq: Once | INTRAVENOUS | Status: AC
  Administered 2014-02-28: 10:00:00 via INTRAVENOUS
  Filled 2014-02-28: qty 10

## 2014-02-28 MED ORDER — SODIUM CHLORIDE 0.9 % IV SOLN
150.0000 mg | Freq: Once | INTRAVENOUS | Status: AC
  Administered 2014-02-28: 150 mg via INTRAVENOUS
  Filled 2014-02-28: qty 5

## 2014-02-28 MED ORDER — DEXAMETHASONE SODIUM PHOSPHATE 20 MG/5ML IJ SOLN
INTRAMUSCULAR | Status: AC
  Filled 2014-02-28: qty 5

## 2014-02-28 NOTE — [facility] Oncology Navigator Note (Unsigned)
Spoke with Mr. Patrick Carr and son in chemo room today.  He stated he was doing well with treatment.  He is having some hair loss and plans on shaving his head.  He is in good spirits today.  No needs identified.

## 2014-02-28 NOTE — Patient Instructions (Signed)
[facility] Discharge Instructions for Patients Receiving Chemotherapy  Today you received the following chemotherapy agents Cisplatin and Etoposide.  To help prevent nausea and vomiting after your treatment, we encourage you to take your nausea medication.   If you develop nausea and vomiting that is not controlled by your nausea medication, call the clinic.   BELOW ARE SYMPTOMS THAT SHOULD BE REPORTED IMMEDIATELY:  *FEVER GREATER THAN 100.5 F  *CHILLS WITH OR WITHOUT FEVER  NAUSEA AND VOMITING THAT IS NOT CONTROLLED WITH YOUR NAUSEA MEDICATION  *UNUSUAL SHORTNESS OF BREATH  *UNUSUAL BRUISING OR BLEEDING  TENDERNESS IN MOUTH AND THROAT WITH OR WITHOUT PRESENCE OF ULCERS  *URINARY PROBLEMS  *BOWEL PROBLEMS  UNUSUAL RASH Items with * indicate a potential emergency and should be followed up as soon as possible.  Feel free to call the clinic you have any questions or concerns. The clinic phone number is [phone].

## 2014-02-28 NOTE — Progress Notes (Signed)
Weekly Management Note Current Dose:15 Gy  Projected Dose:30 Gy   Narrative:  The patient presents for routine under treatment assessment.  CBCT/MVCT images/Port film x-rays were reviewed.  The chart was checked.Doing well. Eating better he says. No dysphagia.   Physical Findings:  Unchanged  Vitals: There were no vitals filed for this visit. Weight:  Wt Readings from Last 3 Encounters:  02/27/14 121 lb 12.8 oz (55.248 kg)  02/21/14 118 lb 14.4 oz (53.933 kg)  02/14/14 121 lb 8 oz (55.112 kg)   Lab Results  Component Value Date   WBC 14.8* 02/27/2014   HGB 13.2 02/27/2014   HCT 40.1 02/27/2014   MCV 98.3* 02/27/2014   PLT 218 02/27/2014   Lab Results  Component Value Date   CREATININE 0.8 02/27/2014   BUN 22.9 02/27/2014   NA 142 02/27/2014   K 4.6 02/27/2014   CL 99 01/18/2014   CO2 31* 02/27/2014     Impression:  The patient is tolerating radiation.  Plan:  Continue treatment as planned. Chemo today. Encouraged POs.

## 2014-03-01 ENCOUNTER — Ambulatory Visit (HOSPITAL_BASED_OUTPATIENT_CLINIC_OR_DEPARTMENT_OTHER): Payer: Medicare Other

## 2014-03-01 ENCOUNTER — Ambulatory Visit
Admission: RE | Admit: 2014-03-01 | Discharge: 2014-03-01 | Disposition: A | Discharge status: Home / Self Care | Payer: Medicare Other | Source: Ambulatory Visit | Attending: Radiation Oncology | Admitting: Radiation Oncology

## 2014-03-01 VITALS — BP 110/59 | HR 98 | Temp 97.8°F | Resp 16

## 2014-03-01 DIAGNOSIS — Z5111 Encounter for antineoplastic chemotherapy: Secondary | ICD-10-CM

## 2014-03-01 DIAGNOSIS — C3491 Malignant neoplasm of unspecified part of right bronchus or lung: Secondary | ICD-10-CM

## 2014-03-01 DIAGNOSIS — Z510 Encounter for antineoplastic radiation therapy: Secondary | ICD-10-CM | POA: Diagnosis not present

## 2014-03-01 DIAGNOSIS — C3430 Malignant neoplasm of lower lobe, unspecified bronchus or lung: Secondary | ICD-10-CM

## 2014-03-01 MED ORDER — SODIUM CHLORIDE 0.9 % IV SOLN
120.0000 mg/m2 | Freq: Once | INTRAVENOUS | Status: AC
  Administered 2014-03-01: 190 mg via INTRAVENOUS
  Filled 2014-03-01: qty 9.5

## 2014-03-01 MED ORDER — SODIUM CHLORIDE 0.9 % IV SOLN
Freq: Once | INTRAVENOUS | Status: AC
  Administered 2014-03-01: 14:00:00 via INTRAVENOUS

## 2014-03-01 MED ORDER — DEXAMETHASONE SODIUM PHOSPHATE 10 MG/ML IJ SOLN
10.0000 mg | Freq: Once | INTRAMUSCULAR | Status: AC
  Administered 2014-03-01: 10 mg via INTRAVENOUS

## 2014-03-01 MED ORDER — DEXAMETHASONE SODIUM PHOSPHATE 10 MG/ML IJ SOLN
INTRAMUSCULAR | Status: AC
Start: 2014-03-01 — End: 2014-03-01
  Filled 2014-03-01: qty 1

## 2014-03-01 NOTE — Patient Instructions (Signed)
[facility] Discharge Instructions for Patients Receiving Chemotherapy  Today you received the following chemotherapy agent: Etoposide   To help prevent nausea and vomiting after your treatment, we encourage you to take your nausea medication as directed:   Compazine 10 mg every 6 hours as needed   If you develop nausea and vomiting that is not controlled by your nausea medication, call the clinic.   BELOW ARE SYMPTOMS THAT SHOULD BE REPORTED IMMEDIATELY:  *FEVER GREATER THAN 100.5 F  *CHILLS WITH OR WITHOUT FEVER  NAUSEA AND VOMITING THAT IS NOT CONTROLLED WITH YOUR NAUSEA MEDICATION  *UNUSUAL SHORTNESS OF BREATH  *UNUSUAL BRUISING OR BLEEDING  TENDERNESS IN MOUTH AND THROAT WITH OR WITHOUT PRESENCE OF ULCERS  *URINARY PROBLEMS  *BOWEL PROBLEMS  UNUSUAL RASH Items with * indicate a potential emergency and should be followed up as soon as possible.  Feel free to call the clinic should you have any questions or concerns. The clinic phone number is [phone].  It has been a pleasure to serve you today!

## 2014-03-02 ENCOUNTER — Ambulatory Visit (HOSPITAL_BASED_OUTPATIENT_CLINIC_OR_DEPARTMENT_OTHER): Payer: Medicare Other

## 2014-03-02 ENCOUNTER — Ambulatory Visit: Payer: Medicare Other

## 2014-03-02 ENCOUNTER — Ambulatory Visit
Admission: RE | Admit: 2014-03-02 | Discharge: 2014-03-02 | Disposition: A | Discharge status: Home / Self Care | Payer: Medicare Other | Source: Ambulatory Visit | Attending: Radiation Oncology | Admitting: Radiation Oncology

## 2014-03-02 VITALS — BP 139/73 | HR 94 | Temp 98.0°F

## 2014-03-02 DIAGNOSIS — Z5111 Encounter for antineoplastic chemotherapy: Secondary | ICD-10-CM

## 2014-03-02 DIAGNOSIS — C3491 Malignant neoplasm of unspecified part of right bronchus or lung: Secondary | ICD-10-CM

## 2014-03-02 DIAGNOSIS — C7A1 Malignant poorly differentiated neuroendocrine tumors: Secondary | ICD-10-CM

## 2014-03-02 DIAGNOSIS — Z510 Encounter for antineoplastic radiation therapy: Secondary | ICD-10-CM | POA: Diagnosis not present

## 2014-03-02 LAB — AFB CULTURE WITH SMEAR (NOT AT ARMC): Acid Fast Smear: NONE SEEN

## 2014-03-02 MED ORDER — SODIUM CHLORIDE 0.9 % IV SOLN
120.0000 mg/m2 | Freq: Once | INTRAVENOUS | Status: AC
  Administered 2014-03-02: 190 mg via INTRAVENOUS
  Filled 2014-03-02: qty 9.5

## 2014-03-02 MED ORDER — SODIUM CHLORIDE 0.9 % IV SOLN
Freq: Once | INTRAVENOUS | Status: AC
  Administered 2014-03-02: 12:00:00 via INTRAVENOUS

## 2014-03-02 MED ORDER — DEXAMETHASONE SODIUM PHOSPHATE 10 MG/ML IJ SOLN
10.0000 mg | Freq: Once | INTRAMUSCULAR | Status: AC
  Administered 2014-03-02: 10 mg via INTRAVENOUS

## 2014-03-02 NOTE — Patient Instructions (Signed)
Patrick Carr Discharge Instructions for Patients Receiving Chemotherapy  Today you received the following chemotherapy agents etoposide.   To help prevent nausea and vomiting after your treatment, we encourage you to take your nausea medication as directed   If you develop nausea and vomiting that is not controlled by your nausea medication, call the clinic.   BELOW ARE SYMPTOMS THAT SHOULD BE REPORTED IMMEDIATELY:  *FEVER GREATER THAN 100.5 F  *CHILLS WITH OR WITHOUT FEVER  NAUSEA AND VOMITING THAT IS NOT CONTROLLED WITH YOUR NAUSEA MEDICATION  *UNUSUAL SHORTNESS OF BREATH  *UNUSUAL BRUISING OR BLEEDING  TENDERNESS IN MOUTH AND THROAT WITH OR WITHOUT PRESENCE OF ULCERS  *URINARY PROBLEMS  *BOWEL PROBLEMS  UNUSUAL RASH Items with * indicate a potential emergency and should be followed up as soon as possible.  Feel free to call the clinic you have any questions or concerns. The clinic phone number is [phone].

## 2014-03-02 NOTE — Progress Notes (Addendum)
[facility] [facility] [phone]   [phone]  OFFICE PROGRESS NOTE  [name], MD [address] [place] [id number]  DIAGNOSIS: Limited stage small cell lung cancer, stage IIA (T1a, N1, M0) presented with right lower lobe nodule in addition to right hilar lymphadenopathy diagnosed in August of 2015.  PRIOR THERAPY: None.  CURRENT THERAPY: Systemic chemotherapy with cisplatin 60 mg/M2 on day 1 and etoposide at 120 mg/M2 on days 1, 2 and 3 with Neulasta support on day 4. Status post 1 cycle.  INTERVAL HISTORY: Patrick Carr 80 y.o. male returns to the clinic today for followup visit accompanied by his wife and 2 daughters. The patient tolerated his first cycle of his treatment with cisplatin and etoposide fairly well with no significant adverse effects. He did have some pain in his hips for about a day, after receiving the Neulasta injection. He denied having any fever or chills. He has no nausea or vomiting. He denied having any significant chest pain, shortness breath, cough or hemoptysis. He has no significant weight loss or night sweats. He presents to proceed with cycle #2  MEDICAL HISTORY: Past Medical History  Diagnosis Date  . Hypertension   . Hyperlipidemia   . BPH (benign prostatic hyperplasia)   . Lesion of right lung     RLL  . RBBB     no palpations  . Renal cysts, acquired, bilateral   . History of kidney stones   . Hernia, inguinal left   . History of blood transfusion     artery in sinus bleed    ALLERGIES:  is allergic to other.  MEDICATIONS:  Current Outpatient Prescriptions  Medication Sig Dispense Refill  . alfuzosin (UROXATRAL) 10 MG 24 hr tablet Take 10 mg by mouth daily.      [name] amLODipine (NORVASC) 5 MG tablet Take 5 mg by mouth daily.      [name] aspirin 81 MG tablet Take 81 mg by mouth daily.      [name] atorvastatin (LIPITOR) 40 MG tablet Take 40 mg by mouth daily.      [name] losartan (COZAAR) 50 MG  tablet Take 50 mg by mouth daily.      . tamsulosin (FLOMAX) 0.4 MG CAPS capsule Take 0.4 mg by mouth daily.      [name] ALPRAZolam (XANAX) 0.25 MG tablet Take 0.25 mg by mouth 3 (three) times daily as needed for anxiety.      . prochlorperazine (COMPAZINE) 10 MG tablet Take 1 tablet (10 mg total) by mouth every 6 (six) hours as needed for nausea or vomiting.  30 tablet  0   No current facility-administered medications for this visit.    SURGICAL HISTORY:  Past Surgical History  Procedure Laterality Date  . Sinus artery surgery      not surgery  . No past surgeries    . Video bronchoscopy with endobronchial navigation N/A 01/18/2014    Procedure: VIDEO BRONCHOSCOPY WITH ENDOBRONCHIAL NAVIGATION;  Surgeon: [name], MD;  Location: [facility] OR;  Service: Thoracic;  Laterality: N/A;  . Video bronchoscopy with endobronchial ultrasound N/A 01/18/2014    Procedure: VIDEO BRONCHOSCOPY WITH ENDOBRONCHIAL ULTRASOUND;  Surgeon: [name], MD;  Location: [facility];  Service: Thoracic;  Laterality: N/A;    REVIEW OF SYSTEMS:  A comprehensive review of systems was negative.   PHYSICAL EXAMINATION: General appearance: alert, cooperative and no distress Head: Normocephalic, without obvious abnormality, atraumatic Neck: no adenopathy, no JVD,  supple, symmetrical, trachea midline and thyroid not enlarged, symmetric, no tenderness/mass/nodules Lymph nodes: Cervical, supraclavicular, and axillary nodes normal. Resp: clear to auscultation bilaterally Back: symmetric, no curvature. ROM normal. No CVA tenderness. Cardio: regular rate and rhythm, S1, S2 normal, no murmur, click, rub or gallop GI: soft, non-tender; bowel sounds normal; no masses,  no organomegaly Extremities: extremities normal, atraumatic, no cyanosis or edema  ECOG PERFORMANCE STATUS: 1 - Symptomatic but completely ambulatory  Blood pressure 125/63, pulse 107, temperature 97.7 F (36.5 C), temperature source Oral, resp. rate 18, height  5\' 5"  (1.651 m), weight 121 lb 12.8 oz (55.248 kg).  LABORATORY DATA: Lab Results  Component Value Date   WBC 14.8* 02/27/2014   HGB 13.2 02/27/2014   HCT 40.1 02/27/2014   MCV 98.3* 02/27/2014   PLT 218 02/27/2014      Chemistry      Component Value Date/Time   NA 142 02/27/2014 1420   NA 138 01/18/2014 0640   K 4.6 02/27/2014 1420   K 4.5 01/18/2014 0640   CL 99 01/18/2014 0640   CO2 31* 02/27/2014 1420   CO2 26 01/18/2014 0640   BUN 22.9 02/27/2014 1420   BUN 19 01/18/2014 0640   CREATININE 0.8 02/27/2014 1420   CREATININE 0.63 01/18/2014 0640   CREATININE 0.80 01/12/2014 1519      Component Value Date/Time   CALCIUM 9.5 02/27/2014 1420   CALCIUM 9.0 01/18/2014 0640   ALKPHOS 114 02/27/2014 1420   ALKPHOS 88 01/18/2014 0640   AST 20 02/27/2014 1420   AST 31 01/18/2014 0640   ALT 23 02/27/2014 1420   ALT 18 01/18/2014 0640   BILITOT 0.24 02/27/2014 1420   BILITOT 0.4 01/18/2014 0640       RADIOGRAPHIC STUDIES: Dg Chest 2 View  01/18/2014   CLINICAL DATA:  Preop for bronchoscopy.  Lung nodule.  EXAM: CHEST  2 VIEW  COMPARISON:  CT chest 01/17/2014 and 01/03/2014.  Chest 12/06/2013  FINDINGS: 12 mm pulmonary nodule again demonstrated in the right mid lung. No change since previous study. Heart size and pulmonary vascularity are normal. Peribronchial thickening suggesting chronic bronchitis. Emphysematous changes in the lungs. No focal consolidation or airspace disease. No blunting of costophrenic angles. No pneumothorax. Degenerative changes in the spine.  IMPRESSION: Indeterminate nodule in the right mid lung. Emphysematous changes and chronic bronchitic changes. No focal consolidation.   Electronically Signed   By: Lucienne Capers M.D.   On: 01/18/2014 06:55   Dg Chest Port 1 View  01/18/2014   CLINICAL DATA:  Status post bronchoscopy.  EXAM: PORTABLE CHEST - 1 VIEW  COMPARISON:  01/18/2014 head CT chest 01/17/2014.  FINDINGS: Patient is rotated. Heart size normal. Thoracic aorta is  calcified. Biapical pleural parenchymal scarring, right greater than left. No definite pneumothorax after bronchoscopy. Right lower lobe nodule was better seen on 01/17/2014. No pleural fluid.  IMPRESSION: 1. No definite pneumothorax after bronchoscopy. 2. Right lower lobe nodule better visualized on 01/17/2014.   Electronically Signed   By: Lorin Picket M.D.   On: 01/18/2014 11:34   Ct Super D Chest Wo Contrast  01/17/2014   CLINICAL DATA:  Followup pulmonary nodule.  EXAM: CT CHEST WITHOUT CONTRAST (super D chest)  TECHNIQUE: Multidetector CT imaging of the chest was performed using thin slice collimation for electromagnetic bronchoscopy planning purposes, without intravenous contrast.  COMPARISON:  PET-CT 01/12/2014 and CT scan 01/03/2014.  FINDINGS: The chest wall is stable. No supraclavicular or axillary mass or adenopathy. No worrisome  bone lesions. Moderate osteoporosis.  The heart is normal in size. No pericardial effusion. Stable tortuosity, ectasia and calcification of the thoracic aorta. The esophagus is grossly normal.  Stable right hilar and infrahilar lymph nodes. These were metabolically active on the PET-CT.  The right lower lobe pulmonary nodule is stable. It measures approximately 12 by 7 mm.  No new lesions. Stable advanced emphysematous changes and dense apical scarring and calcification on the right.  The upper abdomen is unremarkable. Stable hyperdense/hemorrhagic renal cysts  IMPRESSION: Stable CT findings with a 12 mm superior segment right lower lobe pulmonary nodule and right hilar adenopathy.  Advanced emphysematous changes.   Electronically Signed   By: Kalman Jewels M.D.   On: 01/17/2014 16:01   Dg C-arm Bronchoscopy  01/18/2014   CLINICAL DATA: lung nodule   C-ARM BRONCHOSCOPY  Fluoroscopy was utilized by the requesting physician.  No radiographic  interpretation.     ASSESSMENT AND PLAN: This is a very pleasant 80 years old white male recently diagnosed with limited stage  small cell lung cancer and currently undergoing systemic chemotherapy with cisplatin and etoposide status post 1 cycle. He tolerated the first cycle of his treatment fairly well with no significant adverse effects. The patient was discussed with and also seen by Dr. Julien Nordmann. He'll proceed to cycle #2 his systemic chemotherapy with cisplatin and etoposide as scheduled. He will continue with weekly labs also schedule. He'll return in 3 weeks for another symptom management visit with a restaging CT scan of the chest with contrast to reevaluate his disease, prior to cycle #3. He was advised to call immediately if he has any concerning symptoms in the interval. The patient voices understanding of current disease status and treatment options and is in agreement with the current care plan.  All questions were answered. The patient knows to call the clinic with any problems, questions or concerns. We can certainly see the patient much sooner if necessary.  Carlton Adam PA-C  ADDENDUM: Hematology/Oncology Attending:  I had a face to face encounter with the patient. I recommended his care plan. This is a very pleasant 80 years old white male recently diagnosed with limited stage small cell lung cancer and currently undergoing systemic chemotherapy with cisplatin and etoposide status post 1 cycle. The patient tolerated the first cycle of his treatment fairly well with no significant adverse effects except for alopecia. He denied having any significant nausea or vomiting, no fever or chills. He denied having any significant weight loss or night sweats. I recommended for him to proceed with cycle #2 tomorrow as scheduled. He would come back for followup visit in 3 weeks with the next cycle of his treatment. He was advised to call immediately if he has any concerning symptoms in the interval.  Disclaimer: This note was dictated with voice recognition software. Similar sounding words can inadvertently be  transcribed and may not be corrected upon review. Eilleen Kempf., MD 03/03/2014

## 2014-03-02 NOTE — Patient Instructions (Signed)
Continue labs as scheduled Follow up in 3 weeks with a restaging CT scan of your chest to reevaluate your disease.

## 2014-03-03 ENCOUNTER — Ambulatory Visit (HOSPITAL_BASED_OUTPATIENT_CLINIC_OR_DEPARTMENT_OTHER): Payer: Medicare Other

## 2014-03-03 ENCOUNTER — Ambulatory Visit
Admission: RE | Admit: 2014-03-03 | Discharge: 2014-03-03 | Disposition: A | Discharge status: Home / Self Care | Payer: Medicare Other | Source: Ambulatory Visit | Attending: Radiation Oncology | Admitting: Radiation Oncology

## 2014-03-03 VITALS — BP 116/64 | HR 91 | Temp 97.7°F

## 2014-03-03 DIAGNOSIS — Z510 Encounter for antineoplastic radiation therapy: Secondary | ICD-10-CM | POA: Diagnosis not present

## 2014-03-03 DIAGNOSIS — Z5189 Encounter for other specified aftercare: Secondary | ICD-10-CM

## 2014-03-03 DIAGNOSIS — C7A1 Malignant poorly differentiated neuroendocrine tumors: Secondary | ICD-10-CM

## 2014-03-03 DIAGNOSIS — C3491 Malignant neoplasm of unspecified part of right bronchus or lung: Secondary | ICD-10-CM

## 2014-03-03 MED ORDER — PEGFILGRASTIM INJECTION 6 MG/0.6ML
6.0000 mg | Freq: Once | SUBCUTANEOUS | Status: AC
  Administered 2014-03-03: 6 mg via SUBCUTANEOUS
  Filled 2014-03-03: qty 0.6

## 2014-03-06 ENCOUNTER — Ambulatory Visit: Payer: Medicare Other | Admitting: Nutrition

## 2014-03-06 ENCOUNTER — Other Ambulatory Visit: Payer: Medicare Other

## 2014-03-06 ENCOUNTER — Ambulatory Visit
Admission: RE | Admit: 2014-03-06 | Discharge: 2014-03-06 | Disposition: A | Discharge status: Home / Self Care | Payer: Medicare Other | Source: Ambulatory Visit | Attending: Radiation Oncology | Admitting: Radiation Oncology

## 2014-03-06 DIAGNOSIS — Z510 Encounter for antineoplastic radiation therapy: Secondary | ICD-10-CM | POA: Diagnosis not present

## 2014-03-06 NOTE — Progress Notes (Signed)
80 year old male diagnosed with small cell lung cancer.  He is a patient of Dr. Earlie Server and Dr. [name].  Past medical history includes hypertension, hyperlipidemia, BPH, and kidney stones.  Medications include Xanax, Lipitor, Compazine.  Labs include glucose of 169 on September 28.  Height: 65 inches. Weight: 120 pounds October 12. Usual body weight: 130 pounds. BMI: 19.97.  Patient reports poor appetite and decreased taste.  He denies nausea, vomiting, constipation, and diarrhea.  Patient has increased fatigue.  He is drinking 2 bottles of oral nutrition supplements daily.  Sometimes he uses boost compact or boost plus.  Nutrition diagnosis: Unintended weight loss related to inadequate oral intake as evidenced by 10 pound weight loss from usual body weight.    Intervention: Educated patient to increase calories and protein in small, frequent meals and snacks. Recommended patient drink oral nutrition supplements 3 times a day between meals. Provided fact sheets on increasing calories and protein, and taste alterations. Questions were answered and teach back method used.  Monitoring, evaluation, goals: Patient will work to increase oral intake to promote weight gain to usual body weight.  Next visit: Tuesday, October 27, during chemotherapy.     **Disclaimer: This note was dictated with voice recognition software. Similar sounding words can inadvertently be transcribed and this note may contain transcription errors which may not have been corrected upon publication of note.**

## 2014-03-07 ENCOUNTER — Ambulatory Visit
Admission: RE | Admit: 2014-03-07 | Discharge: 2014-03-07 | Disposition: A | Discharge status: Home / Self Care | Payer: Medicare Other | Source: Ambulatory Visit | Attending: Radiation Oncology | Admitting: Radiation Oncology

## 2014-03-07 ENCOUNTER — Other Ambulatory Visit (HOSPITAL_BASED_OUTPATIENT_CLINIC_OR_DEPARTMENT_OTHER): Payer: Medicare Other

## 2014-03-07 ENCOUNTER — Other Ambulatory Visit: Payer: Self-pay | Admitting: Physician Assistant

## 2014-03-07 VITALS — BP 130/86 | HR 102 | Temp 97.4°F | Wt 118.6 lb

## 2014-03-07 DIAGNOSIS — Z510 Encounter for antineoplastic radiation therapy: Secondary | ICD-10-CM | POA: Diagnosis not present

## 2014-03-07 DIAGNOSIS — C3491 Malignant neoplasm of unspecified part of right bronchus or lung: Secondary | ICD-10-CM

## 2014-03-07 LAB — CBC WITH DIFFERENTIAL/PLATELET
BASO%: 0.2 % (ref 0.0–2.0)
Basophils Absolute: 0 10*3/uL (ref 0.0–0.1)
EOS ABS: 0 10*3/uL (ref 0.0–0.5)
EOS%: 0.2 % (ref 0.0–7.0)
HCT: 38.1 % — ABNORMAL LOW (ref 38.4–49.9)
HGB: 13.1 g/dL (ref 13.0–17.1)
LYMPH#: 0.5 10*3/uL — AB (ref 0.9–3.3)
LYMPH%: 4.2 % — ABNORMAL LOW (ref 14.0–49.0)
MCH: 33.3 pg (ref 27.2–33.4)
MCHC: 34.4 g/dL (ref 32.0–36.0)
MCV: 96.9 fL (ref 79.3–98.0)
MONO#: 0.5 10*3/uL (ref 0.1–0.9)
MONO%: 4.1 % (ref 0.0–14.0)
NEUT%: 91.3 % — ABNORMAL HIGH (ref 39.0–75.0)
NEUTROS ABS: 11.2 10*3/uL — AB (ref 1.5–6.5)
PLATELETS: 142 10*3/uL (ref 140–400)
RBC: 3.93 10*6/uL — ABNORMAL LOW (ref 4.20–5.82)
RDW: 14.2 % (ref 11.0–14.6)
WBC: 12.2 10*3/uL — AB (ref 4.0–10.3)

## 2014-03-07 LAB — COMPREHENSIVE METABOLIC PANEL (CC13)
ALT: 22 U/L (ref 0–55)
ANION GAP: 10 meq/L (ref 3–11)
AST: 17 U/L (ref 5–34)
Albumin: 3.3 g/dL — ABNORMAL LOW (ref 3.5–5.0)
Alkaline Phosphatase: 117 U/L (ref 40–150)
BUN: 29.6 mg/dL — ABNORMAL HIGH (ref 7.0–26.0)
CHLORIDE: 101 meq/L (ref 98–109)
CO2: 28 meq/L (ref 22–29)
Calcium: 9.2 mg/dL (ref 8.4–10.4)
Creatinine: 0.8 mg/dL (ref 0.7–1.3)
GLUCOSE: 112 mg/dL (ref 70–140)
Potassium: 4.3 mEq/L (ref 3.5–5.1)
SODIUM: 139 meq/L (ref 136–145)
TOTAL PROTEIN: 6.1 g/dL — AB (ref 6.4–8.3)
Total Bilirubin: 0.96 mg/dL (ref 0.20–1.20)

## 2014-03-07 LAB — MAGNESIUM (CC13): MAGNESIUM: 2 mg/dL (ref 1.5–2.5)

## 2014-03-07 NOTE — Progress Notes (Signed)
Weekly Management Note Current Dose: 40Gy  Projected Dose: 60 Gy   Narrative:  The patient presents for routine under treatment assessment.  CBCT/MVCT images/Port film x-rays were reviewed.  The chart was checked. Doing well. Drinking some Boost.  No esophagitis. Feeling lightheaded this morning and bone pain in back and hips after neulasta injection.   Physical Findings:  Unchanged. Alert and oriented.   Vitals:  Filed Vitals:   03/07/14 1455  BP: 130/86  Pulse: 102  Temp: 97.4 F (36.3 C)   Weight:  Wt Readings from Last 3 Encounters:  03/07/14 118 lb 9.6 oz (53.797 kg)  03/06/14 120 lb (54.432 kg)  03/01/14 125 lb (56.7 kg)   Lab Results  Component Value Date   WBC 12.2* 03/07/2014   HGB 13.1 03/07/2014   HCT 38.1* 03/07/2014   MCV 96.9 03/07/2014   PLT 142 03/07/2014   Lab Results  Component Value Date   CREATININE 0.8 03/07/2014   BUN 29.6* 03/07/2014   NA 139 03/07/2014   K 4.3 03/07/2014   CL 99 01/18/2014   CO2 28 03/07/2014     Impression:  The patient is tolerating radiation.  Plan:  Continue treatment as planned. Encourage PO. Hold norvasc for now due to low BP. Add claritin for neulasta injections. Met with dietician for weight loss.

## 2014-03-07 NOTE — Progress Notes (Signed)
Weekly assessment of radiation to right lung.Pain of right hip from neupogen injection.Skin is pink anteriorly and posteriorly.Blood pressure is fine now but states it was low 88/59 this morning at home..May have to hold hypertensive at bedtime if continues to be low.

## 2014-03-08 ENCOUNTER — Ambulatory Visit
Admission: RE | Admit: 2014-03-08 | Discharge: 2014-03-08 | Disposition: A | Discharge status: Home / Self Care | Payer: Medicare Other | Source: Ambulatory Visit | Attending: Radiation Oncology | Admitting: Radiation Oncology

## 2014-03-08 DIAGNOSIS — Z510 Encounter for antineoplastic radiation therapy: Secondary | ICD-10-CM | POA: Diagnosis not present

## 2014-03-09 ENCOUNTER — Ambulatory Visit
Admission: RE | Admit: 2014-03-09 | Discharge: 2014-03-09 | Disposition: A | Discharge status: Home / Self Care | Payer: Medicare Other | Source: Ambulatory Visit | Attending: Radiation Oncology | Admitting: Radiation Oncology

## 2014-03-09 DIAGNOSIS — Z510 Encounter for antineoplastic radiation therapy: Secondary | ICD-10-CM | POA: Diagnosis not present

## 2014-03-10 ENCOUNTER — Ambulatory Visit
Admission: RE | Admit: 2014-03-10 | Discharge: 2014-03-10 | Disposition: A | Discharge status: Home / Self Care | Payer: Medicare Other | Source: Ambulatory Visit | Attending: Radiation Oncology | Admitting: Radiation Oncology

## 2014-03-10 DIAGNOSIS — Z510 Encounter for antineoplastic radiation therapy: Secondary | ICD-10-CM | POA: Diagnosis not present

## 2014-03-13 ENCOUNTER — Ambulatory Visit
Admission: RE | Admit: 2014-03-13 | Discharge: 2014-03-13 | Disposition: A | Discharge status: Home / Self Care | Payer: Medicare Other | Source: Ambulatory Visit | Attending: Radiation Oncology | Admitting: Radiation Oncology

## 2014-03-13 ENCOUNTER — Other Ambulatory Visit: Payer: Medicare Other

## 2014-03-13 DIAGNOSIS — Z510 Encounter for antineoplastic radiation therapy: Secondary | ICD-10-CM | POA: Diagnosis not present

## 2014-03-14 ENCOUNTER — Encounter: Payer: Self-pay | Admitting: Radiation Oncology

## 2014-03-14 ENCOUNTER — Inpatient Hospital Stay
Admission: RE | Admit: 2014-03-14 | Discharge: 2014-03-14 | Disposition: A | Discharge status: Home / Self Care | Payer: Self-pay | Source: Ambulatory Visit | Attending: Radiation Oncology | Admitting: Radiation Oncology

## 2014-03-14 ENCOUNTER — Other Ambulatory Visit (HOSPITAL_BASED_OUTPATIENT_CLINIC_OR_DEPARTMENT_OTHER): Payer: Medicare Other

## 2014-03-14 ENCOUNTER — Ambulatory Visit
Admission: RE | Admit: 2014-03-14 | Discharge: 2014-03-14 | Disposition: A | Discharge status: Home / Self Care | Payer: Medicare Other | Source: Ambulatory Visit | Attending: Radiation Oncology | Admitting: Radiation Oncology

## 2014-03-14 VITALS — BP 126/76 | HR 111 | Temp 97.9°F | Ht 65.0 in | Wt 120.3 lb

## 2014-03-14 DIAGNOSIS — C3491 Malignant neoplasm of unspecified part of right bronchus or lung: Secondary | ICD-10-CM

## 2014-03-14 DIAGNOSIS — Z510 Encounter for antineoplastic radiation therapy: Secondary | ICD-10-CM | POA: Diagnosis not present

## 2014-03-14 DIAGNOSIS — C7A1 Malignant poorly differentiated neuroendocrine tumors: Secondary | ICD-10-CM

## 2014-03-14 LAB — CBC WITH DIFFERENTIAL/PLATELET
BASO%: 0.2 % (ref 0.0–2.0)
BASOS ABS: 0 10*3/uL (ref 0.0–0.1)
EOS%: 0.1 % (ref 0.0–7.0)
Eosinophils Absolute: 0 10*3/uL (ref 0.0–0.5)
HCT: 39.1 % (ref 38.4–49.9)
HGB: 12.8 g/dL — ABNORMAL LOW (ref 13.0–17.1)
LYMPH%: 4.1 % — ABNORMAL LOW (ref 14.0–49.0)
MCH: 32.3 pg (ref 27.2–33.4)
MCHC: 32.8 g/dL (ref 32.0–36.0)
MCV: 98.6 fL — ABNORMAL HIGH (ref 79.3–98.0)
MONO#: 1.4 10*3/uL — ABNORMAL HIGH (ref 0.1–0.9)
MONO%: 8.5 % (ref 0.0–14.0)
NEUT#: 14.6 10*3/uL — ABNORMAL HIGH (ref 1.5–6.5)
NEUT%: 87.1 % — ABNORMAL HIGH (ref 39.0–75.0)
Platelets: 86 10*3/uL — ABNORMAL LOW (ref 140–400)
RBC: 3.97 10*6/uL — AB (ref 4.20–5.82)
RDW: 14 % (ref 11.0–14.6)
WBC: 16.8 10*3/uL — ABNORMAL HIGH (ref 4.0–10.3)
lymph#: 0.7 10*3/uL — ABNORMAL LOW (ref 0.9–3.3)

## 2014-03-14 LAB — MAGNESIUM (CC13): Magnesium: 2.5 mg/dl (ref 1.5–2.5)

## 2014-03-14 LAB — COMPREHENSIVE METABOLIC PANEL (CC13)
ALBUMIN: 3.7 g/dL (ref 3.5–5.0)
ALK PHOS: 131 U/L (ref 40–150)
ALT: 19 U/L (ref 0–55)
AST: 21 U/L (ref 5–34)
Anion Gap: 11 mEq/L (ref 3–11)
BUN: 15.7 mg/dL (ref 7.0–26.0)
CO2: 29 mEq/L (ref 22–29)
Calcium: 9.8 mg/dL (ref 8.4–10.4)
Chloride: 102 mEq/L (ref 98–109)
Creatinine: 1 mg/dL (ref 0.7–1.3)
Glucose: 116 mg/dl (ref 70–140)
POTASSIUM: 4.3 meq/L (ref 3.5–5.1)
SODIUM: 142 meq/L (ref 136–145)
Total Bilirubin: 0.39 mg/dL (ref 0.20–1.20)
Total Protein: 6.7 g/dL (ref 6.4–8.3)

## 2014-03-14 NOTE — Progress Notes (Signed)
Mr. Capell has received 25 fractions to his right lung.  He denies any chest, shoulder or upper back pain today.  He reports SOB with only Strenuous activity.  O2 sat on RA is 99%.  His having pain in his right forearm and note hardened mild reddened area with tenderness when touching. He cannot remember if this area was accessed during chemotherapy on his last cycle on 03/02/14, but voices concern if this is a clot.  Will be accessed by Dr. Sondra Come.

## 2014-03-14 NOTE — Progress Notes (Signed)
  Radiation Oncology         [phone] ________________________________  Name: Patrick Carr MRN: [id number]  Date: 03/14/2014  DOB: 29-Nov-1933  Weekly Radiation Therapy Management  Small cell carcinoma of right lung   Primary site: Lung (Right)   Staging method: AJCC 7th Edition   Clinical: Stage IIA (T1a, N1, M0)    Summary: Stage IIA (T1a, N1, M0)   Current Dose: 50 Gy     Planned Dose:  60 Gy  Narrative . . . . . . . . The patient presents for routine under treatment assessment.                                   The patient is without complaint. He denies any swallowing difficulties,  breathing problems or pain within the chest. Patient has noticed some discomfort along his right forearm where he had a recent IV placed                                 Set-up films were reviewed.                                 The chart was checked. Physical Findings. . .  height is 5\' 5"  (1.651 m) and weight is 120 lb 4.8 oz (54.568 kg). His temperature is 97.9 F (36.6 C). His blood pressure is 126/76 and his pulse is 111. His oxygen saturation is 99%. . The lungs are clear. The heart has regular rhythm and rate. Examination right forearm reveals some mild erythema. No warmth or obvious sign of infection. The veins in this area appear somewhat hard to palpation.  The patient remembers having IV placed in this area recently but unsure if chemotherapy was given with this placement. Impression . . . . . . . The patient is tolerating radiation. Plan . . . . . . . . . . . . Continue treatment as planned. Medical oncology will be contacted concerning his right forearm area.  ________________________________   Blair Promise, PhD, MD

## 2014-03-15 ENCOUNTER — Ambulatory Visit
Admission: RE | Admit: 2014-03-15 | Discharge: 2014-03-15 | Disposition: A | Discharge status: Home / Self Care | Payer: Medicare Other | Source: Ambulatory Visit | Attending: Radiation Oncology | Admitting: Radiation Oncology

## 2014-03-15 DIAGNOSIS — Z510 Encounter for antineoplastic radiation therapy: Secondary | ICD-10-CM | POA: Diagnosis not present

## 2014-03-16 ENCOUNTER — Ambulatory Visit
Admission: RE | Admit: 2014-03-16 | Discharge: 2014-03-16 | Disposition: A | Discharge status: Home / Self Care | Payer: Medicare Other | Source: Ambulatory Visit | Attending: Radiation Oncology | Admitting: Radiation Oncology

## 2014-03-16 ENCOUNTER — Ambulatory Visit (HOSPITAL_BASED_OUTPATIENT_CLINIC_OR_DEPARTMENT_OTHER): Payer: Medicare Other | Admitting: Nurse Practitioner

## 2014-03-16 ENCOUNTER — Encounter: Payer: Self-pay | Admitting: Nurse Practitioner

## 2014-03-16 VITALS — BP 130/74 | HR 104 | Temp 97.7°F | Resp 18 | Ht 65.0 in | Wt 118.7 lb

## 2014-03-16 DIAGNOSIS — C3491 Malignant neoplasm of unspecified part of right bronchus or lung: Secondary | ICD-10-CM

## 2014-03-16 DIAGNOSIS — Z510 Encounter for antineoplastic radiation therapy: Secondary | ICD-10-CM | POA: Diagnosis not present

## 2014-03-16 DIAGNOSIS — I809 Phlebitis and thrombophlebitis of unspecified site: Secondary | ICD-10-CM

## 2014-03-16 DIAGNOSIS — C3430 Malignant neoplasm of lower lobe, unspecified bronchus or lung: Secondary | ICD-10-CM

## 2014-03-16 DIAGNOSIS — I808 Phlebitis and thrombophlebitis of other sites: Secondary | ICD-10-CM

## 2014-03-16 MED ORDER — CEPHALEXIN 500 MG PO CAPS: 500.0000 mg | ORAL_CAPSULE | Freq: Four times a day (QID) | ORAL | Status: DC

## 2014-03-16 NOTE — Assessment & Plan Note (Signed)
Patient has 2 different areas of erythema, edema, and mild warmth to his right upper extremity.  Given that patient has had multiple IV sticks to the right arm-will treat as phlebitis; will also cover with Keflex antibiotic as well.  Patient was advised to continue with cool/moist compresses to the site, occasional ibuprofen for inflammation, and antibiotics as prescribed.  Patient was encouraged to call if his symptoms  persist or worsen in any way.

## 2014-03-16 NOTE — Assessment & Plan Note (Signed)
Currently undergoing cisplatin/etoposide chemotherapy regimen.  Patient received cycle 2 of this regimen on 03/03/2014.  He will obtain a restaging CT with contrast of the chest tomorrow 03/17/2014.  He'll return for cycle 3 of the same chemotherapy regimen on 03/21/2014.[name]  Patient will also continue with radiation therapy on a Monday through Friday basis.  Patient reports his last day of radiation therapy is scheduled for 03/21/2014.

## 2014-03-16 NOTE — Progress Notes (Signed)
SYMPTOM MANAGEMENT CLINIC   HPI: Patrick Carr 80 y.o. male diagnosed with lung cancer.  Currently undergoing cisplatin/etoposide chemotherapy regimen; as well as radiation therapy.  Patient presented as a walk-in today with complaint of right upper extremity erythema, warmth, edema, and mild tenderness.  He states that he has had multiple IV sticks/attempts to the right arm within the past few weeks for his chemotherapy; and developed the erythema/warmth/edema and tenderness.  He denies any known injury or trauma to the arm.  He denies any other new symptoms whatsoever.  He denies any recent fevers or chills.  Arm Pain     CURRENT THERAPY: Upcoming Treatment Dates - LUNG SMALL CELL - LIMITED STAGE Cisplatin D1 / Etoposide D1-3 q21d Days with orders from any treatment category:  03/21/2014      SCHEDULING COMMUNICATION      palonosetron (ALOXI) injection 0.25 mg      Dexamethasone Sodium Phosphate (DECADRON) injection 12 mg      fosaprepitant (EMEND) 150 mg in sodium chloride 0.9 % 145 mL IVPB      CISplatin (PLATINOL) 94 mg in sodium chloride 0.9 % 250 mL chemo infusion      etoposide (VEPESID) 190 mg in sodium chloride 0.9 % 500 mL chemo infusion      sodium chloride 0.9 % injection 10 mL      heparin lock flush 100 unit/mL      heparin lock flush 100 unit/mL      alteplase (CATHFLO ACTIVASE) injection 2 mg      sodium chloride 0.9 % injection 3 mL      Hot Pack 1 packet      0.9 %  sodium chloride infusion      dextrose 5 % and 0.45% NaCl 1,000 mL with potassium chloride 20 mEq, magnesium sulfate 12 mEq, mannitol 12.5 g infusion      TREATMENT CONDITIONS 03/22/2014      SCHEDULING COMMUNICATION      dexamethasone (DECADRON) injection 10 mg      etoposide (VEPESID) 190 mg in sodium chloride 0.9 % 500 mL chemo infusion      sodium chloride 0.9 % injection 10 mL      heparin lock flush 100 unit/mL      heparin lock flush 100 unit/mL      alteplase (CATHFLO ACTIVASE)  injection 2 mg      sodium chloride 0.9 % injection 3 mL      Hot Pack 1 packet      0.9 %  sodium chloride infusion 03/23/2014      SCHEDULING COMMUNICATION      dexamethasone (DECADRON) injection 10 mg      etoposide (VEPESID) 190 mg in sodium chloride 0.9 % 500 mL chemo infusion      sodium chloride 0.9 % injection 10 mL      heparin lock flush 100 unit/mL      heparin lock flush 100 unit/mL      alteplase (CATHFLO ACTIVASE) injection 2 mg      sodium chloride 0.9 % injection 3 mL      Hot Pack 1 packet      0.9 %  sodium chloride infusion    Review of Systems  Constitutional: Negative.   HENT: Negative.   Eyes: Negative.   Respiratory: Negative.   Cardiovascular: Negative.   Gastrointestinal: Negative.   Genitourinary: Negative.   Musculoskeletal: Negative.   Skin: Negative for itching and rash.  Erythema/edema/warmth/tenderness to right upper . extremity  Neurological: Negative.   Endo/Heme/Allergies: Negative.   Psychiatric/Behavioral: Negative.   All other systems reviewed and are negative.   Past Medical History  Diagnosis Date  . Hypertension   . Hyperlipidemia   . BPH (benign prostatic hyperplasia)   . Lesion of right lung     RLL  . RBBB     no palpations  . Renal cysts, acquired, bilateral   . History of kidney stones   . Hernia, inguinal left   . History of blood transfusion     artery in sinus bleed    Past Surgical History  Procedure Laterality Date  . Sinus artery surgery      not surgery  . No past surgeries    . Video bronchoscopy with endobronchial navigation N/A 01/18/2014    Procedure: VIDEO BRONCHOSCOPY WITH ENDOBRONCHIAL NAVIGATION;  Surgeon: [name], MD;  Location: [facility] OR;  Service: Thoracic;  Laterality: N/A;  . Video bronchoscopy with endobronchial ultrasound N/A 01/18/2014    Procedure: VIDEO BRONCHOSCOPY WITH ENDOBRONCHIAL ULTRASOUND;  Surgeon: [name], MD;  Location: [place];  Service: Thoracic;  Laterality:  N/A;    has Lung nodule; Hypertension; Hyperlipidemia; BPH (benign prostatic hyperplasia); RBBB; Renal cysts, acquired, bilateral; Lesion of right lung; Small cell carcinoma of right lung; and Phlebitis on his problem list.     is allergic to other.    Medication List       This list is accurate as of: 03/16/14  6:13 PM.  Always use your most recent med list.               alfuzosin 10 MG 24 hr tablet  Commonly known as:  UROXATRAL  Take 10 mg by mouth daily.     ALPRAZolam 0.25 MG tablet  Commonly known as:  XANAX  Take 0.25 mg by mouth 3 (three) times daily as needed for anxiety.     amLODipine 5 MG tablet  Commonly known as:  NORVASC  Take 5 mg by mouth daily.     aspirin 81 MG tablet  Take 81 mg by mouth daily.     atorvastatin 40 MG tablet  Commonly known as:  LIPITOR  Take 40 mg by mouth daily.     cephALEXin 500 MG capsule  Commonly known as:  KEFLEX  Take 1 capsule (500 mg total) by mouth 4 (four) times daily.     losartan 50 MG tablet  Commonly known as:  COZAAR  Take 50 mg by mouth daily.     prochlorperazine 10 MG tablet  Commonly known as:  COMPAZINE  Take 1 tablet (10 mg total) by mouth every 6 (six) hours as needed for nausea or vomiting.     tamsulosin 0.4 MG Caps capsule  Commonly known as:  FLOMAX  Take 0.4 mg by mouth daily.         PHYSICAL EXAMINATION  Blood pressure 130/74, pulse 104, temperature 97.7 F (36.5 C), temperature source Oral, resp. rate 18, height 5\' 5"  (1.651 m), weight 118 lb 11.2 oz (53.842 kg).  Physical Exam  Nursing note and vitals reviewed. Constitutional: He is oriented to person, place, and time and well-developed, well-nourished, and in no distress.  HENT:  Head: Normocephalic.  Mouth/Throat: Oropharynx is clear and moist.  Eyes: Conjunctivae and EOM are normal. Pupils are equal, round, and reactive to light. No scleral icterus.  Neck: Normal range of motion. Neck supple. No JVD present. No tracheal  deviation present. No thyromegaly present.  Cardiovascular: Normal rate, regular rhythm, normal heart sounds and intact distal pulses.   Pulmonary/Chest: Effort normal and breath sounds normal. No respiratory distress.  Abdominal: Soft. Bowel sounds are normal. He exhibits no distension. There is no tenderness. There is no rebound.  Musculoskeletal: Normal range of motion. He exhibits edema and tenderness.  Lymphadenopathy:    He has no cervical adenopathy.  Neurological: He is alert and oriented to person, place, and time. Gait normal.  Skin: Skin is warm and dry. No rash noted. There is erythema.  Right upper extremity with erythema, mild edema/warmth/tenderness noted to area just distal to right elbow; and to area just above elbow.  No red streaks noted.  Patient with full range of motion.  All pulses are palpable in all extremities are warm.  Psychiatric: Affect normal.    LABORATORY DATA:. Appointment on 03/14/2014  Component Date Value Ref Range Status  . WBC 03/14/2014 16.8* 4.0 - 10.3 10e3/uL Final  . NEUT# 03/14/2014 14.6* 1.5 - 6.5 10e3/uL Final  . HGB 03/14/2014 12.8* 13.0 - 17.1 g/dL Final  . HCT 03/14/2014 39.1  38.4 - 49.9 % Final  . Platelets 03/14/2014 86* 140 - 400 10e3/uL Final  . MCV 03/14/2014 98.6* 79.3 - 98.0 fL Final  . MCH 03/14/2014 32.3  27.2 - 33.4 pg Final  . MCHC 03/14/2014 32.8  32.0 - 36.0 g/dL Final  . RBC 03/14/2014 3.97* 4.20 - 5.82 10e6/uL Final  . RDW 03/14/2014 14.0  11.0 - 14.6 % Final  . lymph# 03/14/2014 0.7* 0.9 - 3.3 10e3/uL Final  . MONO# 03/14/2014 1.4* 0.1 - 0.9 10e3/uL Final  . Eosinophils Absolute 03/14/2014 0.0  0.0 - 0.5 10e3/uL Final  . Basophils Absolute 03/14/2014 0.0  0.0 - 0.1 10e3/uL Final  . NEUT% 03/14/2014 87.1* 39.0 - 75.0 % Final  . LYMPH% 03/14/2014 4.1* 14.0 - 49.0 % Final  . MONO% 03/14/2014 8.5  0.0 - 14.0 % Final  . EOS% 03/14/2014 0.1  0.0 - 7.0 % Final  . BASO% 03/14/2014 0.2  0.0 - 2.0 % Final  . Sodium 03/14/2014  142  136 - 145 mEq/L Final  . Potassium 03/14/2014 4.3  3.5 - 5.1 mEq/L Final  . Chloride 03/14/2014 102  98 - 109 mEq/L Final  . CO2 03/14/2014 29  22 - 29 mEq/L Final  . Glucose 03/14/2014 116  70 - 140 mg/dl Final  . BUN 03/14/2014 15.7  7.0 - 26.0 mg/dL Final  . Creatinine 03/14/2014 1.0  0.7 - 1.3 mg/dL Final  . Total Bilirubin 03/14/2014 0.39  0.20 - 1.20 mg/dL Final  . Alkaline Phosphatase 03/14/2014 131  40 - 150 U/L Final  . AST 03/14/2014 21  5 - 34 U/L Final  . ALT 03/14/2014 19  0 - 55 U/L Final  . Total Protein 03/14/2014 6.7  6.4 - 8.3 g/dL Final  . Albumin 03/14/2014 3.7  3.5 - 5.0 g/dL Final  . Calcium 03/14/2014 9.8  8.4 - 10.4 mg/dL Final  . Anion Gap 03/14/2014 11  3 - 11 mEq/L Final  . Magnesium 03/14/2014 2.5  1.5 - 2.5 mg/dl Final     RADIOGRAPHIC STUDIES: No results found.  ASSESSMENT/PLAN:    Small cell carcinoma of right lung  Assessment & Plan Currently undergoing cisplatin/etoposide chemotherapy regimen.  Patient received cycle 2 of this regimen on 03/03/2014.  He will obtain a restaging CT with contrast of the chest tomorrow 03/17/2014.  He'll return for cycle  3 of the same chemotherapy regimen on 03/21/2014.[name]  Patient will also continue with radiation therapy on a Monday through Friday basis.  Patient reports his last day of radiation therapy is scheduled for 03/21/2014.   Phlebitis  Assessment & Plan Patient has 2 different areas of erythema, edema, and mild warmth to his right upper extremity.  Given that patient has had multiple IV sticks to the right arm-will treat as phlebitis; will also cover with Keflex antibiotic as well.  Patient was advised to continue with cool/moist compresses to the site, occasional ibuprofen for inflammation, and antibiotics as prescribed.  Patient was encouraged to call if his symptoms  persist or worsen in any way.   Patient stated understanding of all instructions; and was in agreement with this plan of care. The  patient knows to call the clinic with any problems, questions or concerns.   Review/collaboration with Dr. Julien Nordmann and Dr. [name] regarding all aspects of patient's visit today.   Total time spent with patient was 25 minutes;  with greater than 75 percent of that time spent in face to face counseling regarding his symptoms, and coordination of care and follow up.  Disclaimer: This note was dictated with voice recognition software. Similar sounding words can inadvertently be transcribed and may not be corrected upon review.   Drue Second, NP 03/16/2014

## 2014-03-17 ENCOUNTER — Other Ambulatory Visit: Payer: Self-pay | Admitting: *Deleted

## 2014-03-17 ENCOUNTER — Ambulatory Visit
Admission: RE | Admit: 2014-03-17 | Discharge: 2014-03-17 | Disposition: A | Discharge status: Home / Self Care | Payer: Medicare Other | Source: Ambulatory Visit | Attending: Radiation Oncology | Admitting: Radiation Oncology

## 2014-03-17 ENCOUNTER — Ambulatory Visit (HOSPITAL_COMMUNITY)
Admission: RE | Admit: 2014-03-17 | Discharge: 2014-03-17 | Disposition: A | Discharge status: Home / Self Care | Payer: Medicare Other | Source: Ambulatory Visit | Attending: Physician Assistant | Admitting: Physician Assistant

## 2014-03-17 DIAGNOSIS — C3491 Malignant neoplasm of unspecified part of right bronchus or lung: Secondary | ICD-10-CM | POA: Insufficient documentation

## 2014-03-17 DIAGNOSIS — Z510 Encounter for antineoplastic radiation therapy: Secondary | ICD-10-CM | POA: Diagnosis not present

## 2014-03-17 DIAGNOSIS — R599 Enlarged lymph nodes, unspecified: Secondary | ICD-10-CM | POA: Diagnosis not present

## 2014-03-17 DIAGNOSIS — R911 Solitary pulmonary nodule: Secondary | ICD-10-CM | POA: Diagnosis present

## 2014-03-17 MED ORDER — IOHEXOL 300 MG/ML  SOLN
80.0000 mL | Freq: Once | INTRAMUSCULAR | Status: AC | PRN
  Administered 2014-03-17: 80 mL via INTRAVENOUS

## 2014-03-19 ENCOUNTER — Other Ambulatory Visit: Payer: Self-pay | Admitting: Internal Medicine

## 2014-03-20 ENCOUNTER — Encounter: Payer: Self-pay | Admitting: Radiation Oncology

## 2014-03-20 ENCOUNTER — Ambulatory Visit
Admission: RE | Admit: 2014-03-20 | Discharge: 2014-03-20 | Disposition: A | Discharge status: Home / Self Care | Payer: Medicare Other | Source: Ambulatory Visit | Attending: Radiation Oncology | Admitting: Radiation Oncology

## 2014-03-20 ENCOUNTER — Ambulatory Visit: Payer: Medicare Other | Admitting: Physician Assistant

## 2014-03-20 ENCOUNTER — Other Ambulatory Visit: Payer: Medicare Other

## 2014-03-20 VITALS — BP 125/66 | HR 113 | Temp 97.4°F | Ht 65.0 in | Wt 120.0 lb

## 2014-03-20 DIAGNOSIS — Z510 Encounter for antineoplastic radiation therapy: Secondary | ICD-10-CM | POA: Diagnosis not present

## 2014-03-20 DIAGNOSIS — I809 Phlebitis and thrombophlebitis of unspecified site: Secondary | ICD-10-CM

## 2014-03-20 DIAGNOSIS — C3491 Malignant neoplasm of unspecified part of right bronchus or lung: Secondary | ICD-10-CM

## 2014-03-20 HISTORY — DX: Phlebitis and thrombophlebitis of unspecified site: I80.9

## 2014-03-20 NOTE — Progress Notes (Signed)
Weekly Management Note:  Site: Right lung Current Dose:  5600  cGy Projected Dose: 6000  cGy  Narrative: The patient is seen today for routine under treatment assessment. CBCT/MVCT images/port films were reviewed. The chart was reviewed.   He is seen today prior to his radiation therapy for his recent history of right upper extremity phlebitis. He was started on antibiotics and compresses. No fever or chills. The family feels that he is improved.  Physical Examination:  Filed Vitals:   03/20/14 1116  BP: 125/66  Pulse: 113  Temp: 97.4 F (36.3 C)  .  Weight: 120 lb (54.432 kg). There are 2 focal areas of raised erythema, one above, one below the elbow. These are nontender. They appear to be along the course of the vein.  Impression: Tolerating radiation therapy well. Suspected phlebitis is improved.  Plan: Continue radiation therapy as planned.

## 2014-03-21 ENCOUNTER — Ambulatory Visit
Admission: RE | Admit: 2014-03-21 | Discharge: 2014-03-21 | Disposition: A | Discharge status: Home / Self Care | Payer: Medicare Other | Source: Ambulatory Visit | Attending: Radiation Oncology | Admitting: Radiation Oncology

## 2014-03-21 ENCOUNTER — Ambulatory Visit: Payer: Medicare Other | Admitting: Nutrition

## 2014-03-21 ENCOUNTER — Encounter: Payer: Self-pay | Admitting: Radiation Oncology

## 2014-03-21 ENCOUNTER — Ambulatory Visit (HOSPITAL_BASED_OUTPATIENT_CLINIC_OR_DEPARTMENT_OTHER): Payer: Medicare Other

## 2014-03-21 ENCOUNTER — Encounter: Payer: Self-pay | Admitting: Physician Assistant

## 2014-03-21 ENCOUNTER — Other Ambulatory Visit (HOSPITAL_BASED_OUTPATIENT_CLINIC_OR_DEPARTMENT_OTHER): Payer: Medicare Other

## 2014-03-21 ENCOUNTER — Telehealth: Payer: Self-pay | Admitting: Internal Medicine

## 2014-03-21 ENCOUNTER — Ambulatory Visit (HOSPITAL_BASED_OUTPATIENT_CLINIC_OR_DEPARTMENT_OTHER): Payer: Medicare Other | Admitting: Physician Assistant

## 2014-03-21 VITALS — BP 138/77 | HR 108 | Temp 97.7°F | Resp 17 | Ht 65.0 in | Wt 121.2 lb

## 2014-03-21 DIAGNOSIS — C3491 Malignant neoplasm of unspecified part of right bronchus or lung: Secondary | ICD-10-CM

## 2014-03-21 DIAGNOSIS — Z5111 Encounter for antineoplastic chemotherapy: Secondary | ICD-10-CM | POA: Diagnosis not present

## 2014-03-21 DIAGNOSIS — C3430 Malignant neoplasm of lower lobe, unspecified bronchus or lung: Secondary | ICD-10-CM

## 2014-03-21 DIAGNOSIS — Z510 Encounter for antineoplastic radiation therapy: Secondary | ICD-10-CM | POA: Diagnosis not present

## 2014-03-21 LAB — CBC WITH DIFFERENTIAL/PLATELET
BASO%: 0.2 % (ref 0.0–2.0)
Basophils Absolute: 0 10*3/uL (ref 0.0–0.1)
EOS%: 0.2 % (ref 0.0–7.0)
Eosinophils Absolute: 0 10*3/uL (ref 0.0–0.5)
HCT: 37.7 % — ABNORMAL LOW (ref 38.4–49.9)
HGB: 12.5 g/dL — ABNORMAL LOW (ref 13.0–17.1)
LYMPH%: 2.5 % — AB (ref 14.0–49.0)
MCH: 32.7 pg (ref 27.2–33.4)
MCHC: 33.1 g/dL (ref 32.0–36.0)
MCV: 98.9 fL — AB (ref 79.3–98.0)
MONO#: 1.1 10*3/uL — ABNORMAL HIGH (ref 0.1–0.9)
MONO%: 7.7 % (ref 0.0–14.0)
NEUT#: 12.7 10*3/uL — ABNORMAL HIGH (ref 1.5–6.5)
NEUT%: 89.4 % — AB (ref 39.0–75.0)
PLATELETS: 185 10*3/uL (ref 140–400)
RBC: 3.81 10*6/uL — ABNORMAL LOW (ref 4.20–5.82)
RDW: 15.1 % — ABNORMAL HIGH (ref 11.0–14.6)
WBC: 14.2 10*3/uL — ABNORMAL HIGH (ref 4.0–10.3)
lymph#: 0.3 10*3/uL — ABNORMAL LOW (ref 0.9–3.3)

## 2014-03-21 LAB — COMPREHENSIVE METABOLIC PANEL (CC13)
ALT: 20 U/L (ref 0–55)
AST: 21 U/L (ref 5–34)
Albumin: 3.7 g/dL (ref 3.5–5.0)
Alkaline Phosphatase: 104 U/L (ref 40–150)
Anion Gap: 9 mEq/L (ref 3–11)
BILIRUBIN TOTAL: 0.31 mg/dL (ref 0.20–1.20)
BUN: 20.2 mg/dL (ref 7.0–26.0)
CO2: 28 mEq/L (ref 22–29)
CREATININE: 0.7 mg/dL (ref 0.7–1.3)
Calcium: 9.6 mg/dL (ref 8.4–10.4)
Chloride: 106 mEq/L (ref 98–109)
Glucose: 122 mg/dl (ref 70–140)
Potassium: 4.2 mEq/L (ref 3.5–5.1)
Sodium: 143 mEq/L (ref 136–145)
Total Protein: 6.8 g/dL (ref 6.4–8.3)

## 2014-03-21 LAB — MAGNESIUM (CC13): Magnesium: 2.3 mg/dl (ref 1.5–2.5)

## 2014-03-21 MED ORDER — DEXAMETHASONE SODIUM PHOSPHATE 20 MG/5ML IJ SOLN
12.0000 mg | Freq: Once | INTRAMUSCULAR | Status: AC
  Administered 2014-03-21: 12 mg via INTRAVENOUS

## 2014-03-21 MED ORDER — SODIUM CHLORIDE 0.9 % IV SOLN
60.0000 mg/m2 | Freq: Once | INTRAVENOUS | Status: AC
  Administered 2014-03-21: 94 mg via INTRAVENOUS
  Filled 2014-03-21: qty 94

## 2014-03-21 MED ORDER — PALONOSETRON HCL INJECTION 0.25 MG/5ML
INTRAVENOUS | Status: AC
  Filled 2014-03-21: qty 5

## 2014-03-21 MED ORDER — SODIUM CHLORIDE 0.9 % IV SOLN
120.0000 mg/m2 | Freq: Once | INTRAVENOUS | Status: AC
  Administered 2014-03-21: 190 mg via INTRAVENOUS
  Filled 2014-03-21: qty 9.5

## 2014-03-21 MED ORDER — POTASSIUM CHLORIDE 2 MEQ/ML IV SOLN
Freq: Once | INTRAVENOUS | Status: AC
  Administered 2014-03-21: 11:00:00 via INTRAVENOUS
  Filled 2014-03-21: qty 10

## 2014-03-21 MED ORDER — SODIUM CHLORIDE 0.9 % IV SOLN
150.0000 mg | Freq: Once | INTRAVENOUS | Status: AC
  Administered 2014-03-21: 150 mg via INTRAVENOUS
  Filled 2014-03-21: qty 5

## 2014-03-21 MED ORDER — SODIUM CHLORIDE 0.9 % IV SOLN
Freq: Once | INTRAVENOUS | Status: AC
  Administered 2014-03-21: 10:00:00 via INTRAVENOUS

## 2014-03-21 MED ORDER — PALONOSETRON HCL INJECTION 0.25 MG/5ML
0.2500 mg | Freq: Once | INTRAVENOUS | Status: AC
  Administered 2014-03-21: 0.25 mg via INTRAVENOUS

## 2014-03-21 MED ORDER — DEXAMETHASONE SODIUM PHOSPHATE 20 MG/5ML IJ SOLN
INTRAMUSCULAR | Status: AC
  Filled 2014-03-21: qty 5

## 2014-03-21 NOTE — Telephone Encounter (Signed)
[facility] PT DTR APPT SCHEDULE FOR OCT THRU NOV.

## 2014-03-21 NOTE — Progress Notes (Signed)
Nutrition followup completed with patient in chemotherapy.  He reports taste alterations have improved.  He continues to have a poor appetite and tries to force himself to eat.  He is drinking oral nutrition supplements often throughout the day.  Weight increased slightly and documented as 121 pounds.  Nutrition diagnosis: Unintended weight loss improved.  Intervention: I enforced education to continue increased calories and protein in small, frequent meals and snacks.  Patient to continue oral nutrition supplements. Questions were answered and teach back method used.  Monitoring, evaluation, goals: Patient will continue to work to increase oral intake to promote weight gain.  Next visit: Tuesday, November 17, during chemotherapy.  **Disclaimer: This note was dictated with voice recognition software. Similar sounding words can inadvertently be transcribed and this note may contain transcription errors which may not have been corrected upon publication of note.**

## 2014-03-21 NOTE — Progress Notes (Signed)
[place] [phone]   [phone]  OFFICE PROGRESS NOTE  Patrick Pel, MD [address] Patrick Carr [address]  DIAGNOSIS: Limited stage small cell lung cancer, stage IIA (T1a, N1, M0) presented with right lower lobe nodule in addition to right hilar lymphadenopathy diagnosed in August of 2015.  PRIOR THERAPY: None.  CURRENT THERAPY: Systemic chemotherapy with cisplatin 60 mg/M2 on day 1 and etoposide at 120 mg/M2 on days 1, 2 and 3 with Neulasta support on day 4. Status post 1 cycle.  INTERVAL HISTORY: Patrick Carr 80 y.o. male returns to the clinic today for followup visit accompanied by his wife.  The patient tolerated his first 2 cycles of his treatment with cisplatin and etoposide fairly well with no significant adverse effects. He did have some pain in his hips for about a day, after receiving the Neulasta injection. He noticed some redness and a raised nodule on the right forearm in the region where he had his IV placed. There is decreased redness and warmth per the patient and his wife. He denied having any fever or chills. He has no nausea or vomiting. He denied having any significant chest pain, shortness breath, cough or hemoptysis. He has no significant weight loss or night sweats. He presents to proceed with cycle #3. He recently had a restaging CT scan of the chest with contrast and presents to discuss the results.  MEDICAL HISTORY: Past Medical History  Diagnosis Date  . Hypertension   . Hyperlipidemia   . BPH (benign prostatic hyperplasia)   . Lesion of right lung     RLL  . RBBB     no palpations  . Renal cysts, acquired, bilateral   . History of kidney stones   . Hernia, inguinal left   . History of blood transfusion     artery in sinus bleed    ALLERGIES:  is allergic to other.  MEDICATIONS:  Current Outpatient Prescriptions  Medication Sig Dispense Refill  . alfuzosin (UROXATRAL) 10 MG  24 hr tablet Take 10 mg by mouth daily.      [name] amLODipine (NORVASC) 5 MG tablet Take 5 mg by mouth daily.      [name] aspirin 81 MG tablet Take 81 mg by mouth daily.      [name] atorvastatin (LIPITOR) 40 MG tablet Take 40 mg by mouth daily.      . cephALEXin (KEFLEX) 500 MG capsule Take 1 capsule (500 mg total) by mouth 4 (four) times daily.  40 capsule  0  . losartan (COZAAR) 50 MG tablet Take 50 mg by mouth daily.      . tamsulosin (FLOMAX) 0.4 MG CAPS capsule Take 0.4 mg by mouth daily.      [name] ALPRAZolam (XANAX) 0.25 MG tablet Take 0.25 mg by mouth 3 (three) times daily as needed for anxiety.      . prochlorperazine (COMPAZINE) 10 MG tablet Take 1 tablet (10 mg total) by mouth every 6 (six) hours as needed for nausea or vomiting.  30 tablet  0   No current facility-administered medications for this visit.    SURGICAL HISTORY:  Past Surgical History  Procedure Laterality Date  . Sinus artery surgery      not surgery  . No past surgeries    . Video bronchoscopy with endobronchial navigation N/A 01/18/2014    Procedure: VIDEO BRONCHOSCOPY WITH ENDOBRONCHIAL NAVIGATION;  Surgeon: [name], MD;  Location: [place];  Service: Thoracic;  Laterality: N/A;  . Video bronchoscopy with endobronchial ultrasound N/A 01/18/2014    Procedure: VIDEO BRONCHOSCOPY WITH ENDOBRONCHIAL ULTRASOUND;  Surgeon: [name], MD;  Location: [facility] OR;  Service: Thoracic;  Laterality: N/A;    REVIEW OF SYSTEMS:  A comprehensive review of systems was negative.   PHYSICAL EXAMINATION: General appearance: alert, cooperative and no distress Head: Normocephalic, without obvious abnormality, atraumatic Neck: no adenopathy, no JVD, supple, symmetrical, trachea midline and thyroid not enlarged, symmetric, no tenderness/mass/nodules Lymph nodes: Cervical, supraclavicular, and axillary nodes normal. Resp: clear to auscultation bilaterally Back: symmetric, no curvature. ROM normal. No CVA tenderness. Cardio: regular rate  and rhythm, S1, S2 normal, no murmur, click, rub or gallop GI: soft, non-tender; bowel sounds normal; no masses,  no organomegaly Extremities: extremities normal, atraumatic, no cyanosis or edema Skin: Right forearm there is a proximal half centimeter nodule distal to the elbow with mild erythema is nontender likely related to superficial phlebitis. There is no purulent or serosanguineous drainage, no warmth present  ECOG PERFORMANCE STATUS: 1 - Symptomatic but completely ambulatory  Blood pressure 138/77, pulse 108, temperature 97.7 F (36.5 C), temperature source Oral, resp. rate 17, height 5\' 5"  (1.651 m), weight 121 lb 3.2 oz (54.976 kg), SpO2 97.00%.  LABORATORY DATA: Lab Results  Component Value Date   WBC 14.2* 03/21/2014   HGB 12.5* 03/21/2014   HCT 37.7* 03/21/2014   MCV 98.9* 03/21/2014   PLT 185 03/21/2014      Chemistry      Component Value Date/Time   NA 143 03/21/2014 0914   NA 138 01/18/2014 0640   K 4.2 03/21/2014 0914   K 4.5 01/18/2014 0640   CL 99 01/18/2014 0640   CO2 28 03/21/2014 0914   CO2 26 01/18/2014 0640   BUN 20.2 03/21/2014 0914   BUN 19 01/18/2014 0640   CREATININE 0.7 03/21/2014 0914   CREATININE 0.63 01/18/2014 0640   CREATININE 0.80 01/12/2014 1519      Component Value Date/Time   CALCIUM 9.6 03/21/2014 0914   CALCIUM 9.0 01/18/2014 0640   ALKPHOS 104 03/21/2014 0914   ALKPHOS 88 01/18/2014 0640   AST 21 03/21/2014 0914   AST 31 01/18/2014 0640   ALT 20 03/21/2014 0914   ALT 18 01/18/2014 0640   BILITOT 0.31 03/21/2014 0914   BILITOT 0.4 01/18/2014 0640       RADIOGRAPHIC STUDIES: Ct Chest W Contrast  03/17/2014   CLINICAL DATA:  Restaging evaluation. Status post chemotherapy with radiation therapy in progress.  EXAM: CT CHEST WITH CONTRAST  TECHNIQUE: Multidetector CT imaging of the chest was performed during intravenous contrast administration.  CONTRAST:  39mL OMNIPAQUE IOHEXOL 300 MG/ML  SOLN  COMPARISON:  Chest CT 01/17/2014  FINDINGS:  Visualized thyroid is unremarkable. No enlarged axillary, mediastinal or hilar lymphadenopathy. Normal heart size. No pericardial effusion. Normal caliber aorta and main pulmonary artery.  Central airways are patent. Diffuse emphysematous change. Slight interval decrease in size of right lower lobe spiculated nodule measuring 11 x 6 mm, previously 12 x 7 mm. Unchanged right apical pleural parenchymal scarring. No pleural effusion pneumothorax.  Visualization of the upper abdomen demonstrates a stable subcentimeter low-attenuation lesion within the left hepatic lobe, too small to accurately characterize. No aggressive or acute appearing osseous lesions. Degenerative changes of the visualized thoracic spine.  IMPRESSION: Slight interval decrease in size of 11 mm right lower lobe spiculated nodule. Interval decrease in previously described right hilar adenopathy.   Electronically Signed  By: Lovey Newcomer M.D.   On: 03/17/2014 17:30   ASSESSMENT AND PLAN: This is a very pleasant 80 years old white male recently diagnosed with limited stage small cell lung cancer and currently undergoing systemic chemotherapy with cisplatin and etoposide status post 2 cycles. Overall is tolerating his chemotherapy relatively well without significant adverse effects. Regarding his right forearm irritation, the patient is asked to apply warm compresses. We'll continue to observe this area closely. Should his peripheral accidents remain problematic, patient may need a Port-A-Cath placed in the future. His restaging CT scan revealed slight decrease in the right lower lung nodule and decrease in the right hilar adenopathy. These results were reviewed with patient and his wife. He will proceed with cycle #3 of his systemic chemotherapy with cisplatin and etoposide with Neulasta support. Patient was advised to take Claritin surrounding his Neulasta injection and he also may take some ibuprofen 400 mg by mouth 2-3 times daily for 3-4 days.  Should he need something stronger yeast contact our office. He will continue with weekly labs as scheduled. He'll return in 3 weeks prior to the start of cycle #4.  He was advised to call immediately if he has any concerning symptoms in the interval. The patient voices understanding of current disease status and treatment options and is in agreement with the current care plan.  All questions were answered. The patient knows to call the clinic with any problems, questions or concerns. We can certainly see the patient much sooner if necessary.   Disclaimer: This note was dictated with voice recognition software. Similar sounding words can inadvertently be transcribed and may not be corrected upon review. Carlton Adam, PA-C 03/21/2014

## 2014-03-21 NOTE — Patient Instructions (Signed)
[name] Discharge Instructions for Patients Receiving Chemotherapy  Today you received the following chemotherapy agents Cisplatin and Etoposide.  To help prevent nausea and vomiting after your treatment, we encourage you to take your nausea medication as prescribed.   If you develop nausea and vomiting that is not controlled by your nausea medication, call the clinic.   BELOW ARE SYMPTOMS THAT SHOULD BE REPORTED IMMEDIATELY:  *FEVER GREATER THAN 100.5 F  *CHILLS WITH OR WITHOUT FEVER  NAUSEA AND VOMITING THAT IS NOT CONTROLLED WITH YOUR NAUSEA MEDICATION  *UNUSUAL SHORTNESS OF BREATH  *UNUSUAL BRUISING OR BLEEDING  TENDERNESS IN MOUTH AND THROAT WITH OR WITHOUT PRESENCE OF ULCERS  *URINARY PROBLEMS  *BOWEL PROBLEMS  UNUSUAL RASH Items with * indicate a potential emergency and should be followed up as soon as possible.  Feel free to call the clinic you have any questions or concerns. The clinic phone number is [phone].

## 2014-03-22 ENCOUNTER — Ambulatory Visit (HOSPITAL_BASED_OUTPATIENT_CLINIC_OR_DEPARTMENT_OTHER): Payer: Medicare Other

## 2014-03-22 VITALS — BP 130/70 | HR 106 | Temp 98.0°F | Resp 20

## 2014-03-22 DIAGNOSIS — C3430 Malignant neoplasm of lower lobe, unspecified bronchus or lung: Secondary | ICD-10-CM

## 2014-03-22 DIAGNOSIS — C3491 Malignant neoplasm of unspecified part of right bronchus or lung: Secondary | ICD-10-CM

## 2014-03-22 DIAGNOSIS — Z5111 Encounter for antineoplastic chemotherapy: Secondary | ICD-10-CM

## 2014-03-22 MED ORDER — SODIUM CHLORIDE 0.9 % IV SOLN
120.0000 mg/m2 | Freq: Once | INTRAVENOUS | Status: AC
  Administered 2014-03-22: 190 mg via INTRAVENOUS
  Filled 2014-03-22: qty 9.5

## 2014-03-22 MED ORDER — DEXAMETHASONE SODIUM PHOSPHATE 10 MG/ML IJ SOLN
10.0000 mg | Freq: Once | INTRAMUSCULAR | Status: AC
  Administered 2014-03-22: 10 mg via INTRAVENOUS

## 2014-03-22 MED ORDER — DEXAMETHASONE SODIUM PHOSPHATE 10 MG/ML IJ SOLN
INTRAMUSCULAR | Status: AC
  Filled 2014-03-22: qty 1

## 2014-03-22 MED ORDER — SODIUM CHLORIDE 0.9 % IV SOLN
Freq: Once | INTRAVENOUS | Status: AC
  Administered 2014-03-22: 11:00:00 via INTRAVENOUS

## 2014-03-22 NOTE — Patient Instructions (Signed)
[facility] Discharge Instructions for Patients Receiving Chemotherapy  Today you received the following chemotherapy agents Etoposide  To help prevent nausea and vomiting after your treatment, we encourage you to take your nausea medication as directed   If you develop nausea and vomiting that is not controlled by your nausea medication, call the clinic.   BELOW ARE SYMPTOMS THAT SHOULD BE REPORTED IMMEDIATELY:  *FEVER GREATER THAN 100.5 F  *CHILLS WITH OR WITHOUT FEVER  NAUSEA AND VOMITING THAT IS NOT CONTROLLED WITH YOUR NAUSEA MEDICATION  *UNUSUAL SHORTNESS OF BREATH  *UNUSUAL BRUISING OR BLEEDING  TENDERNESS IN MOUTH AND THROAT WITH OR WITHOUT PRESENCE OF ULCERS  *URINARY PROBLEMS  *BOWEL PROBLEMS  UNUSUAL RASH Items with * indicate a potential emergency and should be followed up as soon as possible.  Feel free to call the clinic you have any questions or concerns. The clinic phone number is [phone].

## 2014-03-23 ENCOUNTER — Ambulatory Visit (HOSPITAL_BASED_OUTPATIENT_CLINIC_OR_DEPARTMENT_OTHER): Payer: Medicare Other

## 2014-03-23 VITALS — BP 143/67 | HR 93 | Temp 97.0°F | Resp 18

## 2014-03-23 DIAGNOSIS — C3491 Malignant neoplasm of unspecified part of right bronchus or lung: Secondary | ICD-10-CM

## 2014-03-23 DIAGNOSIS — Z5111 Encounter for antineoplastic chemotherapy: Secondary | ICD-10-CM

## 2014-03-23 DIAGNOSIS — C3430 Malignant neoplasm of lower lobe, unspecified bronchus or lung: Secondary | ICD-10-CM

## 2014-03-23 MED ORDER — SODIUM CHLORIDE 0.9 % IV SOLN
120.0000 mg/m2 | Freq: Once | INTRAVENOUS | Status: AC
  Administered 2014-03-23: 190 mg via INTRAVENOUS
  Filled 2014-03-23: qty 9.5

## 2014-03-23 MED ORDER — DEXAMETHASONE SODIUM PHOSPHATE 10 MG/ML IJ SOLN
10.0000 mg | Freq: Once | INTRAMUSCULAR | Status: AC
  Administered 2014-03-23: 10 mg via INTRAVENOUS

## 2014-03-23 MED ORDER — SODIUM CHLORIDE 0.9 % IV SOLN
Freq: Once | INTRAVENOUS | Status: AC
  Administered 2014-03-23: 12:00:00 via INTRAVENOUS

## 2014-03-23 MED ORDER — DEXAMETHASONE SODIUM PHOSPHATE 10 MG/ML IJ SOLN
INTRAMUSCULAR | Status: AC
  Filled 2014-03-23: qty 1

## 2014-03-23 NOTE — Patient Instructions (Signed)
[facility] Discharge Instructions for Patients Receiving Chemotherapy  Today you received the following chemotherapy agents: Etoposide  To help prevent nausea and vomiting after your treatment, we encourage you to take your nausea medication as prescribed by your physician.   If you develop nausea and vomiting that is not controlled by your nausea medication, call the clinic.   BELOW ARE SYMPTOMS THAT SHOULD BE REPORTED IMMEDIATELY:  *FEVER GREATER THAN 100.5 F  *CHILLS WITH OR WITHOUT FEVER  NAUSEA AND VOMITING THAT IS NOT CONTROLLED WITH YOUR NAUSEA MEDICATION  *UNUSUAL SHORTNESS OF BREATH  *UNUSUAL BRUISING OR BLEEDING  TENDERNESS IN MOUTH AND THROAT WITH OR WITHOUT PRESENCE OF ULCERS  *URINARY PROBLEMS  *BOWEL PROBLEMS  UNUSUAL RASH Items with * indicate a potential emergency and should be followed up as soon as possible.  Feel free to call the clinic you have any questions or concerns. The clinic phone number is [phone].

## 2014-03-24 ENCOUNTER — Ambulatory Visit (HOSPITAL_BASED_OUTPATIENT_CLINIC_OR_DEPARTMENT_OTHER): Payer: Medicare Other

## 2014-03-24 VITALS — BP 150/91 | HR 100 | Temp 96.9°F | Resp 18

## 2014-03-24 DIAGNOSIS — Z5189 Encounter for other specified aftercare: Secondary | ICD-10-CM

## 2014-03-24 DIAGNOSIS — C7A1 Malignant poorly differentiated neuroendocrine tumors: Secondary | ICD-10-CM

## 2014-03-24 DIAGNOSIS — C3430 Malignant neoplasm of lower lobe, unspecified bronchus or lung: Secondary | ICD-10-CM

## 2014-03-24 DIAGNOSIS — C3491 Malignant neoplasm of unspecified part of right bronchus or lung: Secondary | ICD-10-CM

## 2014-03-24 MED ORDER — PEGFILGRASTIM INJECTION 6 MG/0.6ML
6.0000 mg | Freq: Once | SUBCUTANEOUS | Status: AC
  Administered 2014-03-24: 6 mg via SUBCUTANEOUS
  Filled 2014-03-24: qty 0.6

## 2014-03-24 NOTE — Patient Instructions (Signed)
Your CT scan showed slight improvement in your disease. Continue labs and chemotherapy as scheduled   follow-up in 3 weeks prior to the start of your next cycle of chemotherapy

## 2014-03-26 NOTE — Progress Notes (Signed)
  Radiation Oncology         [phone] ________________________________  Name: Patrick Carr MRN: [id number]  Date: 03/21/2014  DOB: 02-07-1934  End of Treatment Note  Diagnosis:   Stage III lung cancer     Indication for treatment:  Curative       Radiation treatment dates:  02/08/2014-03/21/2014  Site/dose:  60 Gy at 2 Gy per fraction x 30 fractions to the right lung with concurrent chemotherapy.   Beams/energy:   A 5 field plan with 6 and 10 MV photons was used to deliver the dose. Daily cone beam CT was used for image guidance.   Narrative: The patient tolerated radiation treatment relatively well.   He had diminished appetite related to treatment and hypotension for which his blood pressure medication was held. He maintained his weight well with dietary supplementation.   Plan: The patient has completed radiation treatment. The patient will return to radiation oncology clinic for routine followup in one month. I advised them to call or return sooner if they have any questions or concerns related to their recovery or treatment.  ------------------------------------------------  Thea Silversmith, MD

## 2014-03-28 ENCOUNTER — Other Ambulatory Visit (HOSPITAL_BASED_OUTPATIENT_CLINIC_OR_DEPARTMENT_OTHER): Payer: Medicare Other

## 2014-03-28 DIAGNOSIS — C3491 Malignant neoplasm of unspecified part of right bronchus or lung: Secondary | ICD-10-CM

## 2014-03-28 DIAGNOSIS — C3430 Malignant neoplasm of lower lobe, unspecified bronchus or lung: Secondary | ICD-10-CM

## 2014-03-28 LAB — COMPREHENSIVE METABOLIC PANEL (CC13)
ALT: 18 U/L (ref 0–55)
AST: 18 U/L (ref 5–34)
Albumin: 3.6 g/dL (ref 3.5–5.0)
Alkaline Phosphatase: 118 U/L (ref 40–150)
Anion Gap: 10 mEq/L (ref 3–11)
BUN: 33.9 mg/dL — AB (ref 7.0–26.0)
CALCIUM: 9.4 mg/dL (ref 8.4–10.4)
CHLORIDE: 104 meq/L (ref 98–109)
CO2: 30 mEq/L — ABNORMAL HIGH (ref 22–29)
Creatinine: 0.8 mg/dL (ref 0.7–1.3)
GLUCOSE: 99 mg/dL (ref 70–140)
POTASSIUM: 4.3 meq/L (ref 3.5–5.1)
SODIUM: 144 meq/L (ref 136–145)
TOTAL PROTEIN: 6.4 g/dL (ref 6.4–8.3)
Total Bilirubin: 0.73 mg/dL (ref 0.20–1.20)

## 2014-03-28 LAB — CBC WITH DIFFERENTIAL/PLATELET
BASO%: 0.2 % (ref 0.0–2.0)
BASOS ABS: 0 10*3/uL (ref 0.0–0.1)
EOS%: 0.1 % (ref 0.0–7.0)
Eosinophils Absolute: 0 10*3/uL (ref 0.0–0.5)
HCT: 35.6 % — ABNORMAL LOW (ref 38.4–49.9)
HEMOGLOBIN: 12.1 g/dL — AB (ref 13.0–17.1)
LYMPH%: 4.2 % — ABNORMAL LOW (ref 14.0–49.0)
MCH: 33.5 pg — ABNORMAL HIGH (ref 27.2–33.4)
MCHC: 34 g/dL (ref 32.0–36.0)
MCV: 98.6 fL — AB (ref 79.3–98.0)
MONO#: 0.2 10*3/uL (ref 0.1–0.9)
MONO%: 2.6 % (ref 0.0–14.0)
NEUT#: 8.3 10*3/uL — ABNORMAL HIGH (ref 1.5–6.5)
NEUT%: 92.9 % — ABNORMAL HIGH (ref 39.0–75.0)
Platelets: 153 10*3/uL (ref 140–400)
RBC: 3.61 10*6/uL — ABNORMAL LOW (ref 4.20–5.82)
RDW: 16.4 % — AB (ref 11.0–14.6)
WBC: 9 10*3/uL (ref 4.0–10.3)
lymph#: 0.4 10*3/uL — ABNORMAL LOW (ref 0.9–3.3)

## 2014-03-28 LAB — MAGNESIUM (CC13): Magnesium: 2.4 mg/dl (ref 1.5–2.5)

## 2014-04-04 ENCOUNTER — Other Ambulatory Visit (HOSPITAL_BASED_OUTPATIENT_CLINIC_OR_DEPARTMENT_OTHER): Payer: Medicare Other

## 2014-04-04 ENCOUNTER — Telehealth: Payer: Self-pay | Admitting: *Deleted

## 2014-04-04 ENCOUNTER — Ambulatory Visit (HOSPITAL_BASED_OUTPATIENT_CLINIC_OR_DEPARTMENT_OTHER): Payer: Medicare Other | Admitting: Nurse Practitioner

## 2014-04-04 VITALS — BP 121/62 | HR 108 | Temp 97.8°F | Resp 18 | Ht 65.0 in | Wt 121.1 lb

## 2014-04-04 DIAGNOSIS — C3491 Malignant neoplasm of unspecified part of right bronchus or lung: Secondary | ICD-10-CM

## 2014-04-04 DIAGNOSIS — I809 Phlebitis and thrombophlebitis of unspecified site: Secondary | ICD-10-CM

## 2014-04-04 DIAGNOSIS — C7A1 Malignant poorly differentiated neuroendocrine tumors: Secondary | ICD-10-CM

## 2014-04-04 DIAGNOSIS — I808 Phlebitis and thrombophlebitis of other sites: Secondary | ICD-10-CM

## 2014-04-04 LAB — CBC WITH DIFFERENTIAL/PLATELET
BASO%: 0.3 % (ref 0.0–2.0)
BASOS ABS: 0 10*3/uL (ref 0.0–0.1)
EOS ABS: 0.1 10*3/uL (ref 0.0–0.5)
EOS%: 0.9 % (ref 0.0–7.0)
HEMATOCRIT: 33.3 % — AB (ref 38.4–49.9)
HGB: 11.3 g/dL — ABNORMAL LOW (ref 13.0–17.1)
LYMPH%: 3.6 % — ABNORMAL LOW (ref 14.0–49.0)
MCH: 33.9 pg — ABNORMAL HIGH (ref 27.2–33.4)
MCHC: 33.9 g/dL (ref 32.0–36.0)
MCV: 100 fL — AB (ref 79.3–98.0)
MONO#: 1.1 10*3/uL — AB (ref 0.1–0.9)
MONO%: 9.9 % (ref 0.0–14.0)
NEUT%: 85.3 % — AB (ref 39.0–75.0)
NEUTROS ABS: 9.8 10*3/uL — AB (ref 1.5–6.5)
PLATELETS: 90 10*3/uL — AB (ref 140–400)
RBC: 3.33 10*6/uL — AB (ref 4.20–5.82)
RDW: 18.4 % — ABNORMAL HIGH (ref 11.0–14.6)
WBC: 11.5 10*3/uL — AB (ref 4.0–10.3)
lymph#: 0.4 10*3/uL — ABNORMAL LOW (ref 0.9–3.3)

## 2014-04-04 LAB — COMPREHENSIVE METABOLIC PANEL (CC13)
ALK PHOS: 104 U/L (ref 40–150)
ALT: 17 U/L (ref 0–55)
ANION GAP: 7 meq/L (ref 3–11)
AST: 20 U/L (ref 5–34)
Albumin: 3.7 g/dL (ref 3.5–5.0)
BUN: 14.4 mg/dL (ref 7.0–26.0)
CO2: 30 meq/L — AB (ref 22–29)
CREATININE: 0.8 mg/dL (ref 0.7–1.3)
Calcium: 9.3 mg/dL (ref 8.4–10.4)
Chloride: 103 mEq/L (ref 98–109)
Glucose: 124 mg/dl (ref 70–140)
Potassium: 4.3 mEq/L (ref 3.5–5.1)
SODIUM: 140 meq/L (ref 136–145)
TOTAL PROTEIN: 6.3 g/dL — AB (ref 6.4–8.3)
Total Bilirubin: 0.42 mg/dL (ref 0.20–1.20)

## 2014-04-04 LAB — MAGNESIUM (CC13): Magnesium: 2.3 mg/dl (ref 1.5–2.5)

## 2014-04-04 MED ORDER — CEPHALEXIN 500 MG PO CAPS: 500.0000 mg | ORAL_CAPSULE | Freq: Four times a day (QID) | ORAL | Status: DC

## 2014-04-04 NOTE — Telephone Encounter (Signed)
Reports new development of hardened area long vein in his left upper forearm just above a stick from recent chemo treatment. Reports it is tender to touch. Does not look red, but is firm. Recent treatment with Keflex qid for a prior vein irritation. Has had #3 cycles of chemo, and may have another #3 to go. Here for labs today and will be seen by Drue Second, NP afterwards.

## 2014-04-05 NOTE — Assessment & Plan Note (Addendum)
Patient completed cycle 3 of his cisplatin/etoposide chemotherapy regimen on 03/21/2014.  He is scheduled to receive cycle 4 of the same regimen on 04/11/2014.

## 2014-04-05 NOTE — Progress Notes (Signed)
SYMPTOM MANAGEMENT CLINIC   HPI: Patrick Carr 80 y.o. male diagnosed with lung cancer.  Currently undergoing cisplatin/etoposide chemotherapy therapy regimen.  Patient received his last cycle of chemotherapy on 03/21/2014.  His chemotherapy was administered peripherally to his left forearm.  Patient is complaining of erythema, edema, mild warmth, and mild tenderness to area directly above most recent IV insertion site.  Patient had previous diagnosis recently of phlebitis to the right forearm following chemotherapy administration a few weeks ago as well.  Patient states that his previous site of phlebitis has essentially resolved; but  Does continue to have some knotted cords to site.  Patient denies any chest pain, chest pressure, shortness of breath, or pain with inspiration.  He also denies any fevers or chills.  HPI  CURRENT THERAPY: Upcoming Treatment Dates - LUNG SMALL CELL - LIMITED STAGE Cisplatin D1 / Etoposide D1-3 q21d Days with orders from any treatment category:  04/11/2014      SCHEDULING COMMUNICATION      palonosetron (ALOXI) injection 0.25 mg      Dexamethasone Sodium Phosphate (DECADRON) injection 12 mg      fosaprepitant (EMEND) 150 mg in sodium chloride 0.9 % 145 mL IVPB      CISplatin (PLATINOL) 94 mg in sodium chloride 0.9 % 250 mL chemo infusion      etoposide (VEPESID) 190 mg in sodium chloride 0.9 % 500 mL chemo infusion      sodium chloride 0.9 % injection 10 mL      heparin lock flush 100 unit/mL      heparin lock flush 100 unit/mL      alteplase (CATHFLO ACTIVASE) injection 2 mg      sodium chloride 0.9 % injection 3 mL      Hot Pack 1 packet      0.9 %  sodium chloride infusion      dextrose 5 % and 0.45% NaCl 1,000 mL with potassium chloride 20 mEq, magnesium sulfate 12 mEq, mannitol 12.5 g infusion      TREATMENT CONDITIONS 04/12/2014      SCHEDULING COMMUNICATION      dexamethasone (DECADRON) injection 10 mg      etoposide (VEPESID) 190 mg in  sodium chloride 0.9 % 500 mL chemo infusion      sodium chloride 0.9 % injection 10 mL      heparin lock flush 100 unit/mL      heparin lock flush 100 unit/mL      alteplase (CATHFLO ACTIVASE) injection 2 mg      sodium chloride 0.9 % injection 3 mL      Hot Pack 1 packet      0.9 %  sodium chloride infusion 04/13/2014      SCHEDULING COMMUNICATION      dexamethasone (DECADRON) injection 10 mg      etoposide (VEPESID) 190 mg in sodium chloride 0.9 % 500 mL chemo infusion      sodium chloride 0.9 % injection 10 mL      heparin lock flush 100 unit/mL      heparin lock flush 100 unit/mL      alteplase (CATHFLO ACTIVASE) injection 2 mg      sodium chloride 0.9 % injection 3 mL      Hot Pack 1 packet      0.9 %  sodium chloride infusion    ROS  Past Medical History  Diagnosis Date  . Hypertension   . Hyperlipidemia   . BPH (benign prostatic  hyperplasia)   . Lesion of right lung     RLL  . RBBB     no palpations  . Renal cysts, acquired, bilateral   . History of kidney stones   . Hernia, inguinal left   . History of blood transfusion     artery in sinus bleed    Past Surgical History  Procedure Laterality Date  . Sinus artery surgery      not surgery  . No past surgeries    . Video bronchoscopy with endobronchial navigation N/A 01/18/2014    Procedure: VIDEO BRONCHOSCOPY WITH ENDOBRONCHIAL NAVIGATION;  Surgeon: [name], MD;  Location: [facility] OR;  Service: Thoracic;  Laterality: N/A;  . Video bronchoscopy with endobronchial ultrasound N/A 01/18/2014    Procedure: VIDEO BRONCHOSCOPY WITH ENDOBRONCHIAL ULTRASOUND;  Surgeon: [name], MD;  Location: [facility];  Service: Thoracic;  Laterality: N/A;    has Lung nodule; Hypertension; Hyperlipidemia; BPH (benign prostatic hyperplasia); RBBB; Renal cysts, acquired, bilateral; Lesion of right lung; Small cell carcinoma of right lung; and Phlebitis on his problem list.     is allergic to other.    Medication List        This list is accurate as of: 04/04/14 11:59 PM.  Always use your most recent med list.               alfuzosin 10 MG 24 hr tablet  Commonly known as:  UROXATRAL  Take 10 mg by mouth daily.     ALPRAZolam 0.25 MG tablet  Commonly known as:  XANAX  Take 0.25 mg by mouth 3 (three) times daily as needed for anxiety.     amLODipine 5 MG tablet  Commonly known as:  NORVASC  Take 5 mg by mouth daily.     aspirin 81 MG tablet  Take 81 mg by mouth daily.     atorvastatin 40 MG tablet  Commonly known as:  LIPITOR  Take 40 mg by mouth daily.     cephALEXin 500 MG capsule  Commonly known as:  KEFLEX  Take 1 capsule (500 mg total) by mouth 4 (four) times daily.     losartan 50 MG tablet  Commonly known as:  COZAAR  Take 50 mg by mouth daily.     prochlorperazine 10 MG tablet  Commonly known as:  COMPAZINE  Take 1 tablet (10 mg total) by mouth every 6 (six) hours as needed for nausea or vomiting.     tamsulosin 0.4 MG Caps capsule  Commonly known as:  FLOMAX  Take 0.4 mg by mouth daily.         PHYSICAL EXAMINATION  Blood pressure 121/62, pulse 108, temperature 97.8 F (36.6 C), temperature source Oral, resp. rate 18, height 5\' 5"  (1.651 m), weight 121 lb 1.6 oz (54.931 kg).  Physical Exam  LABORATORY DATA:. Appointment on 04/04/2014  Component Date Value Ref Range Status  . WBC 04/04/2014 11.5* 4.0 - 10.3 10e3/uL Final  . NEUT# 04/04/2014 9.8* 1.5 - 6.5 10e3/uL Final  . HGB 04/04/2014 11.3* 13.0 - 17.1 g/dL Final  . HCT 04/04/2014 33.3* 38.4 - 49.9 % Final  . Platelets 04/04/2014 90* 140 - 400 10e3/uL Final  . MCV 04/04/2014 100.0* 79.3 - 98.0 fL Final  . MCH 04/04/2014 33.9* 27.2 - 33.4 pg Final  . MCHC 04/04/2014 33.9  32.0 - 36.0 g/dL Final  . RBC 04/04/2014 3.33* 4.20 - 5.82 10e6/uL Final  . RDW 04/04/2014 18.4* 11.0 - 14.6 % Final  .  lymph# 04/04/2014 0.4* 0.9 - 3.3 10e3/uL Final  . MONO# 04/04/2014 1.1* 0.1 - 0.9 10e3/uL Final  . Eosinophils Absolute  04/04/2014 0.1  0.0 - 0.5 10e3/uL Final  . Basophils Absolute 04/04/2014 0.0  0.0 - 0.1 10e3/uL Final  . NEUT% 04/04/2014 85.3* 39.0 - 75.0 % Final  . LYMPH% 04/04/2014 3.6* 14.0 - 49.0 % Final  . MONO% 04/04/2014 9.9  0.0 - 14.0 % Final  . EOS% 04/04/2014 0.9  0.0 - 7.0 % Final  . BASO% 04/04/2014 0.3  0.0 - 2.0 % Final  . Sodium 04/04/2014 140  136 - 145 mEq/L Final  . Potassium 04/04/2014 4.3  3.5 - 5.1 mEq/L Final  . Chloride 04/04/2014 103  98 - 109 mEq/L Final  . CO2 04/04/2014 30* 22 - 29 mEq/L Final  . Glucose 04/04/2014 124  70 - 140 mg/dl Final  . BUN 04/04/2014 14.4  7.0 - 26.0 mg/dL Final  . Creatinine 04/04/2014 0.8  0.7 - 1.3 mg/dL Final  . Total Bilirubin 04/04/2014 0.42  0.20 - 1.20 mg/dL Final  . Alkaline Phosphatase 04/04/2014 104  40 - 150 U/L Final  . AST 04/04/2014 20  5 - 34 U/L Final  . ALT 04/04/2014 17  0 - 55 U/L Final  . Total Protein 04/04/2014 6.3* 6.4 - 8.3 g/dL Final  . Albumin 04/04/2014 3.7  3.5 - 5.0 g/dL Final  . Calcium 04/04/2014 9.3  8.4 - 10.4 mg/dL Final  . Anion Gap 04/04/2014 7  3 - 11 mEq/L Final  . Magnesium 04/04/2014 2.3  1.5 - 2.5 mg/dl Final     RADIOGRAPHIC STUDIES: No results found.  ASSESSMENT/PLAN:    Lung cancer: Patient completed cycle 3 of his cisplatin/etoposide chemotherapy regimen on 03/21/2014.  He is scheduled to receive cycle 4 of the same regimen on 04/11/2014.  Phlebitis:  Patient has area of erythema, edema, and mild warmth to the area just below his left elbow.  This area is directly above his most recent IV stick for chemotherapy. This area is most likely phlebitis secondary to recent IV stick/chemotherapy; but will also cover with Keflex antibiotic as well.  Patient was advised to continue with cool/moist compresses to the site and occasional ibuprofen for inflammation.  Patient was diagnosed with phlebitis to the right arm a few weeks ago post IV sticks/chemotherapy.  This area has no further erythema, warmth, or  edema.  He does however-continue with some knotted  cords at the previous phlebitis site.  All pulses are palpable in all extremities are warm.  Since patient still has several chemotherapy treatments ahead of him-will order port placement for better access.  Patient stated understanding of all instructions; and was in agreement with this plan of care. The patient knows to call the clinic with any problems, questions or concerns.   Review/collaboration with Dr. Julien Nordmann regarding all aspects of patient's visit today.   Total time spent with patient was 25 minutes;  with greater than 75 percent of that time spent in face to face counseling regarding his symptoms, and coordination of care and follow up.  Disclaimer: This note was dictated with voice recognition software. Similar sounding words can inadvertently be transcribed and may not be corrected upon review.   Drue Second, NP 04/06/2014

## 2014-04-06 ENCOUNTER — Telehealth: Payer: Self-pay | Admitting: Nurse Practitioner

## 2014-04-06 ENCOUNTER — Encounter: Payer: Self-pay | Admitting: Nurse Practitioner

## 2014-04-06 ENCOUNTER — Other Ambulatory Visit: Payer: Self-pay | Admitting: *Deleted

## 2014-04-06 ENCOUNTER — Telehealth: Payer: Self-pay | Admitting: *Deleted

## 2014-04-06 DIAGNOSIS — C3491 Malignant neoplasm of unspecified part of right bronchus or lung: Secondary | ICD-10-CM

## 2014-04-06 NOTE — Telephone Encounter (Signed)
Called patient to let him know that Dr. Julien Nordmann would like him to have a PAC placed.  The schedulers should be calling him and we have requested to have it placed before chemo on 04/11/14. He will call us if he has not heard from them by 1st thing on Friday am. Patient states his arm is still sore, red and swollen, but not as red or sore as yesterday.  He is taking his antibiotic. Let him know to call us if his arm does not continue to improve.

## 2014-04-06 NOTE — Assessment & Plan Note (Signed)
Patient has area of erythema, edema, and mild warmth to the area just below his left elbow.  This area is directly above his most recent IV stick for chemotherapy. This area is most likely phlebitis secondary to recent IV stick/chemotherapy; but will also cover with Keflex antibiotic as well.  Patient was advised to continue with cool/moist compresses to the site and occasional ibuprofen for inflammation.  Patient was diagnosed with phlebitis to the right arm a few weeks ago post IV sticks/chemotherapy.  This area has no further erythema, warmth, or edema.  He does however-continue with some knotted  cords at the previous phlebitis site.  All pulses are palpable in all extremities are warm.  Since patient still has several chemotherapy treatments ahead of him-will order port placement for better access.

## 2014-04-07 ENCOUNTER — Other Ambulatory Visit: Payer: Self-pay | Admitting: Radiology

## 2014-04-10 ENCOUNTER — Encounter (HOSPITAL_COMMUNITY): Payer: Self-pay

## 2014-04-10 ENCOUNTER — Ambulatory Visit (HOSPITAL_COMMUNITY)
Admission: RE | Admit: 2014-04-10 | Discharge: 2014-04-10 | Disposition: A | Discharge status: Home / Self Care | Payer: Medicare Other | Source: Ambulatory Visit | Attending: Nurse Practitioner | Admitting: Nurse Practitioner

## 2014-04-10 ENCOUNTER — Other Ambulatory Visit: Payer: Self-pay | Admitting: Nurse Practitioner

## 2014-04-10 DIAGNOSIS — C3490 Malignant neoplasm of unspecified part of unspecified bronchus or lung: Secondary | ICD-10-CM | POA: Insufficient documentation

## 2014-04-10 DIAGNOSIS — C3491 Malignant neoplasm of unspecified part of right bronchus or lung: Secondary | ICD-10-CM

## 2014-04-10 HISTORY — DX: Phlebitis and thrombophlebitis of unspecified site: I80.9

## 2014-04-10 LAB — CBC WITH DIFFERENTIAL/PLATELET
Basophils Absolute: 0 10*3/uL (ref 0.0–0.1)
Basophils Relative: 0 % (ref 0–1)
Eosinophils Absolute: 0.2 10*3/uL (ref 0.0–0.7)
Eosinophils Relative: 1 % (ref 0–5)
HEMATOCRIT: 35.1 % — AB (ref 39.0–52.0)
Hemoglobin: 11.8 g/dL — ABNORMAL LOW (ref 13.0–17.0)
Lymphocytes Relative: 7 % — ABNORMAL LOW (ref 12–46)
Lymphs Abs: 1.1 10*3/uL (ref 0.7–4.0)
MCH: 33.9 pg (ref 26.0–34.0)
MCHC: 33.6 g/dL (ref 30.0–36.0)
MCV: 100.9 fL — ABNORMAL HIGH (ref 78.0–100.0)
MONOS PCT: 3 % (ref 3–12)
Monocytes Absolute: 0.5 10*3/uL (ref 0.1–1.0)
NEUTROS ABS: 14.5 10*3/uL — AB (ref 1.7–7.7)
NEUTROS PCT: 89 % — AB (ref 43–77)
Platelets: 164 10*3/uL (ref 150–400)
RBC: 3.48 MIL/uL — AB (ref 4.22–5.81)
RDW: 19.3 % — ABNORMAL HIGH (ref 11.5–15.5)
WBC: 16.3 10*3/uL — AB (ref 4.0–10.5)

## 2014-04-10 LAB — PROTIME-INR
INR: 0.98 (ref 0.00–1.49)
PROTHROMBIN TIME: 13.1 s (ref 11.6–15.2)

## 2014-04-10 LAB — APTT: aPTT: 27 seconds (ref 24–37)

## 2014-04-10 MED ORDER — SODIUM CHLORIDE 0.9 % IV SOLN
INTRAVENOUS | Status: DC
  Administered 2014-04-10: 13:00:00 via INTRAVENOUS

## 2014-04-10 MED ORDER — LIDOCAINE HCL 1 % IJ SOLN
INTRAMUSCULAR | Status: AC
  Filled 2014-04-10: qty 20

## 2014-04-10 MED ORDER — FENTANYL CITRATE 0.05 MG/ML IJ SOLN
INTRAMUSCULAR | Status: AC
  Filled 2014-04-10: qty 6

## 2014-04-10 MED ORDER — HEPARIN SOD (PORK) LOCK FLUSH 100 UNIT/ML IV SOLN
INTRAVENOUS | Status: AC | PRN
  Administered 2014-04-10: 500 [IU]

## 2014-04-10 MED ORDER — MIDAZOLAM HCL 2 MG/2ML IJ SOLN
INTRAMUSCULAR | Status: AC
  Filled 2014-04-10: qty 6

## 2014-04-10 MED ORDER — CEFAZOLIN SODIUM-DEXTROSE 2-3 GM-% IV SOLR
2.0000 g | INTRAVENOUS | Status: AC
  Administered 2014-04-10: 2 g via INTRAVENOUS

## 2014-04-10 MED ORDER — FENTANYL CITRATE 0.05 MG/ML IJ SOLN
INTRAMUSCULAR | Status: AC | PRN
  Administered 2014-04-10: 50 ug via INTRAVENOUS
  Administered 2014-04-10: 25 ug via INTRAVENOUS

## 2014-04-10 MED ORDER — MIDAZOLAM HCL 2 MG/2ML IJ SOLN
INTRAMUSCULAR | Status: AC | PRN
  Administered 2014-04-10 (×2): 1 mg via INTRAVENOUS
  Administered 2014-04-10: 0.5 mg via INTRAVENOUS

## 2014-04-10 MED ORDER — CEFAZOLIN SODIUM-DEXTROSE 2-3 GM-% IV SOLR
INTRAVENOUS | Status: AC
  Administered 2014-04-10: 2 g via INTRAVENOUS
  Filled 2014-04-10: qty 50

## 2014-04-10 MED ORDER — HEPARIN SOD (PORK) LOCK FLUSH 100 UNIT/ML IV SOLN
INTRAVENOUS | Status: AC
  Filled 2014-04-10: qty 5

## 2014-04-10 NOTE — Discharge Instructions (Signed)
Conscious Sedation Sedation is the use of medicines to promote relaxation and relieve discomfort and anxiety. Conscious sedation is a type of sedation. Under conscious sedation you are less alert than normal but are still able to respond to instructions or stimulation. Conscious sedation is used during short medical and dental procedures. It is milder than deep sedation or general anesthesia and allows you to return to your regular activities sooner.  LET [facility] CARE PROVIDER KNOW ABOUT:   Any allergies you have.  All medicines you are taking, including vitamins, herbs, eye drops, creams, and over-the-counter medicines.  Use of steroids (by mouth or creams).  Previous problems you or members of your family have had with the use of anesthetics.  Any blood disorders you have.  Previous surgeries you have had.  Medical conditions you have.  Possibility of pregnancy, if this applies.  Use of cigarettes, alcohol, or illegal drugs. RISKS AND COMPLICATIONS Generally, this is a safe procedure. However, as with any procedure, problems can occur. Possible problems include:  Oversedation.  Trouble breathing on your own. You may need to have a breathing tube until you are awake and breathing on your own.  Allergic reaction to any of the medicines used for the procedure. BEFORE THE PROCEDURE  You may have blood tests done. These tests can help show how well your kidneys and liver are working. They can also show how well your blood clots.  A physical exam will be done.  Only take medicines as directed by your health care provider. You may need to stop taking medicines (such as blood thinners, aspirin, or nonsteroidal anti-inflammatory drugs) before the procedure.   Do not eat or drink at least 6 hours before the procedure or as directed by your health care provider.  Arrange for a responsible adult, family member, or friend to take you home after the procedure. He or she should stay  with you for at least 24 hours after the procedure, until the medicine has worn off. PROCEDURE   An intravenous (IV) catheter will be inserted into one of your veins. Medicine will be able to flow directly into your body through this catheter. You may be given medicine through this tube to help prevent pain and help you relax.  The medical or dental procedure will be done. AFTER THE PROCEDURE  You will stay in a recovery area until the medicine has worn off. Your blood pressure and pulse will be checked.   Depending on the procedure you had, you may be allowed to go home when you can tolerate liquids and your pain is under control. Document Released: 02/04/2001 Document Revised: 05/17/2013 Document Reviewed: 01/17/2013 [facility] Patient Information 2015 [place], [place]. This information is not intended to replace advice given to you by your health care provider. Make sure you discuss any questions you have with your health care provider. Implanted Port Insertion, Care After Refer to this sheet in the next few weeks. These instructions provide you with information on caring for yourself after your procedure. Your health care provider may also give you more specific instructions. Your treatment has been planned according to current medical practices, but problems sometimes occur. Call your health care provider if you have any problems or questions after your procedure. WHAT TO EXPECT AFTER THE PROCEDURE After your procedure, it is typical to have the following:   Discomfort at the port insertion site. Ice packs to the area will help.  Bruising on the skin over the port. This will subside  in 3-4 days. HOME CARE INSTRUCTIONS  After your port is placed, you will get a manufacturer's information card. The card has information about your port. Keep this card with you at all times.   Know what kind of port you have. There are many types of ports available.   Wear a medical alert bracelet in  case of an emergency. This can help alert health care workers that you have a port.   The port can stay in for as long as your health care provider believes it is necessary.   A home health care nurse may give medicines and take care of the port.   You or a family member can get special training and directions for giving medicine and taking care of the port at home.  SEEK MEDICAL CARE IF:   Your port does not flush or you are unable to get a blood return.   You have a fever or chills. SEEK IMMEDIATE MEDICAL CARE IF:  You have new fluid or pus coming from your incision.   You notice a bad smell coming from your incision site.   You have swelling, pain, or more redness at the incision or port site.   You have chest pain or shortness of breath. Document Released: 03/02/2013 Document Revised: 05/17/2013 Document Reviewed: 03/02/2013 [facility] Patient Information 2015 [place], [place]. This information is not intended to replace advice given to you by your health care provider. Make sure you discuss any questions you have with your health care provider. Implanted [facility] Guide An implanted port is a type of central line that is placed under the skin. Central lines are used to provide IV access when treatment or nutrition needs to be given through a person's veins. Implanted ports are used for long-term IV access. An implanted port may be placed because:   You need IV medicine that would be irritating to the small veins in your hands or arms.   You need long-term IV medicines, such as antibiotics.   You need IV nutrition for a long period.   You need frequent blood draws for lab tests.   You need dialysis.  Implanted ports are usually placed in the chest area, but they can also be placed in the upper arm, the abdomen, or the leg. An implanted port has two main parts:   Reservoir. The reservoir is round and will appear as a small, raised area under your skin. The reservoir  is the part where a needle is inserted to give medicines or draw blood.   Catheter. The catheter is a thin, flexible tube that extends from the reservoir. The catheter is placed into a large vein. Medicine that is inserted into the reservoir goes into the catheter and then into the vein.  HOW WILL I CARE FOR MY INCISION SITE? Do not get the incision site wet. Bathe or shower as directed by your health care provider.  HOW IS MY PORT ACCESSED? Special steps must be taken to access the port:   Before the port is accessed, a numbing cream can be placed on the skin. This helps numb the skin over the port site.   Your health care provider uses a sterile technique to access the port.  Your health care provider must put on a mask and sterile gloves.  The skin over your port is cleaned carefully with an antiseptic and allowed to dry.  The port is gently pinched between sterile gloves, and a needle is inserted into the port.  Only "non-coring" port needles should be used to access the port. Once the port is accessed, a blood return should be checked. This helps ensure that the port is in the vein and is not clogged.   If your port needs to remain accessed for a constant infusion, a clear (transparent) bandage will be placed over the needle site. The bandage and needle will need to be changed every week, or as directed by your health care provider.   Keep the bandage covering the needle clean and dry. Do not get it wet. Follow your health care provider's instructions on how to take a shower or bath while the port is accessed.   If your port does not need to stay accessed, no bandage is needed over the port.  WHAT IS FLUSHING? Flushing helps keep the port from getting clogged. Follow your health care provider's instructions on how and when to flush the port. Ports are usually flushed with saline solution or a medicine called heparin. The need for flushing will depend on how the port is used.    If the port is used for intermittent medicines or blood draws, the port will need to be flushed:   After medicines have been given.   After blood has been drawn.   As part of routine maintenance.   If a constant infusion is running, the port may not need to be flushed.  HOW LONG WILL MY PORT STAY IMPLANTED? The port can stay in for as long as your health care provider thinks it is needed. When it is time for the port to come out, surgery will be done to remove it. The procedure is similar to the one performed when the port was put in.  WHEN SHOULD I SEEK IMMEDIATE MEDICAL CARE? When you have an implanted port, you should seek immediate medical care if:   You notice a bad smell coming from the incision site.   You have swelling, redness, or drainage at the incision site.   You have more swelling or pain at the port site or the surrounding area.   You have a fever that is not controlled with medicine. Document Released: 05/12/2005 Document Revised: 03/02/2013 Document Reviewed: 01/17/2013 [facility] Patient Information 2015 [place], [place]. This information is not intended to replace advice given to you by your health care provider. Make sure you discuss any questions you have with your health care provider.

## 2014-04-10 NOTE — Procedures (Signed)
RIJV PAC Tip SVC RA No comp

## 2014-04-10 NOTE — H&P (Signed)
Chief Complaint: "I am here for a port."  Referring Physician(s): Bacon,Cynthia  History of Present Illness: Patrick Carr is a 80 y.o. male with lung cancer scheduled today for a port a catheter for chemotherapy. He denies any chest pain, shortness of breath or palpitations. He denies any active signs of bleeding or excessive bruising. He denies any recent fever or chills. The patient denies any history of sleep apnea or chronic oxygen use. He has previously tolerated sedation without complications.   Past Medical History  Diagnosis Date  . Hypertension   . Hyperlipidemia   . BPH (benign prostatic hyperplasia)   . Lesion of right lung     RLL  . RBBB     no palpations  . Renal cysts, acquired, bilateral   . History of kidney stones   . Hernia, inguinal left   . History of blood transfusion     artery in sinus bleed  . Phlebitis 03/20/14    bilateral forearms    Past Surgical History  Procedure Laterality Date  . Sinus artery surgery      not surgery  . No past surgeries    . Video bronchoscopy with endobronchial navigation N/A 01/18/2014    Procedure: VIDEO BRONCHOSCOPY WITH ENDOBRONCHIAL NAVIGATION;  Surgeon: [name], MD;  Location: [facility] OR;  Service: Thoracic;  Laterality: N/A;  . Video bronchoscopy with endobronchial ultrasound N/A 01/18/2014    Procedure: VIDEO BRONCHOSCOPY WITH ENDOBRONCHIAL ULTRASOUND;  Surgeon: [name], MD;  Location: [place];  Service: Thoracic;  Laterality: N/A;    Allergies: Other and Pneumococcal vaccines  Medications: Prior to Admission medications   Medication Sig Start Date End Date Taking? Authorizing Provider  alfuzosin (UROXATRAL) 10 MG 24 hr tablet Take 10 mg by mouth daily.   Yes Historical Provider, MD  ALPRAZolam (XANAX) 0.25 MG tablet Take 0.25 mg by mouth 3 (three) times daily as needed for anxiety.   Yes Historical Provider, MD  amLODipine (NORVASC) 5 MG tablet Take 5 mg by mouth daily.   Yes Historical  Provider, MD  aspirin 81 MG tablet Take 81 mg by mouth daily.   Yes Historical Provider, MD  atorvastatin (LIPITOR) 40 MG tablet Take 40 mg by mouth daily.   Yes Historical Provider, MD  cephALEXin (KEFLEX) 500 MG capsule Take 1 capsule (500 mg total) by mouth 4 (four) times daily. 04/04/14  Yes Drue Second, NP  losartan (COZAAR) 50 MG tablet Take 50 mg by mouth daily.   Yes Historical Provider, MD  prochlorperazine (COMPAZINE) 10 MG tablet Take 1 tablet (10 mg total) by mouth every 6 (six) hours as needed for nausea or vomiting. 01/28/14  Yes [name], MD  tamsulosin (FLOMAX) 0.4 MG CAPS capsule Take 0.4 mg by mouth daily. 01/23/14  Yes Historical Provider, MD    Family History  Problem Relation Age of Onset  . Heart disease Mother   . Cancer Father     History   Social History  . Marital Status: Married    Spouse Name: N/A    Number of Children: N/A  . Years of Education: N/A   Social History Main Topics  . Smoking status: Former Smoker -- 0.50 packs/day for 2 years    Quit date: 01/29/2014  . Smokeless tobacco: Never Used  . Alcohol Use: No  . Drug Use: No  . Sexual Activity: None   Other Topics Concern  . None   Social History Narrative    Review of  Systems: A 12 point ROS discussed and pertinent positives are indicated in the HPI above.  All other systems are negative.  Review of Systems  Vital Signs: BP 163/81 mmHg  Pulse 115  Temp(Src) 97.3 F (36.3 C) (Oral)  Resp 18  SpO2 97%  Physical Exam  Constitutional: He is oriented to person, place, and time. No distress.  HENT:  Head: Normocephalic and atraumatic.  Neck: No tracheal deviation present.  Cardiovascular: Exam reveals no gallop and no friction rub.   No murmur heard. Tachycardic  Pulmonary/Chest: Effort normal. No respiratory distress. He has no wheezes. He has no rales.  BS diminished throughout   Abdominal: Soft. Bowel sounds are normal. He exhibits no distension. There is no  tenderness.  Neurological: He is alert and oriented to person, place, and time.  Skin: He is not diaphoretic.  Psychiatric: He has a normal mood and affect. His behavior is normal. Thought content normal.   Imaging: Ct Chest W Contrast  03/17/2014   CLINICAL DATA:  Restaging evaluation. Status post chemotherapy with radiation therapy in progress.  EXAM: CT CHEST WITH CONTRAST  TECHNIQUE: Multidetector CT imaging of the chest was performed during intravenous contrast administration.  CONTRAST:  64mL OMNIPAQUE IOHEXOL 300 MG/ML  SOLN  COMPARISON:  Chest CT 01/17/2014  FINDINGS: Visualized thyroid is unremarkable. No enlarged axillary, mediastinal or hilar lymphadenopathy. Normal heart size. No pericardial effusion. Normal caliber aorta and main pulmonary artery.  Central airways are patent. Diffuse emphysematous change. Slight interval decrease in size of right lower lobe spiculated nodule measuring 11 x 6 mm, previously 12 x 7 mm. Unchanged right apical pleural parenchymal scarring. No pleural effusion pneumothorax.  Visualization of the upper abdomen demonstrates a stable subcentimeter low-attenuation lesion within the left hepatic lobe, too small to accurately characterize. No aggressive or acute appearing osseous lesions. Degenerative changes of the visualized thoracic spine.  IMPRESSION: Slight interval decrease in size of 11 mm right lower lobe spiculated nodule. Interval decrease in previously described right hilar adenopathy.   Electronically Signed   By: Lovey Newcomer M.D.   On: 03/17/2014 17:30    Labs:  CBC:  Recent Labs  03/21/14 0913 03/28/14 1237 04/04/14 1212 04/10/14 1310  WBC 14.2* 9.0 11.5* 16.3*  HGB 12.5* 12.1* 11.3* 11.8*  HCT 37.7* 35.6* 33.3* 35.1*  PLT 185 153 90* 164    COAGS:  Recent Labs  01/18/14 0640 04/10/14 1310  INR 1.06 0.98  APTT 28 27    BMP:  Recent Labs  01/18/14 0640  03/14/14 1315 03/21/14 0914 03/28/14 1237 04/04/14 1212  NA 138  < >  142 143 144 140  K 4.5  < > 4.3 4.2 4.3 4.3  CL 99  --   --   --   --   --   CO2 26  < > 29 28 30* 30*  GLUCOSE 112*  < > 116 122 99 124  BUN 19  < > 15.7 20.2 33.9* 14.4  CALCIUM 9.0  < > 9.8 9.6 9.4 9.3  CREATININE 0.63  < > 1.0 0.7 0.8 0.8  GFRNONAA >90  --   --   --   --   --   GFRAA >90  --   --   --   --   --   < > = values in this interval not displayed.  LIVER FUNCTION TESTS:  Recent Labs  03/14/14 1315 03/21/14 0914 03/28/14 1237 04/04/14 1212  BILITOT 0.39 0.31 0.73 0.42  AST 21 21 18 20   ALT 19 20 18 17   ALKPHOS 131 104 118 104  PROT 6.7 6.8 6.4 6.3*  ALBUMIN 3.7 3.7 3.6 3.7   Assessment and Plan: Lung cancer Phlebitis from recent chemotherapy on keflex Leukocytosis, recent neulasta  Scheduled today for image guided port a catheter placement with moderate sedation Patient has been NPO, no blood thinners taken, labs reviewed, afebrile Risks and Benefits discussed with the patient. All of the patient's questions were answered, patient is agreeable to proceed. Consent signed and in chart.     SignedHedy Jacob 04/10/2014, 2:36 PM

## 2014-04-11 ENCOUNTER — Ambulatory Visit: Payer: Medicare Other | Admitting: Nutrition

## 2014-04-11 ENCOUNTER — Telehealth: Payer: Self-pay | Admitting: Internal Medicine

## 2014-04-11 ENCOUNTER — Telehealth: Payer: Self-pay | Admitting: Medical Oncology

## 2014-04-11 ENCOUNTER — Other Ambulatory Visit: Payer: Self-pay | Admitting: Medical Oncology

## 2014-04-11 ENCOUNTER — Other Ambulatory Visit (HOSPITAL_COMMUNITY): Payer: Self-pay | Admitting: Interventional Radiology

## 2014-04-11 ENCOUNTER — Encounter: Payer: Self-pay | Admitting: Internal Medicine

## 2014-04-11 ENCOUNTER — Ambulatory Visit (HOSPITAL_COMMUNITY)
Admission: RE | Admit: 2014-04-11 | Discharge: 2014-04-11 | Disposition: A | Discharge status: Home / Self Care | Payer: Medicare Other | Source: Ambulatory Visit | Attending: Interventional Radiology | Admitting: Interventional Radiology

## 2014-04-11 ENCOUNTER — Ambulatory Visit (HOSPITAL_BASED_OUTPATIENT_CLINIC_OR_DEPARTMENT_OTHER): Payer: Medicare Other

## 2014-04-11 ENCOUNTER — Other Ambulatory Visit (HOSPITAL_BASED_OUTPATIENT_CLINIC_OR_DEPARTMENT_OTHER): Payer: Medicare Other

## 2014-04-11 ENCOUNTER — Ambulatory Visit (HOSPITAL_BASED_OUTPATIENT_CLINIC_OR_DEPARTMENT_OTHER): Payer: Medicare Other | Admitting: Internal Medicine

## 2014-04-11 VITALS — BP 123/67 | HR 102 | Temp 97.4°F | Resp 18 | Ht 65.0 in | Wt 121.9 lb

## 2014-04-11 DIAGNOSIS — C3491 Malignant neoplasm of unspecified part of right bronchus or lung: Secondary | ICD-10-CM

## 2014-04-11 DIAGNOSIS — Z95828 Presence of other vascular implants and grafts: Secondary | ICD-10-CM

## 2014-04-11 DIAGNOSIS — C3431 Malignant neoplasm of lower lobe, right bronchus or lung: Secondary | ICD-10-CM

## 2014-04-11 DIAGNOSIS — Z5111 Encounter for antineoplastic chemotherapy: Secondary | ICD-10-CM

## 2014-04-11 DIAGNOSIS — C7A1 Malignant poorly differentiated neuroendocrine tumors: Secondary | ICD-10-CM

## 2014-04-11 LAB — CBC WITH DIFFERENTIAL/PLATELET
BASO%: 0.1 % (ref 0.0–2.0)
Basophils Absolute: 0 10*3/uL (ref 0.0–0.1)
EOS ABS: 0 10*3/uL (ref 0.0–0.5)
EOS%: 0.2 % (ref 0.0–7.0)
HCT: 33.9 % — ABNORMAL LOW (ref 38.4–49.9)
HGB: 11.3 g/dL — ABNORMAL LOW (ref 13.0–17.1)
LYMPH%: 3.7 % — AB (ref 14.0–49.0)
MCH: 33.9 pg — ABNORMAL HIGH (ref 27.2–33.4)
MCHC: 33.5 g/dL (ref 32.0–36.0)
MCV: 101.4 fL — AB (ref 79.3–98.0)
MONO#: 1.4 10*3/uL — AB (ref 0.1–0.9)
MONO%: 9.1 % (ref 0.0–14.0)
NEUT%: 86.9 % — ABNORMAL HIGH (ref 39.0–75.0)
NEUTROS ABS: 13.4 10*3/uL — AB (ref 1.5–6.5)
Platelets: 194 10*3/uL (ref 140–400)
RBC: 3.34 10*6/uL — AB (ref 4.20–5.82)
RDW: 20.3 % — AB (ref 11.0–14.6)
WBC: 15.4 10*3/uL — AB (ref 4.0–10.3)
lymph#: 0.6 10*3/uL — ABNORMAL LOW (ref 0.9–3.3)

## 2014-04-11 LAB — COMPREHENSIVE METABOLIC PANEL (CC13)
ALK PHOS: 93 U/L (ref 40–150)
ALT: 17 U/L (ref 0–55)
AST: 20 U/L (ref 5–34)
Albumin: 3.7 g/dL (ref 3.5–5.0)
Anion Gap: 8 mEq/L (ref 3–11)
BILIRUBIN TOTAL: 0.41 mg/dL (ref 0.20–1.20)
BUN: 16.7 mg/dL (ref 7.0–26.0)
CO2: 26 mEq/L (ref 22–29)
Calcium: 9.3 mg/dL (ref 8.4–10.4)
Chloride: 104 mEq/L (ref 98–109)
Creatinine: 0.8 mg/dL (ref 0.7–1.3)
GLUCOSE: 109 mg/dL (ref 70–140)
Potassium: 4.3 mEq/L (ref 3.5–5.1)
SODIUM: 139 meq/L (ref 136–145)
Total Protein: 6.5 g/dL (ref 6.4–8.3)

## 2014-04-11 LAB — MAGNESIUM (CC13): MAGNESIUM: 2.3 mg/dL (ref 1.5–2.5)

## 2014-04-11 MED ORDER — HEPARIN SOD (PORK) LOCK FLUSH 100 UNIT/ML IV SOLN
500.0000 [IU] | Freq: Once | INTRAVENOUS | Status: AC | PRN
  Administered 2014-04-11: 500 [IU]
  Filled 2014-04-11: qty 5

## 2014-04-11 MED ORDER — SODIUM CHLORIDE 0.9 % IV SOLN
120.0000 mg/m2 | Freq: Once | INTRAVENOUS | Status: AC
  Administered 2014-04-11: 190 mg via INTRAVENOUS
  Filled 2014-04-11: qty 9.5

## 2014-04-11 MED ORDER — LIDOCAINE-PRILOCAINE 2.5-2.5 % EX CREA
TOPICAL_CREAM | CUTANEOUS | Status: AC
  Filled 2014-04-11: qty 5

## 2014-04-11 MED ORDER — LIDOCAINE-PRILOCAINE 2.5-2.5 % EX CREA: 1.0000 "application " | TOPICAL_CREAM | CUTANEOUS | Status: DC | PRN

## 2014-04-11 MED ORDER — POTASSIUM CHLORIDE 2 MEQ/ML IV SOLN
Freq: Once | INTRAVENOUS | Status: AC
  Administered 2014-04-11: 12:00:00 via INTRAVENOUS
  Filled 2014-04-11: qty 10

## 2014-04-11 MED ORDER — SODIUM CHLORIDE 0.9 % IJ SOLN
10.0000 mL | INTRAMUSCULAR | Status: DC | PRN
  Administered 2014-04-11: 10 mL
  Filled 2014-04-11: qty 10

## 2014-04-11 MED ORDER — CISPLATIN CHEMO INJECTION 100MG/100ML
60.0000 mg/m2 | Freq: Once | INTRAVENOUS | Status: AC
  Administered 2014-04-11: 94 mg via INTRAVENOUS
  Filled 2014-04-11: qty 94

## 2014-04-11 MED ORDER — DEXAMETHASONE SODIUM PHOSPHATE 20 MG/5ML IJ SOLN
12.0000 mg | Freq: Once | INTRAMUSCULAR | Status: AC
  Administered 2014-04-11: 12 mg via INTRAVENOUS

## 2014-04-11 MED ORDER — SODIUM CHLORIDE 0.9 % IV SOLN
150.0000 mg | Freq: Once | INTRAVENOUS | Status: AC
  Administered 2014-04-11: 150 mg via INTRAVENOUS
  Filled 2014-04-11: qty 5

## 2014-04-11 MED ORDER — PALONOSETRON HCL INJECTION 0.25 MG/5ML
0.2500 mg | Freq: Once | INTRAVENOUS | Status: AC
  Administered 2014-04-11: 0.25 mg via INTRAVENOUS

## 2014-04-11 MED ORDER — SODIUM CHLORIDE 0.9 % IV SOLN
Freq: Once | INTRAVENOUS | Status: AC
  Administered 2014-04-11: 11:00:00 via INTRAVENOUS

## 2014-04-11 MED ORDER — DEXAMETHASONE SODIUM PHOSPHATE 20 MG/5ML IJ SOLN
INTRAMUSCULAR | Status: AC
  Filled 2014-04-11: qty 5

## 2014-04-11 MED ORDER — PALONOSETRON HCL INJECTION 0.25 MG/5ML
INTRAVENOUS | Status: AC
  Filled 2014-04-11: qty 5

## 2014-04-11 NOTE — Progress Notes (Signed)
Patient ID: Patrick Carr, male   DOB: 10-Sep-1933, 80 y.o.   MRN: [id number] Pt presented to IR dept today, s/p uncomplicated  rt IJ port a cath 11/16, for evaluation of soreness/redness at port site. Pt states he noticed some redness at port site when getting out of bed this am. Site was also mildly tender. He denies fevers, CP, sig dyspnea. On exam, port site is ecchymotic, mildy tender but not edematous. There is no visible drainage. Dressing is intact. IJ access site is mildly tender but not swollen. Pt is currently on keflex for phlebitis. He is scheduled for oncology f/u visit this morning at 9:45. Pt was told to contact our service if he develops fever , increased pain/edema at port site. He was also seen by Dr. Kathlene Cote. Would avoid accessing port today to allow for additional recovery time/healing at port site.

## 2014-04-11 NOTE — Patient Instructions (Signed)
[facility] Discharge Instructions for Patients Receiving Chemotherapy  Today you received the following chemotherapy agents: Etoposide and Cisplatin.  To help prevent nausea and vomiting after your treatment, we encourage you to take your nausea medication as prescribed.   If you develop nausea and vomiting that is not controlled by your nausea medication, call the clinic.   BELOW ARE SYMPTOMS THAT SHOULD BE REPORTED IMMEDIATELY:  *FEVER GREATER THAN 100.5 F  *CHILLS WITH OR WITHOUT FEVER  NAUSEA AND VOMITING THAT IS NOT CONTROLLED WITH YOUR NAUSEA MEDICATION  *UNUSUAL SHORTNESS OF BREATH  *UNUSUAL BRUISING OR BLEEDING  TENDERNESS IN MOUTH AND THROAT WITH OR WITHOUT PRESENCE OF ULCERS  *URINARY PROBLEMS  *BOWEL PROBLEMS  UNUSUAL RASH Items with * indicate a potential emergency and should be followed up as soon as possible.  Feel free to call the clinic you have any questions or concerns. The clinic phone number is [phone].

## 2014-04-11 NOTE — Telephone Encounter (Signed)
gv and printed appt sched and avs for pt for NOV adn DEC....sed added tx.

## 2014-04-11 NOTE — Telephone Encounter (Signed)
Pt stated he was told not to use port due to bruising-per [place]. Per Julien Nordmann it is okay to use port. Emla cream applied.

## 2014-04-11 NOTE — Progress Notes (Signed)
Nutrition follow up completed with patient during chemotherapy. Patient reports appetite is slowly improving.  He continues to force himself to eat some days. He is drinking oral nutrition supplements often. Weight stable and documented as 121.9 pounds November 17.   Nutrition diagnosis:unintended weight loss improved.  Intervention: Patient educated to continue increased calories and protein throughout the day. Patient should continue oral nutrition supplements 3 times a day. Questions were answered.  Teach back method used.  Monitoring, evaluation, goals: Patient will continue to work to increase oral intake to promote weight stabilization.  Next visit:Tuesday, December 8, during chemotherapy.  **Disclaimer: This note was dictated with voice recognition software. Similar sounding words can inadvertently be transcribed and this note may contain transcription errors which may not have been corrected upon publication of note.**

## 2014-04-11 NOTE — Progress Notes (Signed)
[facility] [phone]   [phone]  OFFICE PROGRESS NOTE  [name], MD [address] [place] [address]  DIAGNOSIS: Limited stage small cell lung cancer, stage IIA (T1a, N1, M0) presented with right lower lobe nodule in addition to right hilar lymphadenopathy diagnosed in August of 2015.  PRIOR THERAPY: None.  CURRENT THERAPY: Systemic chemotherapy with cisplatin 60 mg/M2 on day 1 and etoposide at 120 mg/M2 on days 1, 2 and 3 with Neulasta support on day 4. Status post 3 cycles.  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 The patient is tolerating his systemic chemotherapy with cisplatin and etoposide fairly well with no significant adverse effects. He denied having any fever or chills. He has no nausea or vomiting. He denied having any significant chest pain, shortness of breath, cough or hemoptysis. He has no significant weight loss or night sweats. He has phlebitis in the upper extremity secondary to his chemotherapy infusion and the patient was treated recently with a course of Keflex. He has a Port-A-Cath placed yesterday for IV access. He is here today to start cycle #4 of his chemotherapy.  MEDICAL HISTORY: Past Medical History  Diagnosis Date  . Hypertension   . Hyperlipidemia   . BPH (benign prostatic hyperplasia)   . Lesion of right lung     RLL  . RBBB     no palpations  . Renal cysts, acquired, bilateral   . History of kidney stones   . Hernia, inguinal left   . History of blood transfusion     artery in sinus bleed  . Phlebitis 03/20/14    bilateral forearms    ALLERGIES:  is allergic to other and pneumococcal vaccines.  MEDICATIONS:  Current Outpatient Prescriptions  Medication Sig Dispense Refill  . alfuzosin (UROXATRAL) 10 MG 24 hr tablet Take 10 mg by mouth daily.    [name] ALPRAZolam (XANAX) 0.25 MG tablet Take  0.25 mg by mouth 3 (three) times daily as needed for anxiety.    [name] amLODipine (NORVASC) 5 MG tablet Take 5 mg by mouth daily.    [name] aspirin 81 MG tablet Take 81 mg by mouth daily.    [name] atorvastatin (LIPITOR) 40 MG tablet Take 40 mg by mouth daily.    . cephALEXin (KEFLEX) 500 MG capsule Take 1 capsule (500 mg total) by mouth 4 (four) times daily. 40 capsule 0  . losartan (COZAAR) 100 MG tablet Take 100 mg by mouth daily.    . tamsulosin (FLOMAX) 0.4 MG CAPS capsule Take 0.4 mg by mouth daily.    [name] lidocaine-prilocaine (EMLA) cream Apply 1 application topically as needed. Apply over port site 1 hour prior to chemo. Do not rub in medicine. 30 g 0  . prochlorperazine (COMPAZINE) 10 MG tablet Take 1 tablet (10 mg total) by mouth every 6 (six) hours as needed for nausea or vomiting. 30 tablet 0   No current facility-administered medications for this visit.    SURGICAL HISTORY:  Past Surgical History  Procedure Laterality Date  . Sinus artery surgery      not surgery  . No past surgeries    . Video bronchoscopy with endobronchial navigation N/A 01/18/2014    Procedure: VIDEO BRONCHOSCOPY WITH ENDOBRONCHIAL NAVIGATION;  Surgeon: [name], MD;  Location: [facility] OR;  Service: Thoracic;  Laterality: N/A;  . Video bronchoscopy with endobronchial ultrasound N/A  01/18/2014    Procedure: VIDEO BRONCHOSCOPY WITH ENDOBRONCHIAL ULTRASOUND;  Surgeon: [name], MD;  Location: [facility] OR;  Service: Thoracic;  Laterality: N/A;    REVIEW OF SYSTEMS:  A comprehensive review of systems was negative.   PHYSICAL EXAMINATION: General appearance: alert, cooperative and no distress Head: Normocephalic, without obvious abnormality, atraumatic Neck: no adenopathy, no JVD, supple, symmetrical, trachea midline and thyroid not enlarged, symmetric, no tenderness/mass/nodules Lymph nodes: Cervical, supraclavicular, and axillary nodes normal. Resp: clear to auscultation bilaterally Back: symmetric, no curvature.  ROM normal. No CVA tenderness. Cardio: regular rate and rhythm, S1, S2 normal, no murmur, click, rub or gallop GI: soft, non-tender; bowel sounds normal; no masses,  no organomegaly Extremities: extremities normal, atraumatic, no cyanosis or edema  ECOG PERFORMANCE STATUS: 1 - Symptomatic but completely ambulatory  Blood pressure 123/67, pulse 102, temperature 97.4 F (36.3 C), temperature source Oral, resp. rate 18, height 5\' 5"  (1.651 m), weight 121 lb 14.4 oz (55.293 kg), SpO2 96 %.  LABORATORY DATA: Lab Results  Component Value Date   WBC 15.4* 04/11/2014   HGB 11.3* 04/11/2014   HCT 33.9* 04/11/2014   MCV 101.4* 04/11/2014   PLT 194 04/11/2014      Chemistry      Component Value Date/Time   NA 140 04/04/2014 1212   NA 138 01/18/2014 0640   K 4.3 04/04/2014 1212   K 4.5 01/18/2014 0640   CL 99 01/18/2014 0640   CO2 30* 04/04/2014 1212   CO2 26 01/18/2014 0640   BUN 14.4 04/04/2014 1212   BUN 19 01/18/2014 0640   CREATININE 0.8 04/04/2014 1212   CREATININE 0.63 01/18/2014 0640   CREATININE 0.80 01/12/2014 1519      Component Value Date/Time   CALCIUM 9.3 04/04/2014 1212   CALCIUM 9.0 01/18/2014 0640   ALKPHOS 104 04/04/2014 1212   ALKPHOS 88 01/18/2014 0640   AST 20 04/04/2014 1212   AST 31 01/18/2014 0640   ALT 17 04/04/2014 1212   ALT 18 01/18/2014 0640   BILITOT 0.42 04/04/2014 1212   BILITOT 0.4 01/18/2014 0640       RADIOGRAPHIC STUDIES: Ct Chest W Contrast  03/17/2014   CLINICAL DATA:  Restaging evaluation. Status post chemotherapy with radiation therapy in progress.  EXAM: CT CHEST WITH CONTRAST  TECHNIQUE: Multidetector CT imaging of the chest was performed during intravenous contrast administration.  CONTRAST:  34mL OMNIPAQUE IOHEXOL 300 MG/ML  SOLN  COMPARISON:  Chest CT 01/17/2014  FINDINGS: Visualized thyroid is unremarkable. No enlarged axillary, mediastinal or hilar lymphadenopathy. Normal heart size. No pericardial effusion. Normal caliber aorta  and main pulmonary artery.  Central airways are patent. Diffuse emphysematous change. Slight interval decrease in size of right lower lobe spiculated nodule measuring 11 x 6 mm, previously 12 x 7 mm. Unchanged right apical pleural parenchymal scarring. No pleural effusion pneumothorax.  Visualization of the upper abdomen demonstrates a stable subcentimeter low-attenuation lesion within the left hepatic lobe, too small to accurately characterize. No aggressive or acute appearing osseous lesions. Degenerative changes of the visualized thoracic spine.  IMPRESSION: Slight interval decrease in size of 11 mm right lower lobe spiculated nodule. Interval decrease in previously described right hilar adenopathy.   Electronically Signed   By: Lovey Newcomer M.D.   On: 03/17/2014 17:30   Ir Fluoro Guide Cv Line Right  04/10/2014   CLINICAL DATA:  Lung cancer  EXAM: TUNNEL POWER PORT PLACEMENT WITH SUBCUTANEOUS POCKET UTILIZING ULTRASOUND & FLOUROSCOPY  FLUOROSCOPY TIME:  30  seconds.  MEDICATIONS AND MEDICAL HISTORY: Versed 2 mg, Fentanyl 100 mcg.  As antibiotic prophylaxis, Ancef was ordered pre-procedure and administered intravenously within one hour of incision.  ANESTHESIA/SEDATION: Moderate sedation time: 30 minutes  CONTRAST:  None  PROCEDURE: After written informed consent was obtained, patient was placed in the supine position on angiographic table. The right neck and chest was prepped and draped in a sterile fashion. Lidocaine was utilized for local anesthesia. The right internal jugular vein was noted to be patent initially with ultrasound. Under sonographic guidance, a micropuncture needle was inserted into the right IJ vein (Ultrasound and fluoroscopic image documentation was performed). The needle was removed over an 018 wire which was exchanged for a Amplatz. This was advanced into the IVC. An 8-French dilator was advanced over the Amplatz.  A small incision was made in the right upper chest over the anterior  right second rib. Utilizing blunt dissection, a subcutaneous pocket was created in the caudal direction. The pocket was irrigated with a copious amount of sterile normal saline. The port catheter was tunneled from the chest incision, and out the neck incision. The reservoir was inserted into the subcutaneous pocket and secured with two 3-0 Ethilon stitches. A peel-away sheath was advanced over the Amplatz wire. The port catheter was cut to measure length and inserted through the peel-away sheath. The peel-away sheath was removed. The chest incision was closed with 3-0 Vicryl interrupted stitches for the subcutaneous tissue and a running of 4-0 Vicryl subcuticular stitch for the skin. The neck incision was closed with a 4-0 Vicryl subcuticular stitch. Derma-bond was applied to both surgical incisions. The port reservoir was flushed and instilled with heparinized saline. No complications.  FINDINGS: A right IJ vein Port-A-Cath is in place with its tip at the cavoatrial junction.  COMPLICATIONS: None  IMPRESSION: Successful 8 French right internal jugular vein power port placement with its tip at the SVC/RA junction.   Electronically Signed   By: Maryclare Bean M.D.   On: 04/10/2014 16:34   Ir US Guide Vasc Access Right  04/10/2014   CLINICAL DATA:  Lung cancer  EXAM: TUNNEL POWER PORT PLACEMENT WITH SUBCUTANEOUS POCKET UTILIZING ULTRASOUND & FLOUROSCOPY  FLUOROSCOPY TIME:  30 seconds.  MEDICATIONS AND MEDICAL HISTORY: Versed 2 mg, Fentanyl 100 mcg.  As antibiotic prophylaxis, Ancef was ordered pre-procedure and administered intravenously within one hour of incision.  ANESTHESIA/SEDATION: Moderate sedation time: 30 minutes  CONTRAST:  None  PROCEDURE: After written informed consent was obtained, patient was placed in the supine position on angiographic table. The right neck and chest was prepped and draped in a sterile fashion. Lidocaine was utilized for local anesthesia. The right internal jugular vein was noted to be  patent initially with ultrasound. Under sonographic guidance, a micropuncture needle was inserted into the right IJ vein (Ultrasound and fluoroscopic image documentation was performed). The needle was removed over an 018 wire which was exchanged for a Amplatz. This was advanced into the IVC. An 8-French dilator was advanced over the Amplatz.  A small incision was made in the right upper chest over the anterior right second rib. Utilizing blunt dissection, a subcutaneous pocket was created in the caudal direction. The pocket was irrigated with a copious amount of sterile normal saline. The port catheter was tunneled from the chest incision, and out the neck incision. The reservoir was inserted into the subcutaneous pocket and secured with two 3-0 Ethilon stitches. A peel-away sheath was advanced over the Amplatz wire. The port  catheter was cut to measure length and inserted through the peel-away sheath. The peel-away sheath was removed. The chest incision was closed with 3-0 Vicryl interrupted stitches for the subcutaneous tissue and a running of 4-0 Vicryl subcuticular stitch for the skin. The neck incision was closed with a 4-0 Vicryl subcuticular stitch. Derma-bond was applied to both surgical incisions. The port reservoir was flushed and instilled with heparinized saline. No complications.  FINDINGS: A right IJ vein Port-A-Cath is in place with its tip at the cavoatrial junction.  COMPLICATIONS: None  IMPRESSION: Successful 8 French right internal jugular vein power port placement with its tip at the SVC/RA junction.   Electronically Signed   By: Maryclare Bean M.D.   On: 04/10/2014 16:34    ASSESSMENT AND PLAN: This is a very pleasant 80 years old white male recently diagnosed with limited stage small cell lung cancer and currently undergoing systemic chemotherapy with cisplatin and etoposide status post 3 cycle. He tolerated his treatment fairly well with no significant adverse effects. I recommended for the  patient to proceed with cycle #4 today as scheduled. He would come back for followup visit in 3 weeks for evaluation with repeat CT scan of the chest before starting cycle #5. He was advised to call immediately if he has any concerning symptoms in the interval. The patient voices understanding of current disease status and treatment options and is in agreement with the current care plan.  All questions were answered. The patient knows to call the clinic with any problems, questions or concerns. We can certainly see the patient much sooner if necessary.  Disclaimer: This note was dictated with voice recognition software. Similar sounding words can inadvertently be transcribed and may not be corrected upon review.

## 2014-04-12 ENCOUNTER — Ambulatory Visit (HOSPITAL_BASED_OUTPATIENT_CLINIC_OR_DEPARTMENT_OTHER): Payer: Medicare Other

## 2014-04-12 DIAGNOSIS — C3491 Malignant neoplasm of unspecified part of right bronchus or lung: Secondary | ICD-10-CM

## 2014-04-12 DIAGNOSIS — Z5111 Encounter for antineoplastic chemotherapy: Secondary | ICD-10-CM

## 2014-04-12 DIAGNOSIS — C7A1 Malignant poorly differentiated neuroendocrine tumors: Secondary | ICD-10-CM

## 2014-04-12 MED ORDER — DEXAMETHASONE SODIUM PHOSPHATE 10 MG/ML IJ SOLN
10.0000 mg | Freq: Once | INTRAMUSCULAR | Status: AC
  Administered 2014-04-12: 10 mg via INTRAVENOUS

## 2014-04-12 MED ORDER — SODIUM CHLORIDE 0.9 % IV SOLN
Freq: Once | INTRAVENOUS | Status: AC
  Administered 2014-04-12: 12:00:00 via INTRAVENOUS

## 2014-04-12 MED ORDER — DEXAMETHASONE SODIUM PHOSPHATE 10 MG/ML IJ SOLN
INTRAMUSCULAR | Status: AC
  Filled 2014-04-12: qty 1

## 2014-04-12 MED ORDER — HEPARIN SOD (PORK) LOCK FLUSH 100 UNIT/ML IV SOLN
500.0000 [IU] | Freq: Once | INTRAVENOUS | Status: AC | PRN
  Administered 2014-04-12: 500 [IU]
  Filled 2014-04-12: qty 5

## 2014-04-12 MED ORDER — ETOPOSIDE CHEMO INJECTION 1 GM/50ML
120.0000 mg/m2 | Freq: Once | INTRAVENOUS | Status: AC
  Administered 2014-04-12: 190 mg via INTRAVENOUS
  Filled 2014-04-12: qty 9.5

## 2014-04-12 MED ORDER — SODIUM CHLORIDE 0.9 % IJ SOLN
10.0000 mL | INTRAMUSCULAR | Status: DC | PRN
Start: 2014-04-12 — End: 2014-04-12
  Administered 2014-04-12: 10 mL
  Filled 2014-04-12: qty 10

## 2014-04-12 NOTE — Patient Instructions (Signed)
[facility] Discharge Instructions for Patients Receiving Chemotherapy  Today you received the following chemotherapy agents:   To help prevent nausea and vomiting after your treatment, we encourage you to take your nausea medication.  Take it as often as prescribed.     If you develop nausea and vomiting that is not controlled by your nausea medication, call the clinic. If it is after clinic hours your family physician or the after hours number for the clinic or go to the Emergency Department.   BELOW ARE SYMPTOMS THAT SHOULD BE REPORTED IMMEDIATELY:  *FEVER GREATER THAN 100.5 F  *CHILLS WITH OR WITHOUT FEVER  NAUSEA AND VOMITING THAT IS NOT CONTROLLED WITH YOUR NAUSEA MEDICATION  *UNUSUAL SHORTNESS OF BREATH  *UNUSUAL BRUISING OR BLEEDING  TENDERNESS IN MOUTH AND THROAT WITH OR WITHOUT PRESENCE OF ULCERS  *URINARY PROBLEMS  *BOWEL PROBLEMS  UNUSUAL RASH Items with * indicate a potential emergency and should be followed up as soon as possible.  Feel free to call the clinic you have any questions or concerns. The clinic phone number is [phone].   I have been informed and understand all the instructions given to me. I know to contact the clinic, my physician, or go to the Emergency Department if any problems should occur. I do not have any questions at this time, but understand that I may call the clinic during office hours   should I have any questions or need assistance in obtaining follow up care.    __________________________________________  _____________  __________ Signature of Patient or Authorized Representative            Date                   Time    __________________________________________ Nurse's Signature

## 2014-04-13 ENCOUNTER — Ambulatory Visit (HOSPITAL_BASED_OUTPATIENT_CLINIC_OR_DEPARTMENT_OTHER): Payer: Medicare Other

## 2014-04-13 DIAGNOSIS — C7A1 Malignant poorly differentiated neuroendocrine tumors: Secondary | ICD-10-CM

## 2014-04-13 DIAGNOSIS — C3491 Malignant neoplasm of unspecified part of right bronchus or lung: Secondary | ICD-10-CM

## 2014-04-13 DIAGNOSIS — Z5111 Encounter for antineoplastic chemotherapy: Secondary | ICD-10-CM

## 2014-04-13 MED ORDER — DEXAMETHASONE SODIUM PHOSPHATE 10 MG/ML IJ SOLN
10.0000 mg | Freq: Once | INTRAMUSCULAR | Status: AC
  Administered 2014-04-13: 10 mg via INTRAVENOUS

## 2014-04-13 MED ORDER — SODIUM CHLORIDE 0.9 % IJ SOLN
10.0000 mL | INTRAMUSCULAR | Status: DC | PRN
  Administered 2014-04-13: 10 mL
  Filled 2014-04-13: qty 10

## 2014-04-13 MED ORDER — SODIUM CHLORIDE 0.9 % IV SOLN
120.0000 mg/m2 | Freq: Once | INTRAVENOUS | Status: AC
  Administered 2014-04-13: 190 mg via INTRAVENOUS
  Filled 2014-04-13: qty 9.5

## 2014-04-13 MED ORDER — SODIUM CHLORIDE 0.9 % IV SOLN
Freq: Once | INTRAVENOUS | Status: AC
  Administered 2014-04-13: 13:00:00 via INTRAVENOUS

## 2014-04-13 MED ORDER — HEPARIN SOD (PORK) LOCK FLUSH 100 UNIT/ML IV SOLN
500.0000 [IU] | Freq: Once | INTRAVENOUS | Status: AC | PRN
  Administered 2014-04-13: 500 [IU]
  Filled 2014-04-13: qty 5

## 2014-04-13 NOTE — Patient Instructions (Signed)
[facility] Discharge Instructions for Patients Receiving Chemotherapy  Today you received the following chemotherapy agents Etoposide.  To help prevent nausea and vomiting after your treatment, we encourage you to take your nausea medication.   If you develop nausea and vomiting that is not controlled by your nausea medication, call the clinic.   BELOW ARE SYMPTOMS THAT SHOULD BE REPORTED IMMEDIATELY:  *FEVER GREATER THAN 100.5 F  *CHILLS WITH OR WITHOUT FEVER  NAUSEA AND VOMITING THAT IS NOT CONTROLLED WITH YOUR NAUSEA MEDICATION  *UNUSUAL SHORTNESS OF BREATH  *UNUSUAL BRUISING OR BLEEDING  TENDERNESS IN MOUTH AND THROAT WITH OR WITHOUT PRESENCE OF ULCERS  *URINARY PROBLEMS  *BOWEL PROBLEMS  UNUSUAL RASH Items with * indicate a potential emergency and should be followed up as soon as possible.  Feel free to call the clinic you have any questions or concerns. The clinic phone number is [phone].

## 2014-04-14 ENCOUNTER — Ambulatory Visit (HOSPITAL_BASED_OUTPATIENT_CLINIC_OR_DEPARTMENT_OTHER): Payer: Medicare Other

## 2014-04-14 DIAGNOSIS — C7A1 Malignant poorly differentiated neuroendocrine tumors: Secondary | ICD-10-CM

## 2014-04-14 DIAGNOSIS — Z5189 Encounter for other specified aftercare: Secondary | ICD-10-CM

## 2014-04-14 DIAGNOSIS — C3491 Malignant neoplasm of unspecified part of right bronchus or lung: Secondary | ICD-10-CM

## 2014-04-14 MED ORDER — PEGFILGRASTIM INJECTION 6 MG/0.6ML ~~LOC~~
6.0000 mg | PREFILLED_SYRINGE | Freq: Once | SUBCUTANEOUS | Status: AC
  Administered 2014-04-14: 6 mg via SUBCUTANEOUS
  Filled 2014-04-14: qty 0.6

## 2014-04-14 NOTE — Patient Instructions (Signed)
Pegfilgrastim injection What is this medicine? PEGFILGRASTIM (peg fil GRA stim) is a long-acting granulocyte colony-stimulating factor that stimulates the growth of neutrophils, a type of white blood cell important in the body's fight against infection. It is used to reduce the incidence of fever and infection in patients with certain types of cancer who are receiving chemotherapy that affects the bone marrow. This medicine may be used for other purposes; ask your health care provider or pharmacist if you have questions. COMMON BRAND NAME(S): Neulasta What should I tell my health care provider before I take this medicine? They need to know if you have any of these conditions: -latex allergy -ongoing radiation therapy -sickle cell disease -skin reactions to acrylic adhesives (On-Body Injector only) -an unusual or allergic reaction to pegfilgrastim, filgrastim, other medicines, foods, dyes, or preservatives -pregnant or trying to get pregnant -breast-feeding How should I use this medicine? This medicine is for injection under the skin. If you get this medicine at home, you will be taught how to prepare and give the pre-filled syringe or how to use the On-body Injector. Refer to the patient Instructions for Use for detailed instructions. Use exactly as directed. Take your medicine at regular intervals. Do not take your medicine more often than directed. It is important that you put your used needles and syringes in a special sharps container. Do not put them in a trash can. If you do not have a sharps container, call your pharmacist or healthcare provider to get one. Talk to your pediatrician regarding the use of this medicine in children. Special care may be needed. Overdosage: If you think you have taken too much of this medicine contact a poison control center or emergency room at once. NOTE: This medicine is only for you. Do not share this medicine with others. What if I miss a dose? It is  important not to miss your dose. Call your doctor or health care professional if you miss your dose. If you miss a dose due to an On-body Injector failure or leakage, a new dose should be administered as soon as possible using a single prefilled syringe for manual use. What may interact with this medicine? Interactions have not been studied. Give your health care provider a list of all the medicines, herbs, non-prescription drugs, or dietary supplements you use. Also tell them if you smoke, drink alcohol, or use illegal drugs. Some items may interact with your medicine. This list may not describe all possible interactions. Give your health care provider a list of all the medicines, herbs, non-prescription drugs, or dietary supplements you use. Also tell them if you smoke, drink alcohol, or use illegal drugs. Some items may interact with your medicine. What should I watch for while using this medicine? You may need blood work done while you are taking this medicine. If you are going to need a MRI, CT scan, or other procedure, tell your doctor that you are using this medicine (On-Body Injector only). What side effects may I notice from receiving this medicine? Side effects that you should report to your doctor or health care professional as soon as possible: -allergic reactions like skin rash, itching or hives, swelling of the face, lips, or tongue -dizziness -fever -pain, redness, or irritation at site where injected -pinpoint red spots on the skin -shortness of breath or breathing problems -stomach or side pain, or pain at the shoulder -swelling -tiredness -trouble passing urine Side effects that usually do not require medical attention (report to your doctor   or health care professional if they continue or are bothersome): -bone pain -muscle pain This list may not describe all possible side effects. Call your doctor for medical advice about side effects. You may report side effects to FDA at  1-800-FDA-1088. Where should I keep my medicine? Keep out of the reach of children. Store pre-filled syringes in a refrigerator between 2 and 8 degrees C (36 and 46 degrees F). Do not freeze. Keep in carton to protect from light. Throw away this medicine if it is left out of the refrigerator for more than 48 hours. Throw away any unused medicine after the expiration date. NOTE: This sheet is a summary. It may not cover all possible information. If you have questions about this medicine, talk to your doctor, pharmacist, or health care provider.  2015, [employer] Standard. (2013-08-11 16:14:05)  

## 2014-04-18 ENCOUNTER — Other Ambulatory Visit (HOSPITAL_BASED_OUTPATIENT_CLINIC_OR_DEPARTMENT_OTHER): Payer: Medicare Other

## 2014-04-18 DIAGNOSIS — C3491 Malignant neoplasm of unspecified part of right bronchus or lung: Secondary | ICD-10-CM

## 2014-04-18 DIAGNOSIS — C7A1 Malignant poorly differentiated neuroendocrine tumors: Secondary | ICD-10-CM

## 2014-04-18 LAB — CBC WITH DIFFERENTIAL/PLATELET
BASO%: 0.3 % (ref 0.0–2.0)
BASOS ABS: 0 10*3/uL (ref 0.0–0.1)
EOS ABS: 0 10*3/uL (ref 0.0–0.5)
EOS%: 0.4 % (ref 0.0–7.0)
HEMATOCRIT: 33.4 % — AB (ref 38.4–49.9)
HEMOGLOBIN: 10.9 g/dL — AB (ref 13.0–17.1)
LYMPH%: 4.8 % — ABNORMAL LOW (ref 14.0–49.0)
MCH: 33.9 pg — ABNORMAL HIGH (ref 27.2–33.4)
MCHC: 32.8 g/dL (ref 32.0–36.0)
MCV: 103.4 fL — AB (ref 79.3–98.0)
MONO#: 0.2 10*3/uL (ref 0.1–0.9)
MONO%: 3.3 % (ref 0.0–14.0)
NEUT%: 91.2 % — ABNORMAL HIGH (ref 39.0–75.0)
NEUTROS ABS: 6 10*3/uL (ref 1.5–6.5)
Platelets: 115 10*3/uL — ABNORMAL LOW (ref 140–400)
RBC: 3.23 10*6/uL — ABNORMAL LOW (ref 4.20–5.82)
RDW: 19.2 % — ABNORMAL HIGH (ref 11.0–14.6)
WBC: 6.6 10*3/uL (ref 4.0–10.3)
lymph#: 0.3 10*3/uL — ABNORMAL LOW (ref 0.9–3.3)

## 2014-04-18 LAB — MAGNESIUM (CC13): MAGNESIUM: 2 mg/dL (ref 1.5–2.5)

## 2014-04-18 LAB — COMPREHENSIVE METABOLIC PANEL (CC13)
ALBUMIN: 3.7 g/dL (ref 3.5–5.0)
ALT: 14 U/L (ref 0–55)
AST: 17 U/L (ref 5–34)
Alkaline Phosphatase: 131 U/L (ref 40–150)
Anion Gap: 10 mEq/L (ref 3–11)
BUN: 34.8 mg/dL — ABNORMAL HIGH (ref 7.0–26.0)
CALCIUM: 9.4 mg/dL (ref 8.4–10.4)
CO2: 32 mEq/L — ABNORMAL HIGH (ref 22–29)
Chloride: 103 mEq/L (ref 98–109)
Creatinine: 0.8 mg/dL (ref 0.7–1.3)
GLUCOSE: 124 mg/dL (ref 70–140)
POTASSIUM: 4.2 meq/L (ref 3.5–5.1)
Sodium: 144 mEq/L (ref 136–145)
Total Bilirubin: 0.67 mg/dL (ref 0.20–1.20)
Total Protein: 6.4 g/dL (ref 6.4–8.3)

## 2014-04-25 ENCOUNTER — Other Ambulatory Visit (HOSPITAL_BASED_OUTPATIENT_CLINIC_OR_DEPARTMENT_OTHER): Payer: Medicare Other

## 2014-04-25 ENCOUNTER — Other Ambulatory Visit: Payer: Self-pay | Admitting: Nurse Practitioner

## 2014-04-25 DIAGNOSIS — C3491 Malignant neoplasm of unspecified part of right bronchus or lung: Secondary | ICD-10-CM

## 2014-04-25 DIAGNOSIS — C7A1 Malignant poorly differentiated neuroendocrine tumors: Secondary | ICD-10-CM

## 2014-04-25 LAB — CBC WITH DIFFERENTIAL/PLATELET
BASO%: 0.2 % (ref 0.0–2.0)
Basophils Absolute: 0 10*3/uL (ref 0.0–0.1)
EOS%: 1 % (ref 0.0–7.0)
Eosinophils Absolute: 0.1 10*3/uL (ref 0.0–0.5)
HCT: 31.3 % — ABNORMAL LOW (ref 38.4–49.9)
HGB: 10.2 g/dL — ABNORMAL LOW (ref 13.0–17.1)
LYMPH#: 0.5 10*3/uL — AB (ref 0.9–3.3)
LYMPH%: 4.7 % — ABNORMAL LOW (ref 14.0–49.0)
MCH: 34.5 pg — AB (ref 27.2–33.4)
MCHC: 32.7 g/dL (ref 32.0–36.0)
MCV: 105.6 fL — ABNORMAL HIGH (ref 79.3–98.0)
MONO#: 0.9 10*3/uL (ref 0.1–0.9)
MONO%: 9.1 % (ref 0.0–14.0)
NEUT#: 8.8 10*3/uL — ABNORMAL HIGH (ref 1.5–6.5)
NEUT%: 85 % — ABNORMAL HIGH (ref 39.0–75.0)
Platelets: 93 10*3/uL — ABNORMAL LOW (ref 140–400)
RBC: 2.96 10*6/uL — ABNORMAL LOW (ref 4.20–5.82)
RDW: 20.8 % — AB (ref 11.0–14.6)
WBC: 10.3 10*3/uL (ref 4.0–10.3)

## 2014-04-25 LAB — COMPREHENSIVE METABOLIC PANEL (CC13)
ALBUMIN: 3.6 g/dL (ref 3.5–5.0)
ALT: 16 U/L (ref 0–55)
AST: 20 U/L (ref 5–34)
Alkaline Phosphatase: 118 U/L (ref 40–150)
Anion Gap: 10 mEq/L (ref 3–11)
BUN: 18.5 mg/dL (ref 7.0–26.0)
CHLORIDE: 104 meq/L (ref 98–109)
CO2: 29 mEq/L (ref 22–29)
Calcium: 9.1 mg/dL (ref 8.4–10.4)
Creatinine: 0.8 mg/dL (ref 0.7–1.3)
Glucose: 120 mg/dl (ref 70–140)
POTASSIUM: 3.9 meq/L (ref 3.5–5.1)
SODIUM: 142 meq/L (ref 136–145)
TOTAL PROTEIN: 6.3 g/dL — AB (ref 6.4–8.3)
Total Bilirubin: 0.38 mg/dL (ref 0.20–1.20)

## 2014-04-25 LAB — MAGNESIUM (CC13): Magnesium: 2.1 mg/dl (ref 1.5–2.5)

## 2014-04-28 ENCOUNTER — Telehealth: Payer: Self-pay | Admitting: *Deleted

## 2014-04-28 ENCOUNTER — Telehealth: Payer: Self-pay | Admitting: Medical Oncology

## 2014-04-28 ENCOUNTER — Ambulatory Visit: Admission: RE | Admit: 2014-04-28 | Payer: Medicare Other | Source: Ambulatory Visit | Admitting: Radiation Oncology

## 2014-04-28 NOTE — Telephone Encounter (Signed)
I could not leave a message because his mailbox is full. I called wife's phone and pt answered . I told him Cyndee did not want to prescribe more antibiotics for knot on his arm.  I recommended he use warm moist compresses. He says he still has the knot on his arm and it bothers him when he straightens arm out. I gave pt an appt for symptom management on Monday after Ct scan. I reinforced to call for any fever over 100.4 or worsening/change in arm including red streak appearance. He voices understanding.

## 2014-04-28 NOTE — Telephone Encounter (Signed)
CALLED PATIENT TO ASK QUESTION, LVM FOR A RETURN CALL 

## 2014-05-01 ENCOUNTER — Ambulatory Visit (HOSPITAL_COMMUNITY)
Admission: RE | Admit: 2014-05-01 | Discharge: 2014-05-01 | Disposition: A | Discharge status: Home / Self Care | Payer: Medicare Other | Source: Ambulatory Visit | Attending: Internal Medicine | Admitting: Internal Medicine

## 2014-05-01 ENCOUNTER — Ambulatory Visit (HOSPITAL_BASED_OUTPATIENT_CLINIC_OR_DEPARTMENT_OTHER): Payer: Medicare Other | Admitting: Nurse Practitioner

## 2014-05-01 ENCOUNTER — Encounter (HOSPITAL_COMMUNITY): Payer: Self-pay

## 2014-05-01 VITALS — BP 125/78 | HR 109 | Temp 97.5°F | Resp 18 | Ht 65.0 in | Wt 122.5 lb

## 2014-05-01 DIAGNOSIS — C3491 Malignant neoplasm of unspecified part of right bronchus or lung: Secondary | ICD-10-CM | POA: Insufficient documentation

## 2014-05-01 DIAGNOSIS — F17200 Nicotine dependence, unspecified, uncomplicated: Secondary | ICD-10-CM | POA: Insufficient documentation

## 2014-05-01 DIAGNOSIS — R634 Abnormal weight loss: Secondary | ICD-10-CM | POA: Insufficient documentation

## 2014-05-01 DIAGNOSIS — R911 Solitary pulmonary nodule: Secondary | ICD-10-CM | POA: Diagnosis not present

## 2014-05-01 DIAGNOSIS — I809 Phlebitis and thrombophlebitis of unspecified site: Secondary | ICD-10-CM

## 2014-05-01 DIAGNOSIS — R05 Cough: Secondary | ICD-10-CM | POA: Diagnosis not present

## 2014-05-01 DIAGNOSIS — C7A1 Malignant poorly differentiated neuroendocrine tumors: Secondary | ICD-10-CM

## 2014-05-01 MED ORDER — IOHEXOL 300 MG/ML  SOLN
80.0000 mL | Freq: Once | INTRAMUSCULAR | Status: AC | PRN
  Administered 2014-05-01: 80 mL via INTRAVENOUS

## 2014-05-01 MED ORDER — CEPHALEXIN 500 MG PO CAPS: 500.0000 mg | ORAL_CAPSULE | Freq: Four times a day (QID) | ORAL | Status: DC

## 2014-05-02 ENCOUNTER — Ambulatory Visit: Payer: Medicare Other | Admitting: Nutrition

## 2014-05-02 ENCOUNTER — Telehealth: Payer: Self-pay | Admitting: *Deleted

## 2014-05-02 ENCOUNTER — Encounter: Payer: Self-pay | Admitting: Nurse Practitioner

## 2014-05-02 ENCOUNTER — Telehealth: Payer: Self-pay | Admitting: Physician Assistant

## 2014-05-02 ENCOUNTER — Ambulatory Visit (HOSPITAL_BASED_OUTPATIENT_CLINIC_OR_DEPARTMENT_OTHER): Payer: Medicare Other | Admitting: Physician Assistant

## 2014-05-02 ENCOUNTER — Ambulatory Visit (HOSPITAL_BASED_OUTPATIENT_CLINIC_OR_DEPARTMENT_OTHER): Payer: Medicare Other

## 2014-05-02 ENCOUNTER — Encounter: Payer: Self-pay | Admitting: Physician Assistant

## 2014-05-02 ENCOUNTER — Other Ambulatory Visit (HOSPITAL_BASED_OUTPATIENT_CLINIC_OR_DEPARTMENT_OTHER): Payer: Medicare Other

## 2014-05-02 ENCOUNTER — Ambulatory Visit: Payer: Medicare Other

## 2014-05-02 VITALS — BP 112/61 | HR 102 | Temp 97.6°F | Resp 17 | Ht 65.0 in | Wt 121.8 lb

## 2014-05-02 DIAGNOSIS — C3491 Malignant neoplasm of unspecified part of right bronchus or lung: Secondary | ICD-10-CM

## 2014-05-02 DIAGNOSIS — Z95828 Presence of other vascular implants and grafts: Secondary | ICD-10-CM

## 2014-05-02 DIAGNOSIS — C7A1 Malignant poorly differentiated neuroendocrine tumors: Secondary | ICD-10-CM

## 2014-05-02 DIAGNOSIS — Z5111 Encounter for antineoplastic chemotherapy: Secondary | ICD-10-CM

## 2014-05-02 LAB — COMPREHENSIVE METABOLIC PANEL (CC13)
ALT: 18 U/L (ref 0–55)
AST: 25 U/L (ref 5–34)
Albumin: 3.7 g/dL (ref 3.5–5.0)
Alkaline Phosphatase: 89 U/L (ref 40–150)
Anion Gap: 9 mEq/L (ref 3–11)
BUN: 21.3 mg/dL (ref 7.0–26.0)
CALCIUM: 9.1 mg/dL (ref 8.4–10.4)
CHLORIDE: 107 meq/L (ref 98–109)
CO2: 26 mEq/L (ref 22–29)
CREATININE: 0.8 mg/dL (ref 0.7–1.3)
EGFR: 85 mL/min/{1.73_m2} — ABNORMAL LOW (ref >90)
Glucose: 106 mg/dl (ref 70–140)
Potassium: 4 mEq/L (ref 3.5–5.1)
Sodium: 142 mEq/L (ref 136–145)
Total Bilirubin: 0.38 mg/dL (ref 0.20–1.20)
Total Protein: 6.2 g/dL — ABNORMAL LOW (ref 6.4–8.3)

## 2014-05-02 LAB — CBC WITH DIFFERENTIAL/PLATELET
BASO%: 0.5 % (ref 0.0–2.0)
Basophils Absolute: 0 10*3/uL (ref 0.0–0.1)
EOS ABS: 0.1 10*3/uL (ref 0.0–0.5)
EOS%: 1.1 % (ref 0.0–7.0)
HCT: 31.9 % — ABNORMAL LOW (ref 38.4–49.9)
HGB: 10.5 g/dL — ABNORMAL LOW (ref 13.0–17.1)
LYMPH%: 4.4 % — AB (ref 14.0–49.0)
MCH: 35 pg — ABNORMAL HIGH (ref 27.2–33.4)
MCHC: 32.8 g/dL (ref 32.0–36.0)
MCV: 106.7 fL — ABNORMAL HIGH (ref 79.3–98.0)
MONO#: 1 10*3/uL — ABNORMAL HIGH (ref 0.1–0.9)
MONO%: 10.6 % (ref 0.0–14.0)
NEUT%: 83.4 % — ABNORMAL HIGH (ref 39.0–75.0)
NEUTROS ABS: 7.5 10*3/uL — AB (ref 1.5–6.5)
PLATELETS: 186 10*3/uL (ref 140–400)
RBC: 2.99 10*6/uL — ABNORMAL LOW (ref 4.20–5.82)
RDW: 21 % — ABNORMAL HIGH (ref 11.0–14.6)
WBC: 8.9 10*3/uL (ref 4.0–10.3)
lymph#: 0.4 10*3/uL — ABNORMAL LOW (ref 0.9–3.3)

## 2014-05-02 LAB — MAGNESIUM (CC13): Magnesium: 2.2 mg/dl (ref 1.5–2.5)

## 2014-05-02 MED ORDER — DEXAMETHASONE SODIUM PHOSPHATE 20 MG/5ML IJ SOLN
INTRAMUSCULAR | Status: AC
  Filled 2014-05-02: qty 5

## 2014-05-02 MED ORDER — DEXAMETHASONE SODIUM PHOSPHATE 20 MG/5ML IJ SOLN
12.0000 mg | Freq: Once | INTRAMUSCULAR | Status: AC
  Administered 2014-05-02: 12 mg via INTRAVENOUS

## 2014-05-02 MED ORDER — SODIUM CHLORIDE 0.9 % IV SOLN
60.0000 mg/m2 | Freq: Once | INTRAVENOUS | Status: AC
  Administered 2014-05-02: 94 mg via INTRAVENOUS
  Filled 2014-05-02: qty 94

## 2014-05-02 MED ORDER — HEPARIN SOD (PORK) LOCK FLUSH 100 UNIT/ML IV SOLN
500.0000 [IU] | Freq: Once | INTRAVENOUS | Status: AC
  Administered 2014-05-02: 500 [IU] via INTRAVENOUS
  Filled 2014-05-02: qty 5

## 2014-05-02 MED ORDER — POTASSIUM CHLORIDE 2 MEQ/ML IV SOLN
Freq: Once | INTRAVENOUS | Status: AC
  Administered 2014-05-02: 11:00:00 via INTRAVENOUS
  Filled 2014-05-02: qty 10

## 2014-05-02 MED ORDER — SODIUM CHLORIDE 0.9 % IJ SOLN
10.0000 mL | INTRAMUSCULAR | Status: DC | PRN
  Administered 2014-05-02: 10 mL
  Filled 2014-05-02: qty 10

## 2014-05-02 MED ORDER — SODIUM CHLORIDE 0.9 % IJ SOLN
10.0000 mL | INTRAMUSCULAR | Status: DC | PRN
  Administered 2014-05-02: 10 mL via INTRAVENOUS
  Filled 2014-05-02: qty 10

## 2014-05-02 MED ORDER — SODIUM CHLORIDE 0.9 % IV SOLN
Freq: Once | INTRAVENOUS | Status: AC
  Administered 2014-05-02: 11:00:00 via INTRAVENOUS

## 2014-05-02 MED ORDER — PALONOSETRON HCL INJECTION 0.25 MG/5ML
INTRAVENOUS | Status: AC
  Filled 2014-05-02: qty 5

## 2014-05-02 MED ORDER — SODIUM CHLORIDE 0.9 % IV SOLN
120.0000 mg/m2 | Freq: Once | INTRAVENOUS | Status: AC
  Administered 2014-05-02: 190 mg via INTRAVENOUS
  Filled 2014-05-02: qty 9.5

## 2014-05-02 MED ORDER — HEPARIN SOD (PORK) LOCK FLUSH 100 UNIT/ML IV SOLN
500.0000 [IU] | Freq: Once | INTRAVENOUS | Status: AC | PRN
  Administered 2014-05-02: 500 [IU]
  Filled 2014-05-02: qty 5

## 2014-05-02 MED ORDER — SODIUM CHLORIDE 0.9 % IV SOLN
150.0000 mg | Freq: Once | INTRAVENOUS | Status: AC
  Administered 2014-05-02: 150 mg via INTRAVENOUS
  Filled 2014-05-02: qty 5

## 2014-05-02 MED ORDER — PALONOSETRON HCL INJECTION 0.25 MG/5ML
0.2500 mg | Freq: Once | INTRAVENOUS | Status: AC
  Administered 2014-05-02: 0.25 mg via INTRAVENOUS

## 2014-05-02 NOTE — Assessment & Plan Note (Signed)
Patient has been treated for both left and right arm phlebitis after either lab draws or IV fluid administration to bilateral arms within this past month or so.  Patient obtained a Port-A-Cath due to recurrent phlebitis issues.  Patient reports that he obtain a lab draw to his left forearm just last week; and now presents to the [facility] again with the same symptoms of erythema, mild edema, and mild tenderness to the left antecubital region.  This area of phlebitis is directly above the previous phlebitis area.  Both the left and the right forearm previous phlebitis areas have palpable, corded knots.  Patient will once again be prescribed Keflex to take in case there is any mild cellulitis to the site.  Patient was also instructed to continue with ibuprofen on appearing basis; as well as cold compresses.  Advised both patient and his wife that these areas of phlebitis; and residual palpable corded knots may take several months to resolve completely.  Patient and his wife were advised to call or go directed to the emergency department if his develop any worsening symptoms whatsoever.

## 2014-05-02 NOTE — Patient Instructions (Signed)

## 2014-05-02 NOTE — Progress Notes (Addendum)
[facility] [phone]   [phone]  OFFICE PROGRESS NOTE  [name], MD [address] [place] [address]  DIAGNOSIS: Limited stage small cell lung cancer, stage IIA (T1a, N1, M0) presented with right lower lobe nodule in addition to right hilar lymphadenopathy diagnosed in August of 2015.  PRIOR THERAPY: None.  CURRENT THERAPY: Systemic chemotherapy with cisplatin 60 mg/M2 on day 1 and etoposide at 120 mg/M2 on days 1, 2 and 3 with Neulasta support on day 4. Status post 4 cycles. He also received a course of concurrent radiotherapy under the care of Dr. [name].  INTERVAL HISTORY: Patrick Carr 80 y.o. male returns to the clinic today for followup visit accompanied by his wife. The patient is tolerating his systemic chemotherapy with cisplatin and etoposide fairly well with no significant adverse effects. He denied having any fever or chills. He has no nausea or vomiting. He denied having any significant chest pain, shortness of breath, cough or hemoptysis. He has no significant weight loss or night sweats. He does complain of some right hip pain as well as some pain in his upper back and neck area. His upper back and neck pain was present prior to the initiation of chemotherapy. He denies any numbness or tingling into the neck or extremities.  He is here today to start cycle #5 of his chemotherapy.  MEDICAL HISTORY: Past Medical History  Diagnosis Date  . Hypertension   . Hyperlipidemia   . BPH (benign prostatic hyperplasia)   . Lesion of right lung     RLL  . RBBB     no palpations  . Renal cysts, acquired, bilateral   . History of kidney stones   . Hernia, inguinal left   . History of blood transfusion     artery in sinus bleed  . Phlebitis 03/20/14    bilateral forearms    ALLERGIES:  is allergic to other and pneumococcal vaccines.  MEDICATIONS:  Current Outpatient Prescriptions  Medication Sig  Dispense Refill  . alfuzosin (UROXATRAL) 10 MG 24 hr tablet Take 10 mg by mouth daily.    [name] amLODipine (NORVASC) 5 MG tablet Take 5 mg by mouth daily.    [name] aspirin 81 MG tablet Take 81 mg by mouth daily.    [name] atorvastatin (LIPITOR) 40 MG tablet Take 40 mg by mouth daily.    . cephALEXin (KEFLEX) 500 MG capsule Take 1 capsule (500 mg total) by mouth 4 (four) times daily. 40 capsule 0  . lidocaine-prilocaine (EMLA) cream Apply 1 application topically as needed. Apply over port site 1 hour prior to chemo. Do not rub in medicine. 30 g 0  . tamsulosin (FLOMAX) 0.4 MG CAPS capsule Take 0.4 mg by mouth daily.    [name] ALPRAZolam (XANAX) 0.25 MG tablet Take 0.25 mg by mouth 3 (three) times daily as needed for anxiety.    [name] losartan (COZAAR) 100 MG tablet Take 100 mg by mouth daily.    . prochlorperazine (COMPAZINE) 10 MG tablet Take 1 tablet (10 mg total) by mouth every 6 (six) hours as needed for nausea or vomiting. (Patient not taking: Reported on 05/02/2014) 30 tablet 0   No current facility-administered medications for this visit.   Facility-Administered Medications Ordered in Other Visits  Medication Dose Route Frequency Provider Last Rate Last Dose  . CISplatin (PLATINOL) 94 mg in sodium chloride 0.9 % 250 mL chemo infusion  60 mg/m2 (Treatment Plan Actual) Intravenous  Once [name], MD 344 mL/hr at 05/02/14 1549 94 mg at 05/02/14 1549  . heparin lock flush 100 unit/mL  500 Units Intracatheter Once PRN [name], MD      . sodium chloride 0.9 % injection 10 mL  10 mL Intracatheter PRN [name], MD        SURGICAL HISTORY:  Past Surgical History  Procedure Laterality Date  . Sinus artery surgery      not surgery  . No past surgeries    . Video bronchoscopy with endobronchial navigation N/A 01/18/2014    Procedure: VIDEO BRONCHOSCOPY WITH ENDOBRONCHIAL NAVIGATION;  Surgeon: [name], MD;  Location: [facility] OR;  Service: Thoracic;  Laterality: N/A;  . Video bronchoscopy with  endobronchial ultrasound N/A 01/18/2014    Procedure: VIDEO BRONCHOSCOPY WITH ENDOBRONCHIAL ULTRASOUND;  Surgeon: [name], MD;  Location: [facility];  Service: Thoracic;  Laterality: N/A;    REVIEW OF SYSTEMS:  A comprehensive review of systems was negative.   PHYSICAL EXAMINATION: General appearance: alert, cooperative and no distress Head: Normocephalic, without obvious abnormality, atraumatic Neck: no adenopathy, no JVD, supple, symmetrical, trachea midline and thyroid not enlarged, symmetric, no tenderness/mass/nodules Lymph nodes: Cervical, supraclavicular, and axillary nodes normal. Resp: clear to auscultation bilaterally Back: symmetric, no curvature. ROM normal. No CVA tenderness. Cardio: regular rate and rhythm, S1, S2 normal, no murmur, click, rub or gallop GI: soft, non-tender; bowel sounds normal; no masses,  no organomegaly Extremities: extremities normal, atraumatic, no cyanosis or edema  ECOG PERFORMANCE STATUS: 1 - Symptomatic but completely ambulatory  Blood pressure 112/61, pulse 102, temperature 97.6 F (36.4 C), temperature source Oral, resp. rate 17, height 5\' 5"  (1.651 m), weight 121 lb 12.8 oz (55.248 kg), SpO2 94 %.  LABORATORY DATA: Lab Results  Component Value Date   WBC 8.9 05/02/2014   HGB 10.5* 05/02/2014   HCT 31.9* 05/02/2014   MCV 106.7* 05/02/2014   PLT 186 05/02/2014      Chemistry      Component Value Date/Time   NA 142 05/02/2014 0944   NA 138 01/18/2014 0640   K 4.0 05/02/2014 0944   K 4.5 01/18/2014 0640   CL 99 01/18/2014 0640   CO2 26 05/02/2014 0944   CO2 26 01/18/2014 0640   BUN 21.3 05/02/2014 0944   BUN 19 01/18/2014 0640   CREATININE 0.8 05/02/2014 0944   CREATININE 0.63 01/18/2014 0640   CREATININE 0.80 01/12/2014 1519      Component Value Date/Time   CALCIUM 9.1 05/02/2014 0944   CALCIUM 9.0 01/18/2014 0640   ALKPHOS 89 05/02/2014 0944   ALKPHOS 88 01/18/2014 0640   AST 25 05/02/2014 0944   AST 31 01/18/2014 0640     ALT 18 05/02/2014 0944   ALT 18 01/18/2014 0640   BILITOT 0.38 05/02/2014 0944   BILITOT 0.4 01/18/2014 0640       RADIOGRAPHIC STUDIES: Ct Chest W Contrast  05/01/2014   CLINICAL DATA:  Subsequent encounter for small cell carcinoma of right lung.  EXAM: CT CHEST WITH CONTRAST  TECHNIQUE: Multidetector CT imaging of the chest was performed during intravenous contrast administration.  CONTRAST:  37mL OMNIPAQUE IOHEXOL 300 MG/ML  SOLN  COMPARISON:  03/17/2014.  FINDINGS: Soft tissue / Mediastinum: The tip of the right Port-A-Cath is positioned at the junction of the SVC and RA.  No axillary lymphadenopathy. No mediastinal or hilar lymphadenopathy. Heart size is normal. Coronary artery calcification is noted. No pericardial effusion.  Lungs / Pleura: As  before, there is calcific stellate pleural-parenchymal opacity in the right apex, likely secondary scarring. Advanced changes of emphysema are evident bilaterally. Probable adherent mucus in the mid trachea.  The right lower lobe spiculated nodule is stable to minimally decreased in the interval measuring 5 x 11 mm today compared to 6 bilobed mm previously. Subsegmental atelectasis is seen in the posterior lingula and the dependent lower lobes.  Bones: Bone windows reveal no worrisome lytic or sclerotic osseous lesions.  Upper Abdomen: Tiny cysts are seen in the liver and kidneys, incompletely visualized  IMPRESSION: Stable to minimal decrease in the spiculated right lower lobe nodule. Otherwise stable exam.   Electronically Signed   By: Misty Stanley M.D.   On: 05/01/2014 15:29   Ir Fluoro Guide Cv Line Right  04/10/2014   CLINICAL DATA:  Lung cancer  EXAM: TUNNEL POWER PORT PLACEMENT WITH SUBCUTANEOUS POCKET UTILIZING ULTRASOUND & FLOUROSCOPY  FLUOROSCOPY TIME:  30 seconds.  MEDICATIONS AND MEDICAL HISTORY: Versed 2 mg, Fentanyl 100 mcg.  As antibiotic prophylaxis, Ancef was ordered pre-procedure and administered intravenously within one hour of  incision.  ANESTHESIA/SEDATION: Moderate sedation time: 30 minutes  CONTRAST:  None  PROCEDURE: After written informed consent was obtained, patient was placed in the supine position on angiographic table. The right neck and chest was prepped and draped in a sterile fashion. Lidocaine was utilized for local anesthesia. The right internal jugular vein was noted to be patent initially with ultrasound. Under sonographic guidance, a micropuncture needle was inserted into the right IJ vein (Ultrasound and fluoroscopic image documentation was performed). The needle was removed over an 018 wire which was exchanged for a Amplatz. This was advanced into the IVC. An 8-French dilator was advanced over the Amplatz.  A small incision was made in the right upper chest over the anterior right second rib. Utilizing blunt dissection, a subcutaneous pocket was created in the caudal direction. The pocket was irrigated with a copious amount of sterile normal saline. The port catheter was tunneled from the chest incision, and out the neck incision. The reservoir was inserted into the subcutaneous pocket and secured with two 3-0 Ethilon stitches. A peel-away sheath was advanced over the Amplatz wire. The port catheter was cut to measure length and inserted through the peel-away sheath. The peel-away sheath was removed. The chest incision was closed with 3-0 Vicryl interrupted stitches for the subcutaneous tissue and a running of 4-0 Vicryl subcuticular stitch for the skin. The neck incision was closed with a 4-0 Vicryl subcuticular stitch. Derma-bond was applied to both surgical incisions. The port reservoir was flushed and instilled with heparinized saline. No complications.  FINDINGS: A right IJ vein Port-A-Cath is in place with its tip at the cavoatrial junction.  COMPLICATIONS: None  IMPRESSION: Successful 8 French right internal jugular vein power port placement with its tip at the SVC/RA junction.   Electronically Signed   By: Maryclare Bean M.D.   On: 04/10/2014 16:34   Ir US Guide Vasc Access Right  04/10/2014   CLINICAL DATA:  Lung cancer  EXAM: TUNNEL POWER PORT PLACEMENT WITH SUBCUTANEOUS POCKET UTILIZING ULTRASOUND & FLOUROSCOPY  FLUOROSCOPY TIME:  30 seconds.  MEDICATIONS AND MEDICAL HISTORY: Versed 2 mg, Fentanyl 100 mcg.  As antibiotic prophylaxis, Ancef was ordered pre-procedure and administered intravenously within one hour of incision.  ANESTHESIA/SEDATION: Moderate sedation time: 30 minutes  CONTRAST:  None  PROCEDURE: After written informed consent was obtained, patient was placed in the supine position on angiographic table.  The right neck and chest was prepped and draped in a sterile fashion. Lidocaine was utilized for local anesthesia. The right internal jugular vein was noted to be patent initially with ultrasound. Under sonographic guidance, a micropuncture needle was inserted into the right IJ vein (Ultrasound and fluoroscopic image documentation was performed). The needle was removed over an 018 wire which was exchanged for a Amplatz. This was advanced into the IVC. An 8-French dilator was advanced over the Amplatz.  A small incision was made in the right upper chest over the anterior right second rib. Utilizing blunt dissection, a subcutaneous pocket was created in the caudal direction. The pocket was irrigated with a copious amount of sterile normal saline. The port catheter was tunneled from the chest incision, and out the neck incision. The reservoir was inserted into the subcutaneous pocket and secured with two 3-0 Ethilon stitches. A peel-away sheath was advanced over the Amplatz wire. The port catheter was cut to measure length and inserted through the peel-away sheath. The peel-away sheath was removed. The chest incision was closed with 3-0 Vicryl interrupted stitches for the subcutaneous tissue and a running of 4-0 Vicryl subcuticular stitch for the skin. The neck incision was closed with a 4-0 Vicryl subcuticular  stitch. Derma-bond was applied to both surgical incisions. The port reservoir was flushed and instilled with heparinized saline. No complications.  FINDINGS: A right IJ vein Port-A-Cath is in place with its tip at the cavoatrial junction.  COMPLICATIONS: None  IMPRESSION: Successful 8 French right internal jugular vein power port placement with its tip at the SVC/RA junction.   Electronically Signed   By: Maryclare Bean M.D.   On: 04/10/2014 16:34    ASSESSMENT AND PLAN: This is a very pleasant 80 years old white male recently diagnosed with limited stage small cell lung cancer and currently undergoing systemic chemotherapy with cisplatin and etoposide status post 4 cycles. He is tolerating his treatment fairly well with no significant adverse effects. The patient was discussed with and also seen by Dr. Julien Nordmann. He will proceed with cycle #5 today as scheduled. He will continue with weekly labs as scheduled. He will retrun in weeks for another symptom management visit prior to the start of cycle #6. We will plan a repeat restaging CT scan in January 2016. He was advised to seek immediate medicall evaluation if he developed worsening neck pain or any neurologic symptoms. Both the patient and his wife voiced understanding.  He was advised to call immediately if he has any concerning symptoms in the interval. The patient voices understanding of current disease status and treatment options and is in agreement with the current care plan.  All questions were answered. The patient knows to call the clinic with any problems, questions or concerns. We can certainly see the patient much sooner if necessary.  Carlton Adam, PA-C 05/02/2014  ADDENDUM: Hematology/Oncology Attending: I had a face to face encounter with the patient today. I recommended his care plan. This is a very pleasant 80 years old white male with limited stage small cell lung cancer currently undergoing systemic chemotherapy with cisplatin and  etoposide. He is status post 4 cycles and tolerating his treatment fairly well. He received a course of concurrent radiotherapy during the first 2 cycles of his treatment. The recent CT scan of the chest showed further improvement in his disease. The patient the option of discontinuing his treatment at this point versus proceeding with 2 more cycles of systemic chemotherapy with the same  regimen. He is tolerating his treatment well and would like to proceed with the last cycle of this regimen. After completion of the treatment I would refer the patient to radiation oncology for discussion of prophylactic cranial irradiation. He would come back for follow-up visit in 3 weeks with the next cycle of his treatment. He was advised to call immediately if he has any concerning symptoms in the interval.  Disclaimer: This note was dictated with voice recognition software. Similar sounding words can inadvertently be transcribed and may not be corrected upon review. Eilleen Kempf., MD 05/02/2014

## 2014-05-02 NOTE — Patient Instructions (Signed)
[facility] Discharge Instructions for Patients Receiving Chemotherapy  Today you received the following chemotherapy agents: Cisplatin and Etoposide.  To help prevent nausea and vomiting after your treatment, we encourage you to take your nausea medication as prescribed.   If you develop nausea and vomiting that is not controlled by your nausea medication, call the clinic.   BELOW ARE SYMPTOMS THAT SHOULD BE REPORTED IMMEDIATELY:  *FEVER GREATER THAN 100.5 F  *CHILLS WITH OR WITHOUT FEVER  NAUSEA AND VOMITING THAT IS NOT CONTROLLED WITH YOUR NAUSEA MEDICATION  *UNUSUAL SHORTNESS OF BREATH  *UNUSUAL BRUISING OR BLEEDING  TENDERNESS IN MOUTH AND THROAT WITH OR WITHOUT PRESENCE OF ULCERS  *URINARY PROBLEMS  *BOWEL PROBLEMS  UNUSUAL RASH Items with * indicate a potential emergency and should be followed up as soon as possible.  Feel free to call the clinic you have any questions or concerns. The clinic phone number is [phone].

## 2014-05-02 NOTE — Patient Instructions (Signed)
Continue labs and chemotherapy as scheduled Follow up in 3 weeks

## 2014-05-02 NOTE — Telephone Encounter (Signed)
Gave avs & cal forDec. Sent mess to sch tx. °

## 2014-05-02 NOTE — Progress Notes (Signed)
Patient reports appetite is good.   Weight has increased and was documented as 122.5 pounds December 7, which is increased from 121.9 pounds November 17. Patient has no nutrition issues today.  Nutrition diagnosis: Unintended weight loss improved.  Intervention:  Patient has been educated to increase calories and protein to promote weight maintenance. Patient to continue oral nutrition supplements 3 times a day. Provided patient with coupons. Questions were answered.  Teach back method used.  Monitoring, evaluation, goals: Patient will tolerate adequate calories and protein to minimize weight loss.  Next visit:Tuesday, December 29, during chemotherapy.  **Disclaimer: This note was dictated with voice recognition software. Similar sounding words can inadvertently be transcribed and this note may contain transcription errors which may not have been corrected upon publication of note.**

## 2014-05-02 NOTE — Assessment & Plan Note (Signed)
Patient received cycle 4 of his cisplatin/etoposide chemotherapy regimen on 04/11/2014.  Patient has plans to return on Wednesday, 05/03/2014 for cycle 5 of the same regimen.  Advised patient to make sure that he obtains no further lab draws or IV sticks to his arms due to chronic/recurrent phlebitis issues.  Advised patient to insist that his a Port-A-Cath be accessed in the future.

## 2014-05-02 NOTE — Telephone Encounter (Signed)
Per staff message and POF I have scheduled appts. Advised scheduler of appts. JMW  

## 2014-05-02 NOTE — Progress Notes (Signed)
will   SYMPTOM MANAGEMENT CLINIC   HPI: Patrick Carr 80 y.o. male diagnosed with lung cancer.  Currently undergoing cisplatin/etoposide chemotherapy regimen.  Patient called the cancer Center today requesting urgent care visit.  Patient is complaining of recurrent left arm phlebitis again.  He has been treated twice in the recent past for both left and right arm phlebitis following either her lab draw stick or peripheral IV insertion.  He has since obtained a Port-A-Cath to prevent any further phlebitis issues.  He was treated twice in the recent past with both Keflex and ibuprofen for any mild cellulitis and phlebitis.  Patient states that the phlebitis has essentially resolved from his 2 previous episodes; but he does continue with some residual corded knots at the previous phlebitis sites.  Patient states that he did obtain a lab draw peripherally to his left arm just last week; and has since developed new erythema, edema, and mild tenderness directly above the previous phlebitis site at his left antecubital space.  Patient denies any other new symptoms whatsoever.  He denies any fevers or chills.   HPI  CURRENT THERAPY: Upcoming Treatment Dates - LUNG SMALL CELL - LIMITED STAGE Cisplatin D1 / Etoposide D1-3 q21d Days with orders from any treatment category:  05/03/2014      SCHEDULING COMMUNICATION      dexamethasone (DECADRON) injection 10 mg      etoposide (VEPESID) 190 mg in sodium chloride 0.9 % 500 mL chemo infusion      sodium chloride 0.9 % injection 10 mL      heparin lock flush 100 unit/mL      heparin lock flush 100 unit/mL      alteplase (CATHFLO ACTIVASE) injection 2 mg      sodium chloride 0.9 % injection 3 mL      Hot Pack 1 packet      0.9 %  sodium chloride infusion 05/04/2014      SCHEDULING COMMUNICATION      dexamethasone (DECADRON) injection 10 mg      etoposide (VEPESID) 190 mg in sodium chloride 0.9 % 500 mL chemo infusion      sodium chloride 0.9 %  injection 10 mL      heparin lock flush 100 unit/mL      heparin lock flush 100 unit/mL      alteplase (CATHFLO ACTIVASE) injection 2 mg      sodium chloride 0.9 % injection 3 mL      Hot Pack 1 packet      0.9 %  sodium chloride infusion 05/05/2014      SCHEDULING COMMUNICATION INJECTION      pegfilgrastim (NEULASTA) injection 6 mg    ROS  Past Medical History  Diagnosis Date  . Hypertension   . Hyperlipidemia   . BPH (benign prostatic hyperplasia)   . Lesion of right lung     RLL  . RBBB     no palpations  . Renal cysts, acquired, bilateral   . History of kidney stones   . Hernia, inguinal left   . History of blood transfusion     artery in sinus bleed  . Phlebitis 03/20/14    bilateral forearms    Past Surgical History  Procedure Laterality Date  . Sinus artery surgery      not surgery  . No past surgeries    . Video bronchoscopy with endobronchial navigation N/A 01/18/2014    Procedure: VIDEO BRONCHOSCOPY WITH ENDOBRONCHIAL NAVIGATION;  Surgeon: Lilia Argue  Servando Snare, MD;  Location: [facility];  Service: Thoracic;  Laterality: N/A;  . Video bronchoscopy with endobronchial ultrasound N/A 01/18/2014    Procedure: VIDEO BRONCHOSCOPY WITH ENDOBRONCHIAL ULTRASOUND;  Surgeon: [name], MD;  Location: [facility];  Service: Thoracic;  Laterality: N/A;    has Lung nodule; Hypertension; Hyperlipidemia; BPH (benign prostatic hyperplasia); RBBB; Renal cysts, acquired, bilateral; Lesion of right lung; Small cell carcinoma of right lung; and Phlebitis on his problem list.     is allergic to other and pneumococcal vaccines.    Medication List       This list is accurate as of: 05/01/14 11:59 PM.  Always use your most recent med list.               alfuzosin 10 MG 24 hr tablet  Commonly known as:  UROXATRAL  Take 10 mg by mouth daily.     ALPRAZolam 0.25 MG tablet  Commonly known as:  XANAX  Take 0.25 mg by mouth 3 (three) times daily as needed for anxiety.     amLODipine  5 MG tablet  Commonly known as:  NORVASC  Take 5 mg by mouth daily.     aspirin 81 MG tablet  Take 81 mg by mouth daily.     atorvastatin 40 MG tablet  Commonly known as:  LIPITOR  Take 40 mg by mouth daily.     cephALEXin 500 MG capsule  Commonly known as:  KEFLEX  Take 1 capsule (500 mg total) by mouth 4 (four) times daily.     lidocaine-prilocaine cream  Commonly known as:  EMLA  Apply 1 application topically as needed. Apply over port site 1 hour prior to chemo. Do not rub in medicine.     losartan 100 MG tablet  Commonly known as:  COZAAR  Take 100 mg by mouth daily.     prochlorperazine 10 MG tablet  Commonly known as:  COMPAZINE  Take 1 tablet (10 mg total) by mouth every 6 (six) hours as needed for nausea or vomiting.     tamsulosin 0.4 MG Caps capsule  Commonly known as:  FLOMAX  Take 0.4 mg by mouth daily.         PHYSICAL EXAMINATION  Blood pressure 125/78, pulse 109, temperature 97.5 F (36.4 C), temperature source Oral, resp. rate 18, height 5\' 5"  (1.651 m), weight 122 lb 8 oz (55.566 kg).  Physical Exam  Constitutional: He is oriented to person, place, and time and well-developed, well-nourished, and in no distress.  HENT:  Head: Normocephalic and atraumatic.  Mouth/Throat: Oropharynx is clear and moist.  Eyes: Conjunctivae and EOM are normal. Pupils are equal, round, and reactive to light. Right eye exhibits no discharge. Left eye exhibits no discharge. No scleral icterus.  Neck: Normal range of motion. Neck supple. No JVD present. No tracheal deviation present. No thyromegaly present.  Cardiovascular: Normal rate, regular rhythm, normal heart sounds and intact distal pulses.   Pulmonary/Chest: Effort normal and breath sounds normal. No respiratory distress. He has no wheezes.  Abdominal: Soft. Bowel sounds are normal. He exhibits no distension. There is no tenderness. There is no rebound.  Musculoskeletal: Normal range of motion. He exhibits edema and  tenderness.  Mild eyrthema, edema, and tenderness to left antecubital space.  Also has corded knots from previous phlebitis sites to bilat forearms.   Lymphadenopathy:    He has no cervical adenopathy.  Neurological: He is alert and oriented to person, place, and time. Gait normal.  Skin: Skin is warm and dry. No rash noted. There is erythema.  See previous note regarding new onset recurrent phlebitis.   Psychiatric: Affect normal.  Nursing note and vitals reviewed.   LABORATORY DATA:. No visits with results within 3 Day(s) from this visit. Latest known visit with results is:  Appointment on 04/25/2014  Component Date Value Ref Range Status  . WBC 04/25/2014 10.3  4.0 - 10.3 10e3/uL Final  . NEUT# 04/25/2014 8.8* 1.5 - 6.5 10e3/uL Final  . HGB 04/25/2014 10.2* 13.0 - 17.1 g/dL Final  . HCT 04/25/2014 31.3* 38.4 - 49.9 % Final  . Platelets 04/25/2014 93* 140 - 400 10e3/uL Final  . MCV 04/25/2014 105.6* 79.3 - 98.0 fL Final  . MCH 04/25/2014 34.5* 27.2 - 33.4 pg Final  . MCHC 04/25/2014 32.7  32.0 - 36.0 g/dL Final  . RBC 04/25/2014 2.96* 4.20 - 5.82 10e6/uL Final  . RDW 04/25/2014 20.8* 11.0 - 14.6 % Final  . lymph# 04/25/2014 0.5* 0.9 - 3.3 10e3/uL Final  . MONO# 04/25/2014 0.9  0.1 - 0.9 10e3/uL Final  . Eosinophils Absolute 04/25/2014 0.1  0.0 - 0.5 10e3/uL Final  . Basophils Absolute 04/25/2014 0.0  0.0 - 0.1 10e3/uL Final  . NEUT% 04/25/2014 85.0* 39.0 - 75.0 % Final  . LYMPH% 04/25/2014 4.7* 14.0 - 49.0 % Final  . MONO% 04/25/2014 9.1  0.0 - 14.0 % Final  . EOS% 04/25/2014 1.0  0.0 - 7.0 % Final  . BASO% 04/25/2014 0.2  0.0 - 2.0 % Final  . Sodium 04/25/2014 142  136 - 145 mEq/L Final  . Potassium 04/25/2014 3.9  3.5 - 5.1 mEq/L Final  . Chloride 04/25/2014 104  98 - 109 mEq/L Final  . CO2 04/25/2014 29  22 - 29 mEq/L Final  . Glucose 04/25/2014 120  70 - 140 mg/dl Final  . BUN 04/25/2014 18.5  7.0 - 26.0 mg/dL Final  . Creatinine 04/25/2014 0.8  0.7 - 1.3 mg/dL Final    . Total Bilirubin 04/25/2014 0.38  0.20 - 1.20 mg/dL Final  . Alkaline Phosphatase 04/25/2014 118  40 - 150 U/L Final  . AST 04/25/2014 20  5 - 34 U/L Final  . ALT 04/25/2014 16  0 - 55 U/L Final  . Total Protein 04/25/2014 6.3* 6.4 - 8.3 g/dL Final  . Albumin 04/25/2014 3.6  3.5 - 5.0 g/dL Final  . Calcium 04/25/2014 9.1  8.4 - 10.4 mg/dL Final  . Anion Gap 04/25/2014 10  3 - 11 mEq/L Final  . Magnesium 04/25/2014 2.1  1.5 - 2.5 mg/dl Final     RADIOGRAPHIC STUDIES: Ct Chest W Contrast  05/01/2014   CLINICAL DATA:  Subsequent encounter for small cell carcinoma of right lung.  EXAM: CT CHEST WITH CONTRAST  TECHNIQUE: Multidetector CT imaging of the chest was performed during intravenous contrast administration.  CONTRAST:  40mL OMNIPAQUE IOHEXOL 300 MG/ML  SOLN  COMPARISON:  03/17/2014.  FINDINGS: Soft tissue / Mediastinum: The tip of the right Port-A-Cath is positioned at the junction of the SVC and RA.  No axillary lymphadenopathy. No mediastinal or hilar lymphadenopathy. Heart size is normal. Coronary artery calcification is noted. No pericardial effusion.  Lungs / Pleura: As before, there is calcific stellate pleural-parenchymal opacity in the right apex, likely secondary scarring. Advanced changes of emphysema are evident bilaterally. Probable adherent mucus in the mid trachea.  The right lower lobe spiculated nodule is stable to minimally decreased in the interval measuring 5 x 11 mm  today compared to 6 bilobed mm previously. Subsegmental atelectasis is seen in the posterior lingula and the dependent lower lobes.  Bones: Bone windows reveal no worrisome lytic or sclerotic osseous lesions.  Upper Abdomen: Tiny cysts are seen in the liver and kidneys, incompletely visualized  IMPRESSION: Stable to minimal decrease in the spiculated right lower lobe nodule. Otherwise stable exam.   Electronically Signed   By: Misty Stanley M.D.   On: 05/01/2014 15:29    ASSESSMENT/PLAN:    Phlebitis Patient  has been treated for both left and right arm phlebitis after either lab draws or IV fluid administration to bilateral arms within this past month or so.  Patient obtained a Port-A-Cath due to recurrent phlebitis issues.  Patient reports that he obtain a lab draw to his left forearm just last week; and now presents to the [facility] again with the same symptoms of erythema, mild edema, and mild tenderness to the left antecubital region.  This area of phlebitis is directly above the previous phlebitis area.  Both the left and the right forearm previous phlebitis areas have palpable, corded knots.  Patient will once again be prescribed Keflex to take in case there is any mild cellulitis to the site.  Patient was also instructed to continue with ibuprofen on appearing basis; as well as cold compresses.  Advised both patient and his wife that these areas of phlebitis; and residual palpable corded knots may take several months to resolve completely.  Patient and his wife were advised to call or go directed to the emergency department if his develop any worsening symptoms whatsoever.  Small cell carcinoma of right lung Patient received cycle 4 of his cisplatin/etoposide chemotherapy regimen on 04/11/2014.  Patient has plans to return on Wednesday, 05/03/2014 for cycle 5 of the same regimen.  Advised patient to make sure that he obtains no further lab draws or IV sticks to his arms due to chronic/recurrent phlebitis issues.  Advised patient to insist that his a Port-A-Cath be accessed in the future.  Patient stated understanding of all instructions; and was in agreement with this plan of care. The patient knows to call the clinic with any problems, questions or concerns.   Review/collaboration with Dr. Julien Nordmann regarding all aspects of patient's visit today.   Total time spent with patient was 15 minutes;  with greater than 75 percent of that time spent in face to face counseling regarding his symptoms,  and  coordination of care and follow up.  Disclaimer: This note was dictated with voice recognition software. Similar sounding words can inadvertently be transcribed and may not be corrected upon review.   Drue Second, NP 05/02/2014

## 2014-05-03 ENCOUNTER — Ambulatory Visit (HOSPITAL_BASED_OUTPATIENT_CLINIC_OR_DEPARTMENT_OTHER): Payer: Medicare Other

## 2014-05-03 DIAGNOSIS — C7A1 Malignant poorly differentiated neuroendocrine tumors: Secondary | ICD-10-CM

## 2014-05-03 DIAGNOSIS — Z5111 Encounter for antineoplastic chemotherapy: Secondary | ICD-10-CM

## 2014-05-03 DIAGNOSIS — C3491 Malignant neoplasm of unspecified part of right bronchus or lung: Secondary | ICD-10-CM

## 2014-05-03 MED ORDER — DEXAMETHASONE SODIUM PHOSPHATE 10 MG/ML IJ SOLN
INTRAMUSCULAR | Status: AC
  Filled 2014-05-03: qty 1

## 2014-05-03 MED ORDER — SODIUM CHLORIDE 0.9 % IV SOLN
120.0000 mg/m2 | Freq: Once | INTRAVENOUS | Status: AC
  Administered 2014-05-03: 190 mg via INTRAVENOUS
  Filled 2014-05-03: qty 9.5

## 2014-05-03 MED ORDER — DEXAMETHASONE SODIUM PHOSPHATE 10 MG/ML IJ SOLN
10.0000 mg | Freq: Once | INTRAMUSCULAR | Status: AC
  Administered 2014-05-03: 10 mg via INTRAVENOUS

## 2014-05-03 MED ORDER — SODIUM CHLORIDE 0.9 % IJ SOLN
10.0000 mL | INTRAMUSCULAR | Status: DC | PRN
  Administered 2014-05-03: 10 mL
  Filled 2014-05-03: qty 10

## 2014-05-03 MED ORDER — SODIUM CHLORIDE 0.9 % IV SOLN
Freq: Once | INTRAVENOUS | Status: AC
  Administered 2014-05-03: 13:00:00 via INTRAVENOUS

## 2014-05-03 MED ORDER — HEPARIN SOD (PORK) LOCK FLUSH 100 UNIT/ML IV SOLN
500.0000 [IU] | Freq: Once | INTRAVENOUS | Status: AC | PRN
  Administered 2014-05-03: 500 [IU]
  Filled 2014-05-03: qty 5

## 2014-05-04 ENCOUNTER — Ambulatory Visit
Admission: RE | Admit: 2014-05-04 | Discharge: 2014-05-04 | Disposition: A | Discharge status: Home / Self Care | Payer: Medicare Other | Source: Ambulatory Visit | Attending: Radiation Oncology | Admitting: Radiation Oncology

## 2014-05-04 ENCOUNTER — Ambulatory Visit (HOSPITAL_BASED_OUTPATIENT_CLINIC_OR_DEPARTMENT_OTHER): Payer: Medicare Other

## 2014-05-04 VITALS — BP 152/69 | HR 82 | Temp 97.6°F | Resp 20 | Wt 132.3 lb

## 2014-05-04 DIAGNOSIS — C3491 Malignant neoplasm of unspecified part of right bronchus or lung: Secondary | ICD-10-CM

## 2014-05-04 DIAGNOSIS — C7A1 Malignant poorly differentiated neuroendocrine tumors: Secondary | ICD-10-CM

## 2014-05-04 DIAGNOSIS — Z5111 Encounter for antineoplastic chemotherapy: Secondary | ICD-10-CM

## 2014-05-04 MED ORDER — HEPARIN SOD (PORK) LOCK FLUSH 100 UNIT/ML IV SOLN
500.0000 [IU] | Freq: Once | INTRAVENOUS | Status: AC | PRN
  Administered 2014-05-04: 500 [IU]
  Filled 2014-05-04: qty 5

## 2014-05-04 MED ORDER — DEXAMETHASONE SODIUM PHOSPHATE 10 MG/ML IJ SOLN
10.0000 mg | Freq: Once | INTRAMUSCULAR | Status: AC
  Administered 2014-05-04: 10 mg via INTRAVENOUS

## 2014-05-04 MED ORDER — SODIUM CHLORIDE 0.9 % IV SOLN
120.0000 mg/m2 | Freq: Once | INTRAVENOUS | Status: AC
  Administered 2014-05-04: 190 mg via INTRAVENOUS
  Filled 2014-05-04: qty 9.5

## 2014-05-04 MED ORDER — SODIUM CHLORIDE 0.9 % IJ SOLN
10.0000 mL | INTRAMUSCULAR | Status: DC | PRN
  Administered 2014-05-04: 10 mL
  Filled 2014-05-04: qty 10

## 2014-05-04 MED ORDER — DEXAMETHASONE SODIUM PHOSPHATE 10 MG/ML IJ SOLN
INTRAMUSCULAR | Status: AC
  Filled 2014-05-04: qty 1

## 2014-05-04 MED ORDER — SODIUM CHLORIDE 0.9 % IV SOLN
Freq: Once | INTRAVENOUS | Status: AC
  Administered 2014-05-04: 12:00:00 via INTRAVENOUS

## 2014-05-04 NOTE — Patient Instructions (Signed)
[facility] Discharge Instructions for Patients Receiving Chemotherapy  Today you received the following chemotherapy agents Etoposide.  To help prevent nausea and vomiting after your treatment, we encourage you to take your nausea medication.   If you develop nausea and vomiting that is not controlled by your nausea medication, call the clinic.   BELOW ARE SYMPTOMS THAT SHOULD BE REPORTED IMMEDIATELY:  *FEVER GREATER THAN 100.5 F  *CHILLS WITH OR WITHOUT FEVER  NAUSEA AND VOMITING THAT IS NOT CONTROLLED WITH YOUR NAUSEA MEDICATION  *UNUSUAL SHORTNESS OF BREATH  *UNUSUAL BRUISING OR BLEEDING  TENDERNESS IN MOUTH AND THROAT WITH OR WITHOUT PRESENCE OF ULCERS  *URINARY PROBLEMS  *BOWEL PROBLEMS  UNUSUAL RASH Items with * indicate a potential emergency and should be followed up as soon as possible.  Feel free to call the clinic you have any questions or concerns. The clinic phone number is [phone].

## 2014-05-05 ENCOUNTER — Ambulatory Visit (HOSPITAL_BASED_OUTPATIENT_CLINIC_OR_DEPARTMENT_OTHER): Payer: Medicare Other

## 2014-05-05 DIAGNOSIS — C7A1 Malignant poorly differentiated neuroendocrine tumors: Secondary | ICD-10-CM

## 2014-05-05 DIAGNOSIS — Z5189 Encounter for other specified aftercare: Secondary | ICD-10-CM

## 2014-05-05 DIAGNOSIS — C3491 Malignant neoplasm of unspecified part of right bronchus or lung: Secondary | ICD-10-CM

## 2014-05-05 MED ORDER — PEGFILGRASTIM INJECTION 6 MG/0.6ML ~~LOC~~
6.0000 mg | PREFILLED_SYRINGE | Freq: Once | SUBCUTANEOUS | Status: AC
  Administered 2014-05-05: 6 mg via SUBCUTANEOUS
  Filled 2014-05-05: qty 0.6

## 2014-05-05 NOTE — Progress Notes (Signed)
   Department of Radiation Oncology  Phone:  [phone] Fax:        [phone]   Name: Patrick Carr MRN: [id number]  DOB: 1933-10-14  Date: 05/04/2014  Follow Up Visit Note  Diagnosis: Small cell carcinoma of right lung   Staging form: Lung, AJCC 7th Edition     Clinical: Stage IIA (T1a, N1, M0) - Signed by [name], MD on 01/26/2014     Pathologic: No stage assigned - Unsigned  Summary and Interval since last radiation: 1 month from 60 Gy in 30 fractions completed 03/21/14  Interval History: Patrick Carr presents today for routine followup.  He is feeling well. His appetite and enrgy levels are better. He contimues on chemotherapy. He has been treated for phlebitis of his bilateral arms.  He had a CT of the chest on 12/7 which showed an excellent response in the mediastinum but minimal response in the right lower lobe nodule.   Physical Exam:  Filed Vitals:   05/04/14 1448  BP: 152/69  Pulse: 82  Temp: 97.6 F (36.4 C)  Resp: 20  Weight: 132 lb 4.8 oz (60.011 kg)  SpO2: 98%   Alopecia. Alert and oriented. No distress.   IMPRESSION: Patrick Carr is a 80 y.o. male s/p chemoradiation for small clell lung cancer with good response to chemoradiation  PLAN:  He is doing well. We discussed the need for further chemotherapy and subsequent follow up every 4-6 months which he has scheduled.  We discussed the pros and cons of PCI in patients with small cell lung cancer. I hesitate somewhat as he is older and sometimes older patients have more memory difficulties even with lower doses of RT. I will plan to see him back after his chemo is complete.   Thea Silversmith, MD

## 2014-05-05 NOTE — Patient Instructions (Signed)
Pegfilgrastim injection What is this medicine? PEGFILGRASTIM (peg fil GRA stim) is a long-acting granulocyte colony-stimulating factor that stimulates the growth of neutrophils, a type of white blood cell important in the body's fight against infection. It is used to reduce the incidence of fever and infection in patients with certain types of cancer who are receiving chemotherapy that affects the bone marrow. This medicine may be used for other purposes; ask your health care provider or pharmacist if you have questions. COMMON BRAND NAME(S): Neulasta What should I tell my health care provider before I take this medicine? They need to know if you have any of these conditions: -latex allergy -ongoing radiation therapy -sickle cell disease -skin reactions to acrylic adhesives (On-Body Injector only) -an unusual or allergic reaction to pegfilgrastim, filgrastim, other medicines, foods, dyes, or preservatives -pregnant or trying to get pregnant -breast-feeding How should I use this medicine? This medicine is for injection under the skin. If you get this medicine at home, you will be taught how to prepare and give the pre-filled syringe or how to use the On-body Injector. Refer to the patient Instructions for Use for detailed instructions. Use exactly as directed. Take your medicine at regular intervals. Do not take your medicine more often than directed. It is important that you put your used needles and syringes in a special sharps container. Do not put them in a trash can. If you do not have a sharps container, call your pharmacist or healthcare provider to get one. Talk to your pediatrician regarding the use of this medicine in children. Special care may be needed. Overdosage: If you think you have taken too much of this medicine contact a poison control center or emergency room at once. NOTE: This medicine is only for you. Do not share this medicine with others. What if I miss a dose? It is  important not to miss your dose. Call your doctor or health care professional if you miss your dose. If you miss a dose due to an On-body Injector failure or leakage, a new dose should be administered as soon as possible using a single prefilled syringe for manual use. What may interact with this medicine? Interactions have not been studied. Give your health care provider a list of all the medicines, herbs, non-prescription drugs, or dietary supplements you use. Also tell them if you smoke, drink alcohol, or use illegal drugs. Some items may interact with your medicine. This list may not describe all possible interactions. Give your health care provider a list of all the medicines, herbs, non-prescription drugs, or dietary supplements you use. Also tell them if you smoke, drink alcohol, or use illegal drugs. Some items may interact with your medicine. What should I watch for while using this medicine? You may need blood work done while you are taking this medicine. If you are going to need a MRI, CT scan, or other procedure, tell your doctor that you are using this medicine (On-Body Injector only). What side effects may I notice from receiving this medicine? Side effects that you should report to your doctor or health care professional as soon as possible: -allergic reactions like skin rash, itching or hives, swelling of the face, lips, or tongue -dizziness -fever -pain, redness, or irritation at site where injected -pinpoint red spots on the skin -shortness of breath or breathing problems -stomach or side pain, or pain at the shoulder -swelling -tiredness -trouble passing urine Side effects that usually do not require medical attention (report to your doctor   or health care professional if they continue or are bothersome): -bone pain -muscle pain This list may not describe all possible side effects. Call your doctor for medical advice about side effects. You may report side effects to FDA at  1-800-FDA-1088. Where should I keep my medicine? Keep out of the reach of children. Store pre-filled syringes in a refrigerator between 2 and 8 degrees C (36 and 46 degrees F). Do not freeze. Keep in carton to protect from light. Throw away this medicine if it is left out of the refrigerator for more than 48 hours. Throw away any unused medicine after the expiration date. NOTE: This sheet is a summary. It may not cover all possible information. If you have questions about this medicine, talk to your doctor, pharmacist, or health care provider.  2015, [employer] Standard. (2013-08-11 16:14:05)  

## 2014-05-16 ENCOUNTER — Telehealth: Payer: Self-pay | Admitting: *Deleted

## 2014-05-16 NOTE — Telephone Encounter (Signed)
Pt's daughter called stating that pt was around his other daughter who just had major surgery at [facility].  The next day after he spent time with her it was found that she had hospital acquired PNA.  Pt was worried about getting PNA.  Currently asymptomatic.  No fever, chills, cough.  Advised he monitor his temp closely, call if he develops any fever and practice good hand washing.  She will inform patient.

## 2014-05-23 ENCOUNTER — Ambulatory Visit: Payer: Medicare Other | Admitting: Nutrition

## 2014-05-23 ENCOUNTER — Ambulatory Visit: Payer: Medicare Other

## 2014-05-23 ENCOUNTER — Other Ambulatory Visit (HOSPITAL_BASED_OUTPATIENT_CLINIC_OR_DEPARTMENT_OTHER): Payer: Medicare Other

## 2014-05-23 ENCOUNTER — Telehealth: Payer: Self-pay | Admitting: Internal Medicine

## 2014-05-23 ENCOUNTER — Telehealth: Payer: Self-pay | Admitting: *Deleted

## 2014-05-23 ENCOUNTER — Ambulatory Visit (HOSPITAL_BASED_OUTPATIENT_CLINIC_OR_DEPARTMENT_OTHER): Payer: Medicare Other | Admitting: Physician Assistant

## 2014-05-23 ENCOUNTER — Encounter: Payer: Self-pay | Admitting: Physician Assistant

## 2014-05-23 ENCOUNTER — Ambulatory Visit (HOSPITAL_BASED_OUTPATIENT_CLINIC_OR_DEPARTMENT_OTHER): Payer: Medicare Other

## 2014-05-23 VITALS — BP 121/70 | HR 116 | Temp 97.7°F | Resp 18 | Ht 65.0 in | Wt 123.3 lb

## 2014-05-23 DIAGNOSIS — C3491 Malignant neoplasm of unspecified part of right bronchus or lung: Secondary | ICD-10-CM

## 2014-05-23 DIAGNOSIS — C7A1 Malignant poorly differentiated neuroendocrine tumors: Secondary | ICD-10-CM

## 2014-05-23 DIAGNOSIS — Z5111 Encounter for antineoplastic chemotherapy: Secondary | ICD-10-CM

## 2014-05-23 LAB — CBC WITH DIFFERENTIAL/PLATELET
BASO%: 0.2 % (ref 0.0–2.0)
Basophils Absolute: 0 10*3/uL (ref 0.0–0.1)
EOS%: 0.8 % (ref 0.0–7.0)
Eosinophils Absolute: 0.1 10*3/uL (ref 0.0–0.5)
HCT: 34.1 % — ABNORMAL LOW (ref 38.4–49.9)
HEMOGLOBIN: 11 g/dL — AB (ref 13.0–17.1)
LYMPH%: 3.2 % — ABNORMAL LOW (ref 14.0–49.0)
MCH: 35 pg — AB (ref 27.2–33.4)
MCHC: 32.3 g/dL (ref 32.0–36.0)
MCV: 108.3 fL — ABNORMAL HIGH (ref 79.3–98.0)
MONO#: 0.9 10*3/uL (ref 0.1–0.9)
MONO%: 5.7 % (ref 0.0–14.0)
NEUT#: 14.7 10*3/uL — ABNORMAL HIGH (ref 1.5–6.5)
NEUT%: 90.1 % — ABNORMAL HIGH (ref 39.0–75.0)
Platelets: 192 10*3/uL (ref 140–400)
RBC: 3.15 10*6/uL — AB (ref 4.20–5.82)
RDW: 18.1 % — AB (ref 11.0–14.6)
WBC: 16.4 10*3/uL — ABNORMAL HIGH (ref 4.0–10.3)
lymph#: 0.5 10*3/uL — ABNORMAL LOW (ref 0.9–3.3)

## 2014-05-23 LAB — COMPREHENSIVE METABOLIC PANEL (CC13)
ALBUMIN: 3.8 g/dL (ref 3.5–5.0)
ALT: 14 U/L (ref 0–55)
ANION GAP: 9 meq/L (ref 3–11)
AST: 22 U/L (ref 5–34)
Alkaline Phosphatase: 103 U/L (ref 40–150)
BUN: 14 mg/dL (ref 7.0–26.0)
CO2: 30 meq/L — AB (ref 22–29)
CREATININE: 0.9 mg/dL (ref 0.7–1.3)
Calcium: 9.4 mg/dL (ref 8.4–10.4)
Chloride: 102 mEq/L (ref 98–109)
EGFR: 82 mL/min/{1.73_m2} — AB (ref >90)
Glucose: 159 mg/dl — ABNORMAL HIGH (ref 70–140)
Potassium: 3.8 mEq/L (ref 3.5–5.1)
SODIUM: 141 meq/L (ref 136–145)
TOTAL PROTEIN: 6.4 g/dL (ref 6.4–8.3)
Total Bilirubin: 0.35 mg/dL (ref 0.20–1.20)

## 2014-05-23 LAB — MAGNESIUM (CC13): Magnesium: 1.9 mg/dl (ref 1.5–2.5)

## 2014-05-23 MED ORDER — SODIUM CHLORIDE 0.9 % IJ SOLN
10.0000 mL | INTRAMUSCULAR | Status: DC | PRN
  Filled 2014-05-23: qty 10

## 2014-05-23 MED ORDER — DEXAMETHASONE SODIUM PHOSPHATE 20 MG/5ML IJ SOLN
INTRAMUSCULAR | Status: AC
  Filled 2014-05-23: qty 5

## 2014-05-23 MED ORDER — PALONOSETRON HCL INJECTION 0.25 MG/5ML
INTRAVENOUS | Status: AC
Start: 2014-05-23 — End: 2014-05-23
  Filled 2014-05-23: qty 5

## 2014-05-23 MED ORDER — SODIUM CHLORIDE 0.9 % IV SOLN
60.0000 mg/m2 | Freq: Once | INTRAVENOUS | Status: AC
  Administered 2014-05-23: 94 mg via INTRAVENOUS
  Filled 2014-05-23: qty 94

## 2014-05-23 MED ORDER — HEPARIN SOD (PORK) LOCK FLUSH 100 UNIT/ML IV SOLN
500.0000 [IU] | Freq: Once | INTRAVENOUS | Status: AC
  Administered 2014-05-23: 500 [IU] via INTRAVENOUS
  Filled 2014-05-23: qty 5

## 2014-05-23 MED ORDER — FOSAPREPITANT DIMEGLUMINE INJECTION 150 MG
150.0000 mg | Freq: Once | INTRAVENOUS | Status: AC
  Administered 2014-05-23: 150 mg via INTRAVENOUS
  Filled 2014-05-23: qty 5

## 2014-05-23 MED ORDER — LIDOCAINE-PRILOCAINE 2.5-2.5 % EX CREA
TOPICAL_CREAM | CUTANEOUS | Status: AC
  Filled 2014-05-23: qty 5

## 2014-05-23 MED ORDER — DEXAMETHASONE SODIUM PHOSPHATE 20 MG/5ML IJ SOLN
12.0000 mg | Freq: Once | INTRAMUSCULAR | Status: AC
  Administered 2014-05-23: 12 mg via INTRAVENOUS

## 2014-05-23 MED ORDER — POTASSIUM CHLORIDE 2 MEQ/ML IV SOLN
Freq: Once | INTRAVENOUS | Status: AC
  Administered 2014-05-23: 11:00:00 via INTRAVENOUS
  Filled 2014-05-23: qty 10

## 2014-05-23 MED ORDER — SODIUM CHLORIDE 0.9 % IV SOLN
Freq: Once | INTRAVENOUS | Status: AC
  Administered 2014-05-23: 14:00:00 via INTRAVENOUS

## 2014-05-23 MED ORDER — PALONOSETRON HCL INJECTION 0.25 MG/5ML
0.2500 mg | Freq: Once | INTRAVENOUS | Status: AC
  Administered 2014-05-23: 0.25 mg via INTRAVENOUS

## 2014-05-23 MED ORDER — SODIUM CHLORIDE 0.9 % IV SOLN
120.0000 mg/m2 | Freq: Once | INTRAVENOUS | Status: AC
  Administered 2014-05-23: 190 mg via INTRAVENOUS
  Filled 2014-05-23: qty 9.5

## 2014-05-23 MED ORDER — SODIUM CHLORIDE 0.9 % IJ SOLN
10.0000 mL | INTRAMUSCULAR | Status: AC | PRN
  Administered 2014-05-23 (×2): 10 mL via INTRAVENOUS
  Filled 2014-05-23: qty 10

## 2014-05-23 MED ORDER — PALONOSETRON HCL INJECTION 0.25 MG/5ML
INTRAVENOUS | Status: AC
  Filled 2014-05-23: qty 5

## 2014-05-23 NOTE — Progress Notes (Addendum)
[facility] [phone]   [phone]  OFFICE PROGRESS NOTE  [name], MD [address] [place] [address]  DIAGNOSIS: Limited stage small cell lung cancer, stage IIA (T1a, N1, M0) presented with right lower lobe nodule in addition to right hilar lymphadenopathy diagnosed in August of 2015.  PRIOR THERAPY: None.  CURRENT THERAPY: Systemic chemotherapy with cisplatin 60 mg/M2 on day 1 and etoposide at 120 mg/M2 on days 1, 2 and 3 with Neulasta support on day 4. Status post 5 cycles. He also received a course of concurrent radiotherapy under the care of Dr. [name].  INTERVAL HISTORY: Patrick Carr 80 y.o. male returns to the clinic today for followup visit accompanied by his wife. The patient is tolerating his systemic chemotherapy with cisplatin and etoposide fairly well with no significant adverse effects. He denied having any fever or chills. He has no nausea or vomiting. He denied having any significant chest pain, shortness of breath, cough or hemoptysis. He has no significant weight loss or night sweats. He continues to complain of some right hip pain as well as some pain in his upper back and neck area. His upper back and neck pain was present prior to the initiation of chemotherapy. He denies any numbness or tingling into the neck or extremities.  He is here today to start cycle #6 of his chemotherapy.  MEDICAL HISTORY: Past Medical History  Diagnosis Date  . Hypertension   . Hyperlipidemia   . BPH (benign prostatic hyperplasia)   . Lesion of right lung     RLL  . RBBB     no palpations  . Renal cysts, acquired, bilateral   . History of kidney stones   . Hernia, inguinal left   . History of blood transfusion     artery in sinus bleed  . Phlebitis 03/20/14    bilateral forearms  . Radiation 02/08/14-03/21/14    Stage III lung cancer 30 fractions    ALLERGIES:  is allergic to other and pneumococcal  vaccines.  MEDICATIONS:  Current Outpatient Prescriptions  Medication Sig Dispense Refill  . alfuzosin (UROXATRAL) 10 MG 24 hr tablet Take 10 mg by mouth daily.    [name] ALPRAZolam (XANAX) 0.25 MG tablet Take 0.25 mg by mouth 3 (three) times daily as needed for anxiety.    [name] aspirin 81 MG tablet Take 81 mg by mouth daily.    [name] atorvastatin (LIPITOR) 40 MG tablet Take 40 mg by mouth daily.    . cephALEXin (KEFLEX) 500 MG capsule Take 1 capsule (500 mg total) by mouth 4 (four) times daily. 40 capsule 0  . lidocaine-prilocaine (EMLA) cream Apply 1 application topically as needed. Apply over port site 1 hour prior to chemo. Do not rub in medicine. 30 g 0  . losartan (COZAAR) 100 MG tablet Take 100 mg by mouth daily.    . tamsulosin (FLOMAX) 0.4 MG CAPS capsule Take 0.4 mg by mouth daily.    [name] amLODipine (NORVASC) 5 MG tablet Take 5 mg by mouth daily.    . prochlorperazine (COMPAZINE) 10 MG tablet Take 1 tablet (10 mg total) by mouth every 6 (six) hours as needed for nausea or vomiting. (Patient not taking: Reported on 05/23/2014) 30 tablet 0   No current facility-administered medications for this visit.   Facility-Administered Medications Ordered in Other Visits  Medication Dose Route Frequency Provider Last Rate Last Dose  . sodium chloride 0.9 % injection 10  mL  10 mL Intravenous PRN [name], MD   10 mL at 05/23/14 1635  . sodium chloride 0.9 % injection 10 mL  10 mL Intravenous PRN [name], MD        SURGICAL HISTORY:  Past Surgical History  Procedure Laterality Date  . Sinus artery surgery      not surgery  . No past surgeries    . Video bronchoscopy with endobronchial navigation N/A 01/18/2014    Procedure: VIDEO BRONCHOSCOPY WITH ENDOBRONCHIAL NAVIGATION;  Surgeon: [name], MD;  Location: [facility];  Service: Thoracic;  Laterality: N/A;  . Video bronchoscopy with endobronchial ultrasound N/A 01/18/2014    Procedure: VIDEO BRONCHOSCOPY WITH ENDOBRONCHIAL ULTRASOUND;   Surgeon: [name], MD;  Location: [facility];  Service: Thoracic;  Laterality: N/A;    REVIEW OF SYSTEMS:  A comprehensive review of systems was negative except for: Musculoskeletal: positive for arthralgias and bone pain   PHYSICAL EXAMINATION: General appearance: alert, cooperative and no distress Head: Normocephalic, without obvious abnormality, atraumatic Neck: no adenopathy, no JVD, supple, symmetrical, trachea midline and thyroid not enlarged, symmetric, no tenderness/mass/nodules Lymph nodes: Cervical, supraclavicular, and axillary nodes normal. Resp: clear to auscultation bilaterally Back: symmetric, no curvature. ROM normal. No CVA tenderness. Cardio: regular rate and rhythm, S1, S2 normal, no murmur, click, rub or gallop GI: soft, non-tender; bowel sounds normal; no masses,  no organomegaly Extremities: extremities normal, atraumatic, no cyanosis or edema  ECOG PERFORMANCE STATUS: 1 - Symptomatic but completely ambulatory  Blood pressure 121/70, pulse 116, temperature 97.7 F (36.5 C), temperature source Oral, resp. rate 18, height 5\' 5"  (1.651 m), weight 123 lb 4.8 oz (55.929 kg), SpO2 96 %.  LABORATORY DATA: Lab Results  Component Value Date   WBC 16.4* 05/23/2014   HGB 11.0* 05/23/2014   HCT 34.1* 05/23/2014   MCV 108.3* 05/23/2014   PLT 192 05/23/2014      Chemistry      Component Value Date/Time   NA 141 05/23/2014 0907   NA 138 01/18/2014 0640   K 3.8 05/23/2014 0907   K 4.5 01/18/2014 0640   CL 99 01/18/2014 0640   CO2 30* 05/23/2014 0907   CO2 26 01/18/2014 0640   BUN 14.0 05/23/2014 0907   BUN 19 01/18/2014 0640   CREATININE 0.9 05/23/2014 0907   CREATININE 0.63 01/18/2014 0640   CREATININE 0.80 01/12/2014 1519      Component Value Date/Time   CALCIUM 9.4 05/23/2014 0907   CALCIUM 9.0 01/18/2014 0640   ALKPHOS 103 05/23/2014 0907   ALKPHOS 88 01/18/2014 0640   AST 22 05/23/2014 0907   AST 31 01/18/2014 0640   ALT 14 05/23/2014 0907   ALT 18  01/18/2014 0640   BILITOT 0.35 05/23/2014 0907   BILITOT 0.4 01/18/2014 0640       RADIOGRAPHIC STUDIES: Ct Chest W Contrast  05/01/2014   CLINICAL DATA:  Subsequent encounter for small cell carcinoma of right lung.  EXAM: CT CHEST WITH CONTRAST  TECHNIQUE: Multidetector CT imaging of the chest was performed during intravenous contrast administration.  CONTRAST:  42mL OMNIPAQUE IOHEXOL 300 MG/ML  SOLN  COMPARISON:  03/17/2014.  FINDINGS: Soft tissue / Mediastinum: The tip of the right Port-A-Cath is positioned at the junction of the SVC and RA.  No axillary lymphadenopathy. No mediastinal or hilar lymphadenopathy. Heart size is normal. Coronary artery calcification is noted. No pericardial effusion.  Lungs / Pleura: As before, there is calcific stellate pleural-parenchymal opacity in the right  apex, likely secondary scarring. Advanced changes of emphysema are evident bilaterally. Probable adherent mucus in the mid trachea.  The right lower lobe spiculated nodule is stable to minimally decreased in the interval measuring 5 x 11 mm today compared to 6 bilobed mm previously. Subsegmental atelectasis is seen in the posterior lingula and the dependent lower lobes.  Bones: Bone windows reveal no worrisome lytic or sclerotic osseous lesions.  Upper Abdomen: Tiny cysts are seen in the liver and kidneys, incompletely visualized  IMPRESSION: Stable to minimal decrease in the spiculated right lower lobe nodule. Otherwise stable exam.   Electronically Signed   By: Misty Stanley M.D.   On: 05/01/2014 15:29    ASSESSMENT AND PLAN: This is a very pleasant 80 years old white male recently diagnosed with limited stage small cell lung cancer and currently undergoing systemic chemotherapy with cisplatin and etoposide status post 5 cycles. He is tolerating his treatment fairly well with no significant adverse effects. The patient was discussed with and also seen by Dr. Julien Nordmann. He will proceed with cycle #6 today as  scheduled. He will continue with weekly labs as scheduled. He will return in 3 weeks for another symptom management visit with a restaging CT scan of the chest with contrast to reevaluate his disease. He was again advised to seek immediate medicall evaluation if he developed worsening neck pain or any neurologic symptoms. Both the patient and his wife voiced understanding.  He was advised to call immediately if he has any concerning symptoms in the interval. The patient voices understanding of current disease status and treatment options and is in agreement with the current care plan.  All questions were answered. The patient knows to call the clinic with any problems, questions or concerns. We can certainly see the patient much sooner if necessary.  Carlton Adam, PA-C 05/23/2014  ADDENDUM: Hematology/Oncology Attending: I had a face to face encounter with the patient. I recommended his care plan. This is a very pleasant 80 years old white male with limited stage small cell lung cancer currently undergoing systemic chemotherapy with cisplatin and etoposide status post 5 cycles. The patient is tolerating his treatment fairly well with no significant adverse effects except for alopecia and mild fatigue. He denied having any significant nausea or vomiting, denied having any peripheral neuropathy. We will proceed with cycle #6 today as scheduled. The patient would come back for follow-up visit in 3 weeks for reevaluation after repeating CT scan of the chest for restaging of his disease. He was advised to call immediately if he has any concerning symptoms in the interval.  Disclaimer: This note was dictated with voice recognition software. Similar sounding words can inadvertently be transcribed and may be missed upon review. Eilleen Kempf., MD 05/24/2014

## 2014-05-23 NOTE — Patient Instructions (Signed)
[facility] Discharge Instructions for Patients Receiving Chemotherapy  Today you received the following chemotherapy agents: Cisplatin/Etoposide.   To help prevent nausea and vomiting after your treatment, we encourage you to take your nausea medication as directed.    If you develop nausea and vomiting that is not controlled by your nausea medication, call the clinic.   BELOW ARE SYMPTOMS THAT SHOULD BE REPORTED IMMEDIATELY:  *FEVER GREATER THAN 100.5 F  *CHILLS WITH OR WITHOUT FEVER  NAUSEA AND VOMITING THAT IS NOT CONTROLLED WITH YOUR NAUSEA MEDICATION  *UNUSUAL SHORTNESS OF BREATH  *UNUSUAL BRUISING OR BLEEDING  TENDERNESS IN MOUTH AND THROAT WITH OR WITHOUT PRESENCE OF ULCERS  *URINARY PROBLEMS  *BOWEL PROBLEMS  UNUSUAL RASH Items with * indicate a potential emergency and should be followed up as soon as possible.  Feel free to call the clinic you have any questions or concerns. The clinic phone number is [phone].

## 2014-05-23 NOTE — Telephone Encounter (Signed)
Pt confirmed labs/ov per 12/28 POF, gave pt AVS..... KJ, sent msg to add chemo

## 2014-05-23 NOTE — Telephone Encounter (Signed)
Per staff message and POF I have scheduled appts. Advised scheduler of appts. JMW  

## 2014-05-23 NOTE — Progress Notes (Signed)
Nutrition follow-up completed with patient in chemotherapy for lung cancer. Patient reports he feels well most of the time. Weight increased slightly and documented as 123.3 pounds December 29 up from 122.5 pounds, December 7. No nutrition complaints.  Nutrition diagnosis: Unintended weight loss resolved.  Educated patient to continue high-calorie high-protein foods with oral nutrition supplements to promote weight maintenance/weight gain. Patient encouraged to contact me with questions or concerns.  **Disclaimer: This note was dictated with voice recognition software. Similar sounding words can inadvertently be transcribed and this note may contain transcription errors which may not have been corrected upon publication of note.**

## 2014-05-24 ENCOUNTER — Ambulatory Visit (HOSPITAL_BASED_OUTPATIENT_CLINIC_OR_DEPARTMENT_OTHER): Payer: Medicare Other

## 2014-05-24 DIAGNOSIS — C3491 Malignant neoplasm of unspecified part of right bronchus or lung: Secondary | ICD-10-CM

## 2014-05-24 DIAGNOSIS — C7A1 Malignant poorly differentiated neuroendocrine tumors: Secondary | ICD-10-CM

## 2014-05-24 DIAGNOSIS — Z5111 Encounter for antineoplastic chemotherapy: Secondary | ICD-10-CM

## 2014-05-24 MED ORDER — SODIUM CHLORIDE 0.9 % IJ SOLN
10.0000 mL | INTRAMUSCULAR | Status: DC | PRN
  Administered 2014-05-24: 10 mL
  Filled 2014-05-24: qty 10

## 2014-05-24 MED ORDER — DEXAMETHASONE SODIUM PHOSPHATE 10 MG/ML IJ SOLN
INTRAMUSCULAR | Status: AC
  Filled 2014-05-24: qty 1

## 2014-05-24 MED ORDER — SODIUM CHLORIDE 0.9 % IV SOLN
Freq: Once | INTRAVENOUS | Status: AC
  Administered 2014-05-24: 15:00:00 via INTRAVENOUS

## 2014-05-24 MED ORDER — HEPARIN SOD (PORK) LOCK FLUSH 100 UNIT/ML IV SOLN
500.0000 [IU] | Freq: Once | INTRAVENOUS | Status: AC | PRN
  Administered 2014-05-24: 500 [IU]
  Filled 2014-05-24: qty 5

## 2014-05-24 MED ORDER — DEXAMETHASONE SODIUM PHOSPHATE 10 MG/ML IJ SOLN
10.0000 mg | Freq: Once | INTRAMUSCULAR | Status: AC
  Administered 2014-05-24: 10 mg via INTRAVENOUS

## 2014-05-24 MED ORDER — SODIUM CHLORIDE 0.9 % IV SOLN
120.0000 mg/m2 | Freq: Once | INTRAVENOUS | Status: AC
  Administered 2014-05-24: 190 mg via INTRAVENOUS
  Filled 2014-05-24: qty 9.5

## 2014-05-24 NOTE — Patient Instructions (Signed)
Continue labs and chemotherapy as scheduled Follow-up in 3 weeks with a restaging CT scan of your chest to reevaluate your disease

## 2014-05-24 NOTE — Patient Instructions (Signed)
[facility] Discharge Instructions for Patients Receiving Chemotherapy  Today you received the following chemotherapy agents Etoposide  To help prevent nausea and vomiting after your treatment, we encourage you to take your nausea medication    If you develop nausea and vomiting that is not controlled by your nausea medication, call the clinic.   BELOW ARE SYMPTOMS THAT SHOULD BE REPORTED IMMEDIATELY:  *FEVER GREATER THAN 100.5 F  *CHILLS WITH OR WITHOUT FEVER  NAUSEA AND VOMITING THAT IS NOT CONTROLLED WITH YOUR NAUSEA MEDICATION  *UNUSUAL SHORTNESS OF BREATH  *UNUSUAL BRUISING OR BLEEDING  TENDERNESS IN MOUTH AND THROAT WITH OR WITHOUT PRESENCE OF ULCERS  *URINARY PROBLEMS  *BOWEL PROBLEMS  UNUSUAL RASH Items with * indicate a potential emergency and should be followed up as soon as possible.  Feel free to call the clinic you have any questions or concerns. The clinic phone number is [phone].

## 2014-05-25 ENCOUNTER — Ambulatory Visit (HOSPITAL_BASED_OUTPATIENT_CLINIC_OR_DEPARTMENT_OTHER): Payer: Medicare Other

## 2014-05-25 DIAGNOSIS — Z5111 Encounter for antineoplastic chemotherapy: Secondary | ICD-10-CM

## 2014-05-25 DIAGNOSIS — C3491 Malignant neoplasm of unspecified part of right bronchus or lung: Secondary | ICD-10-CM

## 2014-05-25 DIAGNOSIS — C7A1 Malignant poorly differentiated neuroendocrine tumors: Secondary | ICD-10-CM

## 2014-05-25 MED ORDER — DEXAMETHASONE SODIUM PHOSPHATE 10 MG/ML IJ SOLN
INTRAMUSCULAR | Status: AC
  Filled 2014-05-25: qty 1

## 2014-05-25 MED ORDER — SODIUM CHLORIDE 0.9 % IV SOLN
Freq: Once | INTRAVENOUS | Status: AC
  Administered 2014-05-25: 10:00:00 via INTRAVENOUS

## 2014-05-25 MED ORDER — HEPARIN SOD (PORK) LOCK FLUSH 100 UNIT/ML IV SOLN
500.0000 [IU] | Freq: Once | INTRAVENOUS | Status: AC | PRN
  Administered 2014-05-25: 500 [IU]
  Filled 2014-05-25: qty 5

## 2014-05-25 MED ORDER — SODIUM CHLORIDE 0.9 % IJ SOLN
10.0000 mL | INTRAMUSCULAR | Status: DC | PRN
  Administered 2014-05-25: 10 mL
  Filled 2014-05-25: qty 10

## 2014-05-25 MED ORDER — DEXAMETHASONE SODIUM PHOSPHATE 10 MG/ML IJ SOLN
10.0000 mg | Freq: Once | INTRAMUSCULAR | Status: AC
  Administered 2014-05-25: 10 mg via INTRAVENOUS

## 2014-05-25 MED ORDER — ETOPOSIDE CHEMO INJECTION 1 GM/50ML
120.0000 mg/m2 | Freq: Once | INTRAVENOUS | Status: AC
  Administered 2014-05-25: 190 mg via INTRAVENOUS
  Filled 2014-05-25: qty 9.5

## 2014-05-25 NOTE — Patient Instructions (Signed)
[facility] Discharge Instructions for Patients Receiving Chemotherapy  Today you received the following chemotherapy agents etoposide.    To help prevent nausea and vomiting after your treatment, we encourage you to take your nausea medication as directed If you develop nausea and vomiting that is not controlled by your nausea medication, call the clinic.   BELOW ARE SYMPTOMS THAT SHOULD BE REPORTED IMMEDIATELY:  *FEVER GREATER THAN 100.5 F  *CHILLS WITH OR WITHOUT FEVER  NAUSEA AND VOMITING THAT IS NOT CONTROLLED WITH YOUR NAUSEA MEDICATION  *UNUSUAL SHORTNESS OF BREATH  *UNUSUAL BRUISING OR BLEEDING  TENDERNESS IN MOUTH AND THROAT WITH OR WITHOUT PRESENCE OF ULCERS  *URINARY PROBLEMS  *BOWEL PROBLEMS  UNUSUAL RASH Items with * indicate a potential emergency and should be followed up as soon as possible.  Feel free to call the clinic you have any questions or concerns. The clinic phone number is [phone].

## 2014-05-27 ENCOUNTER — Ambulatory Visit (HOSPITAL_BASED_OUTPATIENT_CLINIC_OR_DEPARTMENT_OTHER): Payer: Medicare Other

## 2014-05-27 DIAGNOSIS — Z5189 Encounter for other specified aftercare: Secondary | ICD-10-CM

## 2014-05-27 DIAGNOSIS — C3491 Malignant neoplasm of unspecified part of right bronchus or lung: Secondary | ICD-10-CM

## 2014-05-27 DIAGNOSIS — C7A1 Malignant poorly differentiated neuroendocrine tumors: Secondary | ICD-10-CM

## 2014-05-27 MED ORDER — PEGFILGRASTIM INJECTION 6 MG/0.6ML ~~LOC~~
6.0000 mg | PREFILLED_SYRINGE | Freq: Once | SUBCUTANEOUS | Status: AC
  Administered 2014-05-27: 6 mg via SUBCUTANEOUS

## 2014-06-04 ENCOUNTER — Encounter: Payer: Self-pay | Admitting: Nurse Practitioner

## 2014-06-05 NOTE — Telephone Encounter (Signed)
Printed for provider review.

## 2014-06-09 ENCOUNTER — Encounter (HOSPITAL_COMMUNITY): Payer: Self-pay

## 2014-06-09 ENCOUNTER — Encounter: Payer: Self-pay | Admitting: *Deleted

## 2014-06-09 ENCOUNTER — Ambulatory Visit (HOSPITAL_COMMUNITY)
Admission: RE | Admit: 2014-06-09 | Discharge: 2014-06-09 | Disposition: A | Discharge status: Home / Self Care | Payer: Medicare Other | Source: Ambulatory Visit | Attending: Physician Assistant | Admitting: Physician Assistant

## 2014-06-09 DIAGNOSIS — Z923 Personal history of irradiation: Secondary | ICD-10-CM | POA: Diagnosis not present

## 2014-06-09 DIAGNOSIS — Z79899 Other long term (current) drug therapy: Secondary | ICD-10-CM | POA: Diagnosis not present

## 2014-06-09 DIAGNOSIS — C3490 Malignant neoplasm of unspecified part of unspecified bronchus or lung: Secondary | ICD-10-CM | POA: Diagnosis not present

## 2014-06-09 DIAGNOSIS — C3491 Malignant neoplasm of unspecified part of right bronchus or lung: Secondary | ICD-10-CM | POA: Insufficient documentation

## 2014-06-09 MED ORDER — IOHEXOL 300 MG/ML  SOLN
80.0000 mL | Freq: Once | INTRAMUSCULAR | Status: AC | PRN
  Administered 2014-06-09: 80 mL via INTRAVENOUS

## 2014-06-09 NOTE — Progress Notes (Signed)
Call report from CT chest from [facility]. Printed copy from [facility] and placed on MD desk for review. Results stable.

## 2014-06-13 ENCOUNTER — Ambulatory Visit: Payer: Medicare Other

## 2014-06-13 ENCOUNTER — Other Ambulatory Visit (HOSPITAL_BASED_OUTPATIENT_CLINIC_OR_DEPARTMENT_OTHER): Payer: Medicare Other

## 2014-06-13 ENCOUNTER — Telehealth: Payer: Self-pay | Admitting: Physician Assistant

## 2014-06-13 ENCOUNTER — Encounter: Payer: Self-pay | Admitting: Physician Assistant

## 2014-06-13 ENCOUNTER — Ambulatory Visit (HOSPITAL_BASED_OUTPATIENT_CLINIC_OR_DEPARTMENT_OTHER): Payer: Medicare Other | Admitting: Physician Assistant

## 2014-06-13 VITALS — BP 117/66 | HR 106 | Temp 97.8°F | Resp 18 | Ht 65.0 in | Wt 123.4 lb

## 2014-06-13 DIAGNOSIS — C3491 Malignant neoplasm of unspecified part of right bronchus or lung: Secondary | ICD-10-CM

## 2014-06-13 DIAGNOSIS — R5383 Other fatigue: Secondary | ICD-10-CM

## 2014-06-13 DIAGNOSIS — D6481 Anemia due to antineoplastic chemotherapy: Secondary | ICD-10-CM

## 2014-06-13 DIAGNOSIS — C7A1 Malignant poorly differentiated neuroendocrine tumors: Secondary | ICD-10-CM

## 2014-06-13 DIAGNOSIS — Z95828 Presence of other vascular implants and grafts: Secondary | ICD-10-CM

## 2014-06-13 LAB — CBC WITH DIFFERENTIAL/PLATELET
BASO%: 0.1 % (ref 0.0–2.0)
BASOS ABS: 0 10*3/uL (ref 0.0–0.1)
EOS%: 2.1 % (ref 0.0–7.0)
Eosinophils Absolute: 0.3 10*3/uL (ref 0.0–0.5)
HCT: 31.2 % — ABNORMAL LOW (ref 38.4–49.9)
HGB: 10.2 g/dL — ABNORMAL LOW (ref 13.0–17.1)
LYMPH#: 0.4 10*3/uL — AB (ref 0.9–3.3)
LYMPH%: 2.9 % — ABNORMAL LOW (ref 14.0–49.0)
MCH: 35.7 pg — ABNORMAL HIGH (ref 27.2–33.4)
MCHC: 32.7 g/dL (ref 32.0–36.0)
MCV: 109.1 fL — ABNORMAL HIGH (ref 79.3–98.0)
MONO#: 1 10*3/uL — ABNORMAL HIGH (ref 0.1–0.9)
MONO%: 6.3 % (ref 0.0–14.0)
NEUT%: 88.6 % — ABNORMAL HIGH (ref 39.0–75.0)
NEUTROS ABS: 13.3 10*3/uL — AB (ref 1.5–6.5)
Platelets: 163 10*3/uL (ref 140–400)
RBC: 2.86 10*6/uL — AB (ref 4.20–5.82)
RDW: 15.9 % — AB (ref 11.0–14.6)
WBC: 15 10*3/uL — ABNORMAL HIGH (ref 4.0–10.3)

## 2014-06-13 LAB — COMPREHENSIVE METABOLIC PANEL (CC13)
ALK PHOS: 99 U/L (ref 40–150)
ALT: 12 U/L (ref 0–55)
AST: 21 U/L (ref 5–34)
Albumin: 3.9 g/dL (ref 3.5–5.0)
Anion Gap: 10 mEq/L (ref 3–11)
BILIRUBIN TOTAL: 0.35 mg/dL (ref 0.20–1.20)
BUN: 18.8 mg/dL (ref 7.0–26.0)
CALCIUM: 8.4 mg/dL (ref 8.4–10.4)
CO2: 25 meq/L (ref 22–29)
Chloride: 105 mEq/L (ref 98–109)
Creatinine: 1 mg/dL (ref 0.7–1.3)
EGFR: 76 mL/min/{1.73_m2} — AB (ref >90)
GLUCOSE: 109 mg/dL (ref 70–140)
POTASSIUM: 4.1 meq/L (ref 3.5–5.1)
SODIUM: 140 meq/L (ref 136–145)
Total Protein: 6.3 g/dL — ABNORMAL LOW (ref 6.4–8.3)

## 2014-06-13 LAB — MAGNESIUM (CC13): Magnesium: 2.2 mg/dl (ref 1.5–2.5)

## 2014-06-13 MED ORDER — HEPARIN SOD (PORK) LOCK FLUSH 100 UNIT/ML IV SOLN
500.0000 [IU] | Freq: Once | INTRAVENOUS | Status: AC
  Administered 2014-06-13: 500 [IU] via INTRAVENOUS
  Filled 2014-06-13: qty 5

## 2014-06-13 MED ORDER — SODIUM CHLORIDE 0.9 % IJ SOLN
10.0000 mL | INTRAMUSCULAR | Status: DC | PRN
  Administered 2014-06-13: 10 mL via INTRAVENOUS
  Filled 2014-06-13: qty 10

## 2014-06-13 NOTE — Telephone Encounter (Signed)
Gave avs & calendar for March. °

## 2014-06-13 NOTE — Progress Notes (Signed)
[facility] [phone]   [phone]  OFFICE PROGRESS NOTE  Patrick Pel, MD [address] [place] [address]  DIAGNOSIS: Limited stage small cell lung cancer, stage IIA (T1a, N1, M0) presented with right lower lobe nodule in addition to right hilar lymphadenopathy diagnosed in August of 2015.  PRIOR THERAPY:  1. Status post a course of radiotherapy under the care of Dr. [name] 2. Systemic chemotherapy with cisplatin 60 mg/M2 on day 1 and etoposide at 120 mg/M2 on days 1, 2 and 3 with Neulasta support on day 4. Status post 6 cycles.  CURRENT THERAPY:  Observation  INTERVAL HISTORY: Patrick Carr 81 y.o. male returns to the clinic today for followup visit accompanied by his wife and granddaughter. He presents today with no specific complaints. He completed 6 cycles of systemic chemotherapy with cisplatin and etoposide with Neulasta support as well as a course of radiotherapy in the care of Dr. [name]. He restaging CT scan of his chest with contrast and presents to discuss the results. He tolerated his systemic chemotherapy with cisplatin and etoposide fairly well with no significant adverse effects. He denied having any fever or chills. He has no nausea or vomiting. He denied having any significant chest pain, shortness of breath, cough or hemoptysis. He has no significant weight loss or night sweats. He continues to complain of some right hip pain as well as some pain in his upper back and neck area. His upper back and neck pain was present prior to the initiation of chemotherapy. He denies any numbness or tingling into the neck or extremities.   MEDICAL HISTORY: Past Medical History  Diagnosis Date  . Hypertension   . Hyperlipidemia   . BPH (benign prostatic hyperplasia)   . Lesion of right lung     RLL  . RBBB     no palpations  . Renal cysts, acquired, bilateral   . History of kidney stones   . Hernia,  inguinal left   . History of blood transfusion     artery in sinus bleed  . Phlebitis 03/20/14    bilateral forearms  . Radiation 02/08/14-03/21/14    Stage III lung cancer 30 fractions    ALLERGIES:  is allergic to other and pneumococcal vaccines.  MEDICATIONS:  Current Outpatient Prescriptions  Medication Sig Dispense Refill  . alfuzosin (UROXATRAL) 10 MG 24 hr tablet Take 10 mg by mouth daily.    [name] ALPRAZolam (XANAX) 0.25 MG tablet Take 0.25 mg by mouth 3 (three) times daily as needed for anxiety.    [name] amLODipine (NORVASC) 5 MG tablet Take 5 mg by mouth daily.    [name] aspirin 81 MG tablet Take 81 mg by mouth daily.    [name] atorvastatin (LIPITOR) 40 MG tablet Take 40 mg by mouth daily.    . cephALEXin (KEFLEX) 500 MG capsule Take 1 capsule (500 mg total) by mouth 4 (four) times daily. 40 capsule 0  . lidocaine-prilocaine (EMLA) cream Apply 1 application topically as needed. Apply over port site 1 hour prior to chemo. Do not rub in medicine. 30 g 0  . losartan (COZAAR) 100 MG tablet Take 100 mg by mouth daily.    . tamsulosin (FLOMAX) 0.4 MG CAPS capsule Take 0.4 mg by mouth daily.    . prochlorperazine (COMPAZINE) 10 MG tablet Take 1 tablet (10 mg total) by mouth every 6 (six) hours as needed for nausea or vomiting. (Patient not taking: Reported  on 06/13/2014) 30 tablet 0   No current facility-administered medications for this visit.   Facility-Administered Medications Ordered in Other Visits  Medication Dose Route Frequency Provider Last Rate Last Dose  . sodium chloride 0.9 % injection 10 mL  10 mL Intravenous PRN [name], MD   10 mL at 05/23/14 1635    SURGICAL HISTORY:  Past Surgical History  Procedure Laterality Date  . Sinus artery surgery      not surgery  . No past surgeries    . Video bronchoscopy with endobronchial navigation N/A 01/18/2014    Procedure: VIDEO BRONCHOSCOPY WITH ENDOBRONCHIAL NAVIGATION;  Surgeon: [name], MD;  Location: [facility];  Service:  Thoracic;  Laterality: N/A;  . Video bronchoscopy with endobronchial ultrasound N/A 01/18/2014    Procedure: VIDEO BRONCHOSCOPY WITH ENDOBRONCHIAL ULTRASOUND;  Surgeon: [name], MD;  Location: [facility];  Service: Thoracic;  Laterality: N/A;    REVIEW OF SYSTEMS:  A comprehensive review of systems was negative.   PHYSICAL EXAMINATION: General appearance: alert, cooperative and no distress Head: Normocephalic, without obvious abnormality, atraumatic Neck: no adenopathy, no JVD, supple, symmetrical, trachea midline and thyroid not enlarged, symmetric, no tenderness/mass/nodules Lymph nodes: Cervical, supraclavicular, and axillary nodes normal. Resp: clear to auscultation bilaterally Back: symmetric, no curvature. ROM normal. No CVA tenderness. Cardio: regular rate and rhythm, S1, S2 normal, no murmur, click, rub or gallop GI: soft, non-tender; bowel sounds normal; no masses,  no organomegaly Extremities: extremities normal, atraumatic, no cyanosis or edema  ECOG PERFORMANCE STATUS: 1 - Symptomatic but completely ambulatory  Blood pressure 117/66, pulse 106, temperature 97.8 F (36.6 C), temperature source Oral, resp. rate 18, height _0  (1.651 m), weight 123 lb 6.4 oz (55.974 kg).  LABORATORY DATA: Lab Results  Component Value Date   WBC 15.0* 06/13/2014   HGB 10.2* 06/13/2014   HCT 31.2* 06/13/2014   MCV 109.1* 06/13/2014   PLT 163 06/13/2014      Chemistry      Component Value Date/Time   NA 140 06/13/2014 0904   NA 138 01/18/2014 0640   K 4.1 06/13/2014 0904   K 4.5 01/18/2014 0640   CL 99 01/18/2014 0640   CO2 25 06/13/2014 0904   CO2 26 01/18/2014 0640   BUN 18.8 06/13/2014 0904   BUN 19 01/18/2014 0640   CREATININE 1.0 06/13/2014 0904   CREATININE 0.63 01/18/2014 0640   CREATININE 0.80 01/12/2014 1519      Component Value Date/Time   CALCIUM 8.4 06/13/2014 0904   CALCIUM 9.0 01/18/2014 0640   ALKPHOS 99 06/13/2014 0904   ALKPHOS 88 01/18/2014 0640   AST  21 06/13/2014 0904   AST 31 01/18/2014 0640   ALT 12 06/13/2014 0904   ALT 18 01/18/2014 0640   BILITOT 0.35 06/13/2014 0904   BILITOT 0.4 01/18/2014 0640       RADIOGRAPHIC STUDIES: Ct Chest W Contrast  06/09/2014   CLINICAL DATA:  Lung cancer diagnosed August 2015, completed radiation therapy. Ongoing chemotherapy.  EXAM: CT CHEST WITH CONTRAST  TECHNIQUE: Multidetector CT imaging of the chest was performed during intravenous contrast administration.  CONTRAST:  67m OMNIPAQUE IOHEXOL 300 MG/ML  SOLN  COMPARISON:  05/01/2014  FINDINGS: Mediastinum/Nodes: Heart size is normal. Right-sided Port-A-Cath in place with tip at the cavoatrial junction. No pericardial effusion. Small mediastinal nodes measuring 5 mm and smaller are stable. There is a persistent nodular filling defect within the right lateral trachea measuring 0.9 cm image 21 series 5. Moderate  to severe atheromatous aortic calcification noted without aneurysm.  Lungs/Pleura: Emphysematous changes are noted. Right apical pleural thickening/scarring is stable with internal calcification, measuring overall 3.2 x 2.0 cm, stable. Right lower lobe spiculated nodule measuring 1.1 x 0.4 cm image 30 is stable. Mild central bronchial wall thickening is reidentified. No pleural effusion.  Upper abdomen: Low-density renal cortical lesions most compatible with cysts are reidentified. Adrenal glands are normal.  Musculoskeletal: No acute osseous abnormality. Multilevel disc degenerative change noted within the thoracic spine with extensive mid thoracic kyphosis reidentified and stable mid thoracic compression deformities.  IMPRESSION: No significant interval change in probable right apical scarring and a right lower lobe spiculated 1.1 cm nodule.  Right lateral tracheal intraluminal lesion, for which stability raises the question of a polyp or a neoplasm rather than secretions. Further evaluation at bronchoscopy is recommended.  These results will be called  to the ordering clinician or representative by the Radiologist Assistant, and communication documented in the PACS or zVision Dashboard.   Electronically Signed   By: Conchita Paris M.D.   On: 06/09/2014 13:33    ASSESSMENT AND PLAN: This is a very pleasant 81 years old white male recently diagnosed with limited stage small cell lung cancer and currently undergoing systemic chemotherapy with cisplatin and etoposide status post 5 cycles. He tolerated his treatment fairly well with no significant adverse effects. The patient was discussed with and also seen by Dr. Julien Nordmann. His CT scan revealed stable disease. There was however an area in the right lateral tracheal intraluminal in which revealed a lesion that was questionable for a polyp or neoplasm rather than secretions. Further evaluation with bronchoscopy was recommended. This was discussed with the patient and his family. Dr. Julien Nordmann will discuss this and review the CT with Dr. Servando Snare and a decision will be made whether to proceed with bronchoscopy at this point or to watch this area with the next CT scan which will be in 2 months. The patient and his family are fine to proceed with bronchoscopy if that is what is recommended. He will remain on observation at this time and follow-up in 2 months with repeat CBC differential C met and CT the chest with contrast to reevaluate his disease. If it is decided to proceed with bronchoscopy Dr. Everrett Coombe office will contact the patient with the date and time of this procedure.  He was advised to call immediately if he has any concerning symptoms in the interval. The patient voices understanding of current disease status and treatment options and is in agreement with the current care plan.  All questions were answered. The patient knows to call the clinic with any problems, questions or concerns. We can certainly see the patient much sooner if necessary.  Carlton Adam, PA-C 06/13/2014  ADDENDUM:    Hematology/Oncology Attending:  I had a face to face encounter with the patient today. I recommended his care plan. This is a very pleasant 81 years old white male with limited stage small cell lung cancer status post a course of radiotherapy to the chest and 6 cycles of systemic chemotherapy with cisplatin and etoposide. The patient tolerated his treatment fairly well except for fatigue secondary to chemotherapy-induced anemia. His recent CT scan of the chest showed significant improvement in his disease except for a suspicious endobronchial lesions that has persisted over few scans and probably would require bronchoscopy for evaluation. We will arrange for the patient to see Dr. Servando Snare for evaluation and consideration of repeat bronchoscopy if  needed. He will continue on observation for now with repeat CT scan of the chest in 2 months for reevaluation of his disease. He was advised to call immediately if he has any concerning symptoms in the interval.  Disclaimer: This note was dictated with voice recognition software. Similar sounding words can inadvertently be transcribed and may be missed upon review. Eilleen Kempf., MD 06/13/2014

## 2014-06-13 NOTE — Patient Instructions (Signed)
Your CT scan revealed stable disease with the exception of an area of concern within the anterior trachea. If bronchoscopy is recommended Dr. Everrett Coombe office will call you to set up a date and time for this procedure. He will remain on observation and follow-up in 2 months with a restaging CT scan of your chest to reevaluate your disease

## 2014-06-13 NOTE — Patient Instructions (Signed)

## 2014-06-13 NOTE — Addendum Note (Signed)
Addended by: Patsy Lager on: 06/13/2014 11:10 AM   Modules accepted: Orders, SmartSet

## 2014-06-14 ENCOUNTER — Ambulatory Visit: Payer: Medicare Other

## 2014-06-15 ENCOUNTER — Ambulatory Visit: Payer: Medicare Other

## 2014-06-19 ENCOUNTER — Encounter: Payer: Medicare Other | Admitting: Cardiothoracic Surgery

## 2014-06-28 ENCOUNTER — Other Ambulatory Visit: Payer: Self-pay | Admitting: *Deleted

## 2014-06-28 ENCOUNTER — Telehealth: Payer: Self-pay | Admitting: Internal Medicine

## 2014-06-28 NOTE — Telephone Encounter (Signed)
Left message to confirm lab appointment change from 03/22 to 03/18 before ct scan. Mailed calendar.

## 2014-06-29 ENCOUNTER — Ambulatory Visit (INDEPENDENT_AMBULATORY_CARE_PROVIDER_SITE_OTHER): Payer: Medicare Other | Admitting: Cardiothoracic Surgery

## 2014-06-29 ENCOUNTER — Other Ambulatory Visit: Payer: Self-pay | Admitting: *Deleted

## 2014-06-29 ENCOUNTER — Encounter: Payer: Self-pay | Admitting: Cardiothoracic Surgery

## 2014-06-29 VITALS — BP 159/90 | HR 100 | Resp 20 | Ht 65.0 in | Wt 123.0 lb

## 2014-06-29 DIAGNOSIS — R911 Solitary pulmonary nodule: Secondary | ICD-10-CM

## 2014-06-29 DIAGNOSIS — C3411 Malignant neoplasm of upper lobe, right bronchus or lung: Secondary | ICD-10-CM | POA: Diagnosis not present

## 2014-06-29 DIAGNOSIS — C3410 Malignant neoplasm of upper lobe, unspecified bronchus or lung: Secondary | ICD-10-CM

## 2014-06-29 NOTE — Progress Notes (Signed)
[address]       [place] [address]             [phone]                    [name] [place] Medical Record #[id number] Date of Birth: Jan 06, 1934  Referring: [name],* Primary Care: [name], MD  Chief Complaint:    Chief Complaint  Patient presents with  . Lung Cancer    surgical evaluation and consideration of repeat bronchoscopy, completed radiation and chemotherapy    History of Present Illness:    Patrick Carr 81 y.o. male is seen in the office  today for evaluation tracheal abnormality. The patient presented in August 2015  with 10-15 lb weight lass over 3 months. Patient is a long term and current smoker.  He was noted to have a elevated PSA and microscopic hematuria. He was referred to urology, a CT of chest abdomen pelvis was done. He has a distant history of rt upper lung lesion on ct 2007. Current chest xray and ct demonstrate new rt lung lesions. Patient has no hemoptysis . Denies any  previous cardiac history or current chest pain/ angina.  Patient is retired, previous worked in [employer]. 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   Zubrod Score: At the time of surgery this patient's most appropriate activity status/level should be described as: []     0    Normal activity, no symptoms []     1    Restricted in physical strenuous activity but ambulatory, able to do out light work [x]     2    Ambulatory and capable of self care, unable to do work activities, up and about               >50 % of waking hours                               []     3    Only limited self care, in bed greater than 50% of waking hours []     4    Completely disabled, no self care, confined to bed or chair []     5    Moribund   Past Medical History  Diagnosis Date  . Hypertension   . Hyperlipidemia   . BPH (benign prostatic hyperplasia)   . Lesion of right lung     RLL  . RBBB     no palpations  . Renal cysts, acquired, bilateral   . History of kidney stones   . Hernia, inguinal left   . History of blood transfusion     artery in sinus bleed  . Phlebitis 03/20/14    bilateral forearms  . Radiation 02/08/14-03/21/14    Stage III lung cancer 30 fractions    Past Surgical History  Procedure Laterality Date  . Sinus artery surgery      not surgery  . No past surgeries    .  Video bronchoscopy with endobronchial navigation N/A 01/18/2014    Procedure: VIDEO BRONCHOSCOPY WITH ENDOBRONCHIAL NAVIGATION;  Surgeon: [name], MD;  Location: [facility] OR;  Service: Thoracic;  Laterality: N/A;  . Video bronchoscopy with endobronchial ultrasound N/A 01/18/2014    Procedure: VIDEO BRONCHOSCOPY WITH ENDOBRONCHIAL ULTRASOUND;  Surgeon: [name], MD;  Location: [facility] OR;  Service: Thoracic;  Laterality: N/A;    Family History  Problem Relation Age of Onset  . Heart disease Mother   . Cancer Father    Father died 81 with brain tumor, mother deceased  86 with heart failure     History  Smoking status  . Former Smoker -- 0.50 packs/day for 62 years  . Quit date: 01/29/2014  Smokeless tobacco  . Never Used    History  Alcohol Use No     Allergies  Allergen Reactions  . Other Other (See Comments)    Pneumonia vaccine causes arm swelling   . Pneumococcal Vaccines     Arm swelling    Current Outpatient Prescriptions  Medication Sig Dispense Refill  . alfuzosin (UROXATRAL) 10 MG 24 hr tablet Take 10 mg by mouth daily.    [name] ALPRAZolam (XANAX) 0.25 MG tablet Take 0.25 mg by mouth 3 (three) times daily as needed for anxiety.     [name] amLODipine (NORVASC) 5 MG tablet Take 5 mg by mouth daily.    [name] aspirin 81 MG tablet Take 81 mg by mouth daily.    [name] atorvastatin (LIPITOR) 40 MG tablet Take 40 mg by mouth daily.    . cephALEXin (KEFLEX) 500 MG capsule Take 1 capsule (500 mg total) by mouth 4 (four) times daily. 40 capsule 0  . lidocaine-prilocaine (EMLA) cream Apply 1 application topically as needed. Apply over port site 1 hour prior to chemo. Do not rub in medicine. 30 g 0  . losartan (COZAAR) 100 MG tablet Take 100 mg by mouth daily.    . prochlorperazine (COMPAZINE) 10 MG tablet Take 1 tablet (10 mg total) by mouth every 6 (six) hours as needed for nausea or vomiting. 30 tablet 0  . tamsulosin (FLOMAX) 0.4 MG CAPS capsule Take 0.4 mg by mouth daily.     No current facility-administered medications for this visit.   Facility-Administered Medications Ordered in Other Visits  Medication Dose Route Frequency Provider Last Rate Last Dose  . sodium chloride 0.9 % injection 10 mL  10 mL Intravenous PRN [name], MD   10 mL at 05/23/14 1635     Review of Systems:     Cardiac Review of Systems: Y or N  Chest Pain [ n   ]  Resting SOB [name]   ] Exertional SOB  [name]  ]  Orthopnea [ n ]   Pedal Edema [  n ]    Palpitations [nn  ] Syncope  [ n ]   Presyncope [n   ]  General Review of Systems: [Y] = yes [  ]=no Constitional: recent weight change [name]  ];  Wt loss over the last 3 months [   ] anorexia [  ]; fatigue [ y ]; nausea [  ]; night sweats [ nn ]; fever [  ]; or chills [  ];          Dental: poor dentition[  ]; Last Dentist visit:   Eye : blurred vision [  ]; diplopia [   ]; vision changes [  ];  Amaurosis fugax[  ]; Resp: cough [  ];  wheezing[  ];  hemoptysis[  ]; shortness of breath[  ]; paroxysmal nocturnal dyspnea[  ]; dyspnea on exertion[  ]; or orthopnea[  ];  GI:  gallstones[  ], vomiting[  ];  dysphagia[  ]; melena[  ];  hematochezia [  ]; heartburn[  ];   Hx of  Colonoscopy[  ]; GU: kidney stones [  ];  hematuria[  ];   dysuria [  ];  nocturia[  ];  history of     obstruction [  ]; urinary frequency [  ]             Skin: rash, swelling[  ];, hair loss[  ];  peripheral edema[  ];  or itching[  ]; Musculosketetal: myalgias[  ];  joint swelling[  ];  joint erythema[  ];  joint pain[  ];  back pain[  ];  Heme/Lymph: bruising[  ];  bleeding[  ];  anemia[  ];  Neuro: TIA[  ];  headaches[  ];  stroke[  ];  vertigo[  ];  seizures[  ];   paresthesias[  ];  difficulty walking[  ];  Psych:depression[  ]; anxiety[  ];  Endocrine: diabetes[  ];  thyroid dysfunction[  ];  Immunizations: Flu up to date [no  ]; Pneumococcal up to date [ 2004 ];  Other:  Physical Exam: BP 159/90 mmHg  Pulse 100  Resp 20  Ht 5\' 5"  (1.651 m)  Wt 123 lb (55.792 kg)  BMI 20.47 kg/m2  SpO2 94%  PHYSICAL EXAMINATION:  General appearance: alert, cooperative, appears older than stated age and cachectic Neurologic: intact Heart: regular rate and rhythm, S1, S2 normal, no murmur, click, rub or gallop Lungs: diminished breath sounds bibasilar Abdomen: soft, non-tender; bowel sounds normal; no masses,  no organomegaly Extremities: extremities normal, atraumatic, no cyanosis or edema and Homans sign is negative, no sign of DVT +1 dp and pt pulses, no carotid bruites No cervical, axillary or supraclavicular adenopathy Patient has significant alopecia related to chemotherapy Wt Readings from Last 3 Encounters:  06/29/14 123 lb (55.792 kg)  06/13/14 123 lb 6.4 oz (55.974 kg)  05/23/14 123 lb 4.8 oz (55.929 kg)   Diagnostic Studies & Laboratory data:     Recent Radiology Findings: Ct Chest W Contrast  06/09/2014   CLINICAL DATA:  Lung cancer diagnosed August 2015, completed radiation therapy. Ongoing chemotherapy.  EXAM: CT CHEST WITH CONTRAST  TECHNIQUE: Multidetector CT imaging of the chest was performed during intravenous contrast administration.  CONTRAST:  86mL OMNIPAQUE IOHEXOL 300 MG/ML  SOLN  COMPARISON:  05/01/2014   FINDINGS: Mediastinum/Nodes: Heart size is normal. Right-sided Port-A-Cath in place with tip at the cavoatrial junction. No pericardial effusion. Small mediastinal nodes measuring 5 mm and smaller are stable. There is a persistent nodular filling defect within the right lateral trachea measuring 0.9 cm image 21 series 5. Moderate to severe atheromatous aortic calcification noted without aneurysm.  Lungs/Pleura: Emphysematous changes are noted. Right apical pleural thickening/scarring is stable with internal calcification, measuring overall 3.2 x 2.0 cm, stable. Right lower lobe spiculated nodule measuring 1.1 x 0.4 cm image 30 is stable. Mild central bronchial wall thickening is reidentified. No pleural effusion.  Upper abdomen: Low-density renal cortical lesions most compatible with cysts are reidentified. Adrenal glands are normal.  Musculoskeletal: No acute osseous abnormality. Multilevel disc degenerative change noted within the thoracic spine with extensive mid thoracic kyphosis reidentified and stable mid thoracic compression deformities.  IMPRESSION: No significant interval change in probable right apical  scarring and a right lower lobe spiculated 1.1 cm nodule.  Right lateral tracheal intraluminal lesion, for which stability raises the question of a polyp or a neoplasm rather than secretions. Further evaluation at bronchoscopy is recommended.  These results will be called to the ordering clinician or representative by the Radiologist Assistant, and communication documented in the PACS or zVision Dashboard.   Electronically Signed   By: Conchita Paris M.D.   On: 06/09/2014 13:33   I have independently reviewed the above radiology studies  and reviewed the findings with the patient.   Mr Jeri Cos Wo Contrast  01/13/2014   CLINICAL DATA:  Lung nodule.  Staging for suspected lung cancer.  EXAM: MRI HEAD WITHOUT AND WITH CONTRAST  TECHNIQUE: Multiplanar, multiecho pulse sequences of the brain and  surrounding structures were obtained without and with intravenous contrast.  CONTRAST:  48mL MULTIHANCE GADOBENATE DIMEGLUMINE 529 MG/ML IV SOLN  COMPARISON:  None.  FINDINGS: No evidence for acute infarction, hemorrhage, mass lesion, hydrocephalus, or extra-axial fluid. Generalized cerebral and cerebellar atrophy. Advanced subcortical and periventricular T2 hyperintensities consistent with chronic microvascular ischemic change. Scattered lacunes. Flow voids are maintained. Mild pannus. Upper cervical fusion across the C2 and C3 interspace may be degenerative in nature.  Post infusion, no abnormal enhancement of the brain or meninges. Extracranial soft tissues unremarkable. No acute sinus disease. Trace mastoid effusion. Negative orbits.  IMPRESSION: No evidence for intracranial metastatic disease.  Advanced atrophy and small vessel disease.   Electronically Signed   By: Rolla Flatten M.D.   On: 01/13/2014 12:03   Nm Pet Image Initial (pi) Skull Base To Thigh  01/12/2014   CLINICAL DATA:  Initial treatment strategy for pulmonary nodule.  EXAM: NUCLEAR MEDICINE PET SKULL BASE TO THIGH  TECHNIQUE: 6.1 mCi F-18 FDG was injected intravenously. Full-ring PET imaging was performed from the skull base to thigh after the radiotracer. CT data was obtained and used for attenuation correction and anatomic localization.  FASTING BLOOD GLUCOSE:  Value: 120 mg/dl  COMPARISON:  01/03/2014  FINDINGS: NECK  No hypermetabolic lymph nodes in the neck.  CHEST  Pulmonary nodule in the superior segment of right lower lobe measures 1.2 cm and has an SUV max equal to 3.9. Within the perihilar right lung there is a nodule or lymph node which measures approximately 1.3 cm and has an SUV max equal to 6.9. More centrally, there is a hypermetabolic right hilar lymph node within SUV max equal to 4.8. No hypermetabolic if see lateral or contralateral mediastinal adenopathy. There is no hypermetabolic contralateral hilar adenopathy.  The heart  size appears normal. There is no pericardial effusion. Calcified atherosclerotic disease involves the thoracic aorta. There are also calcifications involving the left circumflex and LAD coronary arteries. No axillary or supraclavicular adenopathy.  ABDOMEN/PELVIS  No abnormal uptake identified within the liver. The gallbladder is normal. The pancreas is unremarkable. Normal appearance of the spleen. The right adrenal gland appears normal. Mild nonspecific increased uptake identified within the left adrenal region. The SUV max is equal to 3.3.  No hypermetabolic lymph nodes identified within the abdomen or pelvis.  SKELETON  No focal hypermetabolic activity to suggest skeletal metastasis.  IMPRESSION: 1. Peripheral nodule in the right lower lobe exhibits malignant range FDG uptake and is worrisome for primary lung neoplasm. 2. Hypermetabolic  ipsilateral hilar lymph node metastasis. 3. Well-circumscribed hypermetabolic nodule within the right infrahilar region. This may represent a second pulmonary lesion or ipsilateral hilar lymph metastatic lymph node. 4. If  the right infrahilar nodule is a separate pulmonary lesion then the stage would be considered T3 N1 M0 (IIIa). If the infrahilar nodule is a lymph node then this would be considered stage T1 N1 M0 (IIa). A contrast-enhanced CT of the chest may allow differentiation of this lesion. 5. Nonspecific FDG uptake associated with the left adrenal nodule. 6. Atherosclerotic disease including coronary artery calcifications 7. Emphysema.   Electronically Signed   By: Kerby Moors M.D.   On: 01/12/2014 15:06   CLINICAL DATA: Hematuria. Abnormal chest radiograph with possible new pulmonary nodule. History of BPH and renal calculi.  EXAM: CT CHEST WITHOUT CONTRAST; CT ABDOMEN AND PELVIS WITHOUT AND WITH CONTRAST  TECHNIQUE: Multidetector CT imaging of the chest was performed without intravenous contrast. Multidetector CT imaging of the abdomen and pelvis was  performed following the standard protocol before and during bolus administration of intravenous contrast.  CONTRAST: 125 ml Isovue-300.  COMPARISON: Chest radiographs 12/07/2003. Chest CT 07/24/2005.  FINDINGS: CT CHEST  Mediastinum: As evaluated in the noncontrast state, there is no evidence of mediastinal or axillary lymphadenopathy. Hilar assessment is limited. However, there is a new 1.3 x 1.2 cm nodular density projecting posterior to the right hilum on image 33, suspicious for a nodal metastasis. The thyroid gland, trachea and esophagus appear normal. The heart size is normal. There is diffuse atherosclerosis of the aorta, great vessels and coronary arteries.  Lungs/Pleura: There is no pleural or pericardial effusion.Moderate diffuse emphysematous changes are present within both lungs. The partially calcified subpleural scarring at the right apex is similar, measuring 3.1 x 2.3 cm transverse on image 5. Corresponding with new radiographic finding is a spiculated nodule in the superior segment of the right lower lobe, measuring 1.3 x 0.9 cm on image 29. Morphologically, this is highly suspicious for bronchogenic carcinoma. There are no other suspicious pulmonary nodules.  Musculoskeletal/Chest wall: No chest wall lesions or suspicious osseous findings demonstrated.  CT ABDOMEN AND PELVIS FINDINGS  Kidneys / Ureters / Bladder: Pre-contrast images demonstrate no renal, ureteral or bladder calculi. Multiple simple renal cysts are present bilaterally, the largest posteriorly in the mid left kidney, measuring 2.5 cm. The left kidney demonstrates several hyperdense lesions. The largest of these are within the upper pole, measuring 12 mm on image 14 of series 4 and in the lower pole, measuring 13 mm on image 30. There is no definite enhancement of these lesions following contrast. Delayed images result in segmental visualization of the ureters. No urothelial abnormalities are  identified. The bladder is trabeculated without apparent focal mucosal lesion.  Other Solid Abdominal Viscera: Small hepatic cysts are noted. No evidence of gallstones, gallbladder wall thickening or biliary dilatation. The pancreas appears normal. The spleen and adrenal glands appear normal.  Bowel/Mesentery: The stomach, small bowel and colon demonstrate no significant findings. The appendix is not clearly visualized. No ascites or peritoneal nodularity.  Retroperitoneum/Pelvis: There are no enlarged abdominal or pelvic lymph nodes. There is moderate atherosclerosis of the aorta, its branches and the iliac arteries. The prostate gland demonstrates mild central dystrophic calcification. There is a small left inguinal hernia containing primarily fat.  Bones / Musculoskeletal: No acute or significant osseus findings. No evidence of metastatic disease.  IMPRESSION: 1. The recently identified right pulmonary nodule corresponds with a spiculated lesion in the right lower lobe, morphologically concerning for bronchogenic carcinoma. There is new retrohilar nodularity on the right suspicious for nodal metastasis. No distant metastases identified. 2. Thoracic surgical consultation recommended. PET-CT may be helpful  for further staging. 3. No specific explanation for hematuria. There are complex and simple renal cysts as described. 4. Moderate atherosclerosis. 5. Small left inguinal hernia containing fat.   Electronically Signed By: Camie Patience M.D. On: 01/03/2014 10:16      Recent Lab Findings: Lab Results  Component Value Date   WBC 15.0* 06/13/2014   PFT"s  FEV1  1.3 61%  DLCO 42%    Assessment / Plan:   Patient has stabilized after treatment for small cell carcinoma, has a persistent area along the right side of the trachea that appeared to be mucus or secretions but has been persistent in the same location. Patient is referred by Dr. Earlie Server for consideration of  bronchoscopy to obtain a specific tissue diagnosis, if in fact this is malignancy. I've discussed the proceeding with bronchoscopy under general anesthesia with the patient and his wife to obtain a biopsy and consider endobronchial treatment. Risks and options are discussed with the patient in detail and he is willing to proceed. I've asked him to hold his aspirin until next week at the time of the procedure. We'll plan to proceed on February 9.  I've spent 30 minutes with the patient more than half on counseling and discussing with him and reviewing the CT scans with him and discussing diagnostic options and options for treatment.  [name] MD      [address] [place] [address] Office [phone]   Beeper [phone]  06/29/2014 3:28 PM

## 2014-06-29 NOTE — Patient Instructions (Signed)
Hold asa until after surgery next week

## 2014-07-03 ENCOUNTER — Ambulatory Visit (HOSPITAL_COMMUNITY)
Admission: RE | Admit: 2014-07-03 | Discharge: 2014-07-03 | Disposition: A | Discharge status: Home / Self Care | Payer: Medicare Other | Source: Ambulatory Visit | Attending: Cardiothoracic Surgery | Admitting: Cardiothoracic Surgery

## 2014-07-03 ENCOUNTER — Encounter (HOSPITAL_COMMUNITY)
Admission: RE | Admit: 2014-07-03 | Discharge: 2014-07-03 | Disposition: A | Discharge status: Home / Self Care | Payer: Medicare Other | Source: Ambulatory Visit | Attending: Cardiothoracic Surgery | Admitting: Cardiothoracic Surgery

## 2014-07-03 ENCOUNTER — Other Ambulatory Visit (HOSPITAL_COMMUNITY): Payer: Self-pay | Admitting: *Deleted

## 2014-07-03 ENCOUNTER — Encounter (HOSPITAL_COMMUNITY): Payer: Self-pay

## 2014-07-03 VITALS — BP 99/63 | HR 88 | Temp 97.9°F | Resp 20 | Ht 65.0 in | Wt 124.1 lb

## 2014-07-03 DIAGNOSIS — J398 Other specified diseases of upper respiratory tract: Secondary | ICD-10-CM

## 2014-07-03 DIAGNOSIS — Z87891 Personal history of nicotine dependence: Secondary | ICD-10-CM | POA: Diagnosis not present

## 2014-07-03 DIAGNOSIS — Z85118 Personal history of other malignant neoplasm of bronchus and lung: Secondary | ICD-10-CM | POA: Diagnosis not present

## 2014-07-03 DIAGNOSIS — E785 Hyperlipidemia, unspecified: Secondary | ICD-10-CM | POA: Diagnosis not present

## 2014-07-03 DIAGNOSIS — I10 Essential (primary) hypertension: Secondary | ICD-10-CM | POA: Diagnosis not present

## 2014-07-03 DIAGNOSIS — Z01818 Encounter for other preprocedural examination: Secondary | ICD-10-CM | POA: Insufficient documentation

## 2014-07-03 DIAGNOSIS — N400 Enlarged prostate without lower urinary tract symptoms: Secondary | ICD-10-CM | POA: Diagnosis not present

## 2014-07-03 DIAGNOSIS — Z01811 Encounter for preprocedural respiratory examination: Secondary | ICD-10-CM | POA: Diagnosis not present

## 2014-07-03 DIAGNOSIS — C3410 Malignant neoplasm of upper lobe, unspecified bronchus or lung: Secondary | ICD-10-CM

## 2014-07-03 DIAGNOSIS — C3491 Malignant neoplasm of unspecified part of right bronchus or lung: Secondary | ICD-10-CM | POA: Diagnosis not present

## 2014-07-03 HISTORY — DX: Cardiac arrhythmia, unspecified: I49.9

## 2014-07-03 HISTORY — DX: Malignant (primary) neoplasm, unspecified: C80.1

## 2014-07-03 LAB — COMPREHENSIVE METABOLIC PANEL
ALT: 16 U/L (ref 0–53)
AST: 24 U/L (ref 0–37)
Albumin: 4.1 g/dL (ref 3.5–5.2)
Alkaline Phosphatase: 64 U/L (ref 39–117)
Anion gap: 8 (ref 5–15)
BUN: 27 mg/dL — ABNORMAL HIGH (ref 6–23)
CO2: 26 mmol/L (ref 19–32)
Calcium: 9.6 mg/dL (ref 8.4–10.5)
Chloride: 106 mmol/L (ref 96–112)
Creatinine, Ser: 0.93 mg/dL (ref 0.50–1.35)
GFR calc Af Amer: >90 mL/min (ref >90)
GFR calc non Af Amer: 78 mL/min — ABNORMAL LOW (ref >90)
Glucose, Bld: 172 mg/dL — ABNORMAL HIGH (ref 70–99)
Potassium: 4.1 mmol/L (ref 3.5–5.1)
Sodium: 140 mmol/L (ref 135–145)
Total Bilirubin: 0.8 mg/dL (ref 0.3–1.2)
Total Protein: 6.6 g/dL (ref 6.0–8.3)

## 2014-07-03 LAB — TYPE AND SCREEN
ABO/RH(D): O NEG
Antibody Screen: NEGATIVE

## 2014-07-03 LAB — URINE MICROSCOPIC-ADD ON

## 2014-07-03 LAB — CBC
HCT: 37.9 % — ABNORMAL LOW (ref 39.0–52.0)
Hemoglobin: 12.5 g/dL — ABNORMAL LOW (ref 13.0–17.0)
MCH: 35 pg — ABNORMAL HIGH (ref 26.0–34.0)
MCHC: 33 g/dL (ref 30.0–36.0)
MCV: 106.2 fL — ABNORMAL HIGH (ref 78.0–100.0)
Platelets: 159 10*3/uL (ref 150–400)
RBC: 3.57 MIL/uL — ABNORMAL LOW (ref 4.22–5.81)
RDW: 13.6 % (ref 11.5–15.5)
WBC: 5.9 10*3/uL (ref 4.0–10.5)

## 2014-07-03 LAB — BLOOD GAS, ARTERIAL
Acid-Base Excess: 2.8 mmol/L — ABNORMAL HIGH (ref 0.0–2.0)
Bicarbonate: 26.5 mEq/L — ABNORMAL HIGH (ref 20.0–24.0)
Drawn by: 42180
FIO2: 0.21 %
O2 Saturation: 97.3 %
Patient temperature: 98.6
TCO2: 27.7 mmol/L (ref 0–100)
pCO2 arterial: 38.6 mmHg (ref 35.0–45.0)
pH, Arterial: 7.451 — ABNORMAL HIGH (ref 7.350–7.450)
pO2, Arterial: 85.2 mmHg (ref 80.0–100.0)

## 2014-07-03 LAB — URINALYSIS, ROUTINE W REFLEX MICROSCOPIC
Glucose, UA: NEGATIVE mg/dL
Ketones, ur: NEGATIVE mg/dL
Leukocytes, UA: NEGATIVE
Nitrite: NEGATIVE
Protein, ur: 30 mg/dL — AB
Specific Gravity, Urine: 1.02 (ref 1.005–1.030)
Urobilinogen, UA: 0.2 mg/dL (ref 0.0–1.0)
pH: 5 (ref 5.0–8.0)

## 2014-07-03 LAB — PROTIME-INR
INR: 1.05 (ref 0.00–1.49)
Prothrombin Time: 13.8 seconds (ref 11.6–15.2)

## 2014-07-03 LAB — ABO/RH: ABO/RH(D): O NEG

## 2014-07-03 LAB — APTT: aPTT: 28 seconds (ref 24–37)

## 2014-07-03 LAB — SURGICAL PCR SCREEN
MRSA, PCR: NEGATIVE
STAPHYLOCOCCUS AUREUS: POSITIVE — AB

## 2014-07-03 MED ORDER — MUPIROCIN 2 % EX OINT
1.0000 "application " | TOPICAL_OINTMENT | Freq: Once | CUTANEOUS | Status: AC
  Administered 2014-07-04: 1 via TOPICAL
  Filled 2014-07-03: qty 22

## 2014-07-03 MED ORDER — DEXTROSE 5 % IV SOLN
1.5000 g | INTRAVENOUS | Status: AC
  Administered 2014-07-04: 1.5 g via INTRAVENOUS
  Filled 2014-07-03: qty 1.5

## 2014-07-03 NOTE — Pre-Procedure Instructions (Signed)
Patrick Carr  07/03/2014   Your procedure is scheduled on:  Tuesday, July 04, 2014 at 7:30 AM.   Report to [facility] Entrance "A" Admitting Office at 5:30 AM.   Call this number if you have problems the morning of surgery: [phone]     Remember:   Do not eat food or drink liquids after midnight tonight.   Take these medicines the morning of surgery with A SIP OF WATER: Amlodipine (Norvasc), Alfuzosin(Uroxatral)   Do not wear jewelry.  Do not wear lotions, powders, or cologne. You may NOT wear deodorant.  Men may shave face and neck.  Do not bring valuables to the hospital.  [facility] is not responsible                  for any belongings or valuables.               Contacts, dentures or bridgework may not be worn into surgery.  Leave suitcase in the car. After surgery it may be brought to your room.  For patients admitted to the hospital, discharge time is determined by your                treatment team.               Special Instructions: [facility] - Preparing for Surgery  Before surgery, you can play an important role.  Because skin is not sterile, your skin needs to be as free of germs as possible.  You can reduce the number of germs on you skin by washing with CHG (chlorahexidine gluconate) soap before surgery.  CHG is an antiseptic cleaner which kills germs and bonds with the skin to continue killing germs even after washing.  Please DO NOT use if you have an allergy to CHG or antibacterial soaps.  If your skin becomes reddened/irritated stop using the CHG and inform your nurse when you arrive at Short Stay.  Do not shave (including legs and underarms) for at least 48 hours prior to the first CHG shower.  You may shave your face.  Please follow these instructions carefully:   1.  Shower with CHG Soap the night before surgery and the                                morning of Surgery.  2.  If you choose to wash your hair, wash your hair first as usual  with your       normal shampoo.  3.  After you shampoo, rinse your hair and body thoroughly to remove the                      Shampoo.  4.  Use CHG as you would any other liquid soap.  You can apply chg directly       to the skin and wash gently with scrungie or a clean washcloth.  5.  Apply the CHG Soap to your body ONLY FROM THE NECK DOWN.        Do not use on open wounds or open sores.  Avoid contact with your eyes, ears, mouth and genitals (private parts).  Wash genitals (private parts) with your normal soap.  6.  Wash thoroughly, paying special attention to the area where your surgery        will be performed.  7.  Thoroughly rinse your body with  warm water from the neck down.  8.  DO NOT shower/wash with your normal soap after using and rinsing off       the CHG Soap.  9.  Pat yourself dry with a clean towel.            10.  Wear clean pajamas.            11.  Place clean sheets on your bed the night of your first shower and do not        sleep with pets.  Day of Surgery  Do not apply any lotions/deodorants the morning of surgery.  Please wear clean clothes to the hospital.     Please read over the following fact sheets that you were given: Pain Booklet, Coughing and Deep Breathing, Blood Transfusion Information, MRSA Information and Surgical Site Infection Prevention

## 2014-07-04 ENCOUNTER — Inpatient Hospital Stay (HOSPITAL_COMMUNITY): Payer: Medicare Other | Admitting: Anesthesiology

## 2014-07-04 ENCOUNTER — Encounter (HOSPITAL_COMMUNITY)
Admission: RE | Disposition: A | Discharge status: Home / Self Care | Payer: Self-pay | Source: Ambulatory Visit | Attending: Cardiothoracic Surgery

## 2014-07-04 ENCOUNTER — Ambulatory Visit (HOSPITAL_COMMUNITY)
Admission: RE | Admit: 2014-07-04 | Discharge: 2014-07-04 | Disposition: A | Discharge status: Home / Self Care | Payer: Medicare Other | Source: Ambulatory Visit | Attending: Cardiothoracic Surgery | Admitting: Cardiothoracic Surgery

## 2014-07-04 ENCOUNTER — Encounter (HOSPITAL_COMMUNITY): Payer: Self-pay | Admitting: Certified Registered Nurse Anesthetist

## 2014-07-04 DIAGNOSIS — I10 Essential (primary) hypertension: Secondary | ICD-10-CM | POA: Diagnosis not present

## 2014-07-04 DIAGNOSIS — E785 Hyperlipidemia, unspecified: Secondary | ICD-10-CM | POA: Insufficient documentation

## 2014-07-04 DIAGNOSIS — C3491 Malignant neoplasm of unspecified part of right bronchus or lung: Secondary | ICD-10-CM | POA: Insufficient documentation

## 2014-07-04 DIAGNOSIS — J398 Other specified diseases of upper respiratory tract: Secondary | ICD-10-CM | POA: Insufficient documentation

## 2014-07-04 DIAGNOSIS — N400 Enlarged prostate without lower urinary tract symptoms: Secondary | ICD-10-CM | POA: Insufficient documentation

## 2014-07-04 DIAGNOSIS — Z87891 Personal history of nicotine dependence: Secondary | ICD-10-CM | POA: Insufficient documentation

## 2014-07-04 DIAGNOSIS — C3411 Malignant neoplasm of upper lobe, right bronchus or lung: Secondary | ICD-10-CM

## 2014-07-04 DIAGNOSIS — C3490 Malignant neoplasm of unspecified part of unspecified bronchus or lung: Secondary | ICD-10-CM | POA: Diagnosis not present

## 2014-07-04 DIAGNOSIS — R911 Solitary pulmonary nodule: Secondary | ICD-10-CM

## 2014-07-04 DIAGNOSIS — C3410 Malignant neoplasm of upper lobe, unspecified bronchus or lung: Secondary | ICD-10-CM

## 2014-07-04 DIAGNOSIS — J0410 Acute tracheitis without obstruction: Secondary | ICD-10-CM | POA: Diagnosis not present

## 2014-07-04 HISTORY — PX: VIDEO BRONCHOSCOPY: SHX5072

## 2014-07-04 SURGERY — BRONCHOSCOPY, VIDEO-ASSISTED
Anesthesia: General | Site: Bronchus

## 2014-07-04 MED ORDER — GLYCOPYRROLATE 0.2 MG/ML IJ SOLN
INTRAMUSCULAR | Status: DC | PRN
  Administered 2014-07-04: 0.4 mg via INTRAVENOUS

## 2014-07-04 MED ORDER — EPINEPHRINE HCL 1 MG/ML IJ SOLN
INTRAMUSCULAR | Status: AC
  Filled 2014-07-04: qty 1

## 2014-07-04 MED ORDER — NEOSTIGMINE METHYLSULFATE 10 MG/10ML IV SOLN
INTRAVENOUS | Status: DC | PRN
  Administered 2014-07-04: 3 mg via INTRAVENOUS

## 2014-07-04 MED ORDER — ONDANSETRON HCL 4 MG/2ML IJ SOLN: 4.0000 mg | Freq: Four times a day (QID) | INTRAMUSCULAR | Status: DC | PRN

## 2014-07-04 MED ORDER — PROPOFOL 10 MG/ML IV BOLUS
INTRAVENOUS | Status: AC
  Filled 2014-07-04: qty 20

## 2014-07-04 MED ORDER — FENTANYL CITRATE 0.05 MG/ML IJ SOLN: 25.0000 ug | INTRAMUSCULAR | Status: DC | PRN

## 2014-07-04 MED ORDER — LACTATED RINGERS IV SOLN
INTRAVENOUS | Status: DC | PRN
  Administered 2014-07-04: 07:00:00 via INTRAVENOUS

## 2014-07-04 MED ORDER — EPINEPHRINE HCL 1 MG/ML IJ SOLN
INTRAMUSCULAR | Status: DC | PRN
  Administered 2014-07-04: 1 mg

## 2014-07-04 MED ORDER — FENTANYL CITRATE 0.05 MG/ML IJ SOLN
INTRAMUSCULAR | Status: AC
  Filled 2014-07-04: qty 5

## 2014-07-04 MED ORDER — PROPOFOL 10 MG/ML IV BOLUS
INTRAVENOUS | Status: DC | PRN
  Administered 2014-07-04: 110 mg via INTRAVENOUS

## 2014-07-04 MED ORDER — 0.9 % SODIUM CHLORIDE (POUR BTL) OPTIME
TOPICAL | Status: DC | PRN
  Administered 2014-07-04: 1000 mL

## 2014-07-04 MED ORDER — OXYCODONE HCL 5 MG/5ML PO SOLN
5.0000 mg | Freq: Once | ORAL | Status: DC | PRN
Start: 2014-07-04 — End: 2014-07-04

## 2014-07-04 MED ORDER — ROCURONIUM BROMIDE 100 MG/10ML IV SOLN
INTRAVENOUS | Status: DC | PRN
  Administered 2014-07-04: 30 mg via INTRAVENOUS

## 2014-07-04 MED ORDER — OXYCODONE HCL 5 MG PO TABS: 5.0000 mg | ORAL_TABLET | Freq: Once | ORAL | Status: DC | PRN

## 2014-07-04 MED ORDER — DEXAMETHASONE SODIUM PHOSPHATE 10 MG/ML IJ SOLN
INTRAMUSCULAR | Status: DC | PRN
  Administered 2014-07-04: 10 mg via INTRAVENOUS

## 2014-07-04 MED ORDER — FENTANYL CITRATE 0.05 MG/ML IJ SOLN
INTRAMUSCULAR | Status: DC | PRN
  Administered 2014-07-04: 100 ug via INTRAVENOUS

## 2014-07-04 MED ORDER — ONDANSETRON HCL 4 MG/2ML IJ SOLN
INTRAMUSCULAR | Status: DC | PRN
  Administered 2014-07-04: 4 mg via INTRAVENOUS

## 2014-07-04 MED ORDER — PHENYLEPHRINE HCL 10 MG/ML IJ SOLN
INTRAMUSCULAR | Status: DC | PRN
  Administered 2014-07-04 (×4): 120 ug via INTRAVENOUS

## 2014-07-04 MED ORDER — EPHEDRINE SULFATE 50 MG/ML IJ SOLN
INTRAMUSCULAR | Status: DC | PRN
  Administered 2014-07-04: 15 mg via INTRAVENOUS
  Administered 2014-07-04: 10 mg via INTRAVENOUS

## 2014-07-04 MED ORDER — LIDOCAINE HCL (CARDIAC) 20 MG/ML IV SOLN
INTRAVENOUS | Status: DC | PRN
  Administered 2014-07-04: 50 mg via INTRAVENOUS

## 2014-07-04 SURGICAL SUPPLY — 22 items
BRUSH CYTOL CELLEBRITY 1.5X140 (MISCELLANEOUS) ×1 IMPLANT
CANISTER SUCTION 2500CC (MISCELLANEOUS) ×2 IMPLANT
CONT SPEC 4OZ CLIKSEAL STRL BL (MISCELLANEOUS) ×4 IMPLANT
COVER TABLE BACK 60X90 (DRAPES) ×2 IMPLANT
FORCEPS BIOP RJ4 1.8 (CUTTING FORCEPS) IMPLANT
GAUZE SPONGE 4X4 12PLY STRL (GAUZE/BANDAGES/DRESSINGS) ×2 IMPLANT
GLOVE BIO SURGEON STRL SZ 6.5 (GLOVE) ×1 IMPLANT
GLOVE BIOGEL PI IND STRL 7.0 (GLOVE) IMPLANT
GLOVE BIOGEL PI INDICATOR 7.0 (GLOVE) ×1
GOWN STRL REUS W/ TWL LRG LVL3 (GOWN DISPOSABLE) IMPLANT
GOWN STRL REUS W/TWL LRG LVL3 (GOWN DISPOSABLE) ×4
KIT CLEAN ENDO COMPLIANCE (KITS) ×2 IMPLANT
KIT ROOM TURNOVER OR (KITS) ×2 IMPLANT
MARKER SKIN DUAL TIP RULER LAB (MISCELLANEOUS) ×2 IMPLANT
NDL BIOPSY TRANSBRONCH 21G (NEEDLE) IMPLANT
NEEDLE BIOPSY TRANSBRONCH 21G (NEEDLE) IMPLANT
NS IRRIG 1000ML POUR BTL (IV SOLUTION) ×2 IMPLANT
OIL SILICONE PENTAX (PARTS (SERVICE/REPAIRS)) ×2 IMPLANT
SYR 20ML ECCENTRIC (SYRINGE) ×2 IMPLANT
TOWEL OR 17X24 6PK STRL BLUE (TOWEL DISPOSABLE) ×2 IMPLANT
TRAP SPECIMEN MUCOUS 40CC (MISCELLANEOUS) ×2 IMPLANT
TUBE CONNECTING 20X1/4 (TUBING) ×3 IMPLANT

## 2014-07-04 NOTE — Brief Op Note (Signed)
      [address]       [place] [address]             [phone]      07/04/2014  8:50 AM  PATIENT:  Patrick Carr  81 y.o. male  PRE-OPERATIVE DIAGNOSIS:  LUNG CANCER  POST-OPERATIVE DIAGNOSIS:  LUNG CANCER  PROCEDURE:  Procedure(s): VIDEO BRONCHOSCOPY WITH  ENDOBRONCIAL BIOPSY (N/A)  SURGEON:  Surgeon(s) and Role:    * [name], MD - Primary    ANESTHESIA:   general  EBL:  Total I/O In: 800 [I.V.:800] Out: -   BLOOD ADMINISTERED:none  DRAINS: none   LOCAL MEDICATIONS USED:  NONE  SPECIMEN:  Source of Specimen:  trachea  DISPOSITION OF SPECIMEN:  PATHOLOGY  COUNTS:  YES  DICTATION: .Dragon Dictation and Other Dictation: Dictation Number .  PLAN OF CARE: Discharge to home after PACU  PATIENT DISPOSITION:  PACU - hemodynamically stable.   Delay start of Pharmacological VTE agent (>24hrs) due to surgical blood loss or risk of bleeding: yes  Findings, no definite mass noted, area slight bulge of tracheal wall bx done, see photo

## 2014-07-04 NOTE — Anesthesia Procedure Notes (Signed)
Procedure Name: Intubation Date/Time: 07/04/2014 7:40 AM Performed by: Shirlyn Goltz Pre-anesthesia Checklist: Patient identified, Emergency Drugs available, Suction available and Patient being monitored Patient Re-evaluated:Patient Re-evaluated prior to inductionOxygen Delivery Method: Circle system utilized Preoxygenation: Pre-oxygenation with 100% oxygen Intubation Type: IV induction Ventilation: Mask ventilation without difficulty and Oral airway inserted - appropriate to patient size Laryngoscope Size: Mac and 4 Grade View: Grade I Tube type: Oral Tube size: 8.5 mm Number of attempts: 1 Airway Equipment and Method: Stylet Placement Confirmation: ETT inserted through vocal cords under direct vision,  positive ETCO2 and breath sounds checked- equal and bilateral Secured at: 21 cm Tube secured with: Tape Dental Injury: Teeth and Oropharynx as per pre-operative assessment

## 2014-07-04 NOTE — Anesthesia Preprocedure Evaluation (Signed)
Anesthesia Evaluation  Patient identified by MRN, date of birth, ID band Patient awake    Reviewed: Allergy & Precautions, NPO status , Patient's Chart, lab work & pertinent test results  Airway Mallampati: II   Neck ROM: full    Dental   Pulmonary former smoker,  Lung CA         Cardiovascular hypertension,     Neuro/Psych    GI/Hepatic   Endo/Other    Renal/GU      Musculoskeletal   Abdominal   Peds  Hematology   Anesthesia Other Findings   Reproductive/Obstetrics                             Anesthesia Physical Anesthesia Plan  ASA: II  Anesthesia Plan: General   Post-op Pain Management:    Induction: Intravenous  Airway Management Planned: Oral ETT  Additional Equipment:   Intra-op Plan:   Post-operative Plan: Extubation in OR  Informed Consent: I have reviewed the patients History and Physical, chart, labs and discussed the procedure including the risks, benefits and alternatives for the proposed anesthesia with the patient or authorized representative who has indicated his/her understanding and acceptance.     Plan Discussed with: CRNA, Anesthesiologist and Surgeon  Anesthesia Plan Comments:         Anesthesia Quick Evaluation

## 2014-07-04 NOTE — H&P (Signed)
[address]       [place] [address]             [phone]                    Criston R Vanvorst [place] Medical Record #[id number] Date of Birth: April 22, 1934  Referring: [name],* Primary Care: [name], MD  Chief Complaint:    Abnormal ct scan of chest, tracheal abnormality   History of Present Illness:    Patrick Carr 81 y.o. male is seen in the office  today for evaluation tracheal abnormality. The patient presented in August 2015  with 10-15 lb weight lass over 3 months. Patient is a long term and current smoker.  He was noted to have a elevated PSA and microscopic hematuria. He was referred to urology, a CT of chest abdomen pelvis was done. He has a distant history of rt upper lung lesion on ct 2007. Current chest xray and ct demonstrate new rt lung lesions. Patient has no hemoptysis . Denies any  previous cardiac history or current chest pain/ angina.  Patient is retired, previous worked in [employer]. 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   Zubrod Score: At the time of surgery this patient's most appropriate activity status/level should be described as: []     0    Normal activity, no symptoms []     1    Restricted in physical strenuous activity but ambulatory, able to do out light work [x]     2    Ambulatory and capable of self care, unable to do work activities, up and about               >50 % of waking hours                              []     3    Only limited self care, in bed greater than 50% of waking hours []     4    Completely  disabled, no self care, confined to bed or chair []     5    Moribund   Past Medical History  Diagnosis Date  . Hypertension   . Hyperlipidemia   . BPH (benign prostatic hyperplasia)   . Lesion of right lung     RLL  . RBBB     no palpations  . Renal cysts, acquired, bilateral   . History of kidney stones   . Hernia, inguinal left   . History of blood transfusion     artery in sinus bleed  . Phlebitis 03/20/14    bilateral forearms  . Radiation 02/08/14-03/21/14    Stage III lung cancer 30 fractions  . Cancer     Lung  . Irregular heart rate     Past Surgical History  Procedure Laterality Date  . Sinus artery surgery      not surgery  . No past surgeries    . Video bronchoscopy with endobronchial  navigation N/A 01/18/2014    Procedure: VIDEO BRONCHOSCOPY WITH ENDOBRONCHIAL NAVIGATION;  Surgeon: [name], MD;  Location: [facility] OR;  Service: Thoracic;  Laterality: N/A;  . Video bronchoscopy with endobronchial ultrasound N/A 01/18/2014    Procedure: VIDEO BRONCHOSCOPY WITH ENDOBRONCHIAL ULTRASOUND;  Surgeon: [name], MD;  Location: [facility] OR;  Service: Thoracic;  Laterality: N/A;    Family History  Problem Relation Age of Onset  . Heart disease Mother   . Cancer Father    Father died 3 with brain tumor, mother deceased  90 with heart failure     History  Smoking status  . Former Smoker -- 0.50 packs/day for 62 years  . Quit date: 01/29/2014  Smokeless tobacco  . Never Used    History  Alcohol Use No     Allergies  Allergen Reactions  . Other Other (See Comments)    Pneumonia vaccine causes arm swelling, caused redness  . Pneumococcal Vaccines     Arm swelling    Current Facility-Administered Medications  Medication Dose Route Frequency Provider Last Rate Last Dose  . cefUROXime (ZINACEF) 1.5 g in dextrose 5 % 50 mL IVPB  1.5 g Intravenous 60 min Pre-Op [name], MD       Facility-Administered Medications Ordered in Other Encounters    Medication Dose Route Frequency Provider Last Rate Last Dose  . sodium chloride 0.9 % injection 10 mL  10 mL Intravenous PRN [name], MD   10 mL at 05/23/14 1635     Review of Systems:     Cardiac Review of Systems: Y or N  Chest Pain [ n   ]  Resting SOB [name]   ] Exertional SOB  [name]  ]  Orthopnea [ n ]   Pedal Edema [  n ]    Palpitations [nn  ] Syncope  [ n ]   Presyncope [n   ]  General Review of Systems: [Y] = yes [  ]=no Constitional: recent weight change [name]  ];  Wt loss over the last 3 months [   ] anorexia [  ]; fatigue [ y ]; nausea [  ]; night sweats [ nn ]; fever [  ]; or chills [  ];          Dental: poor dentition[  ]; Last Dentist visit:   Eye : blurred vision [  ]; diplopia [   ]; vision changes [  ];  Amaurosis fugax[  ]; Resp: cough [  ];  wheezing[  ];  hemoptysis[  ]; shortness of breath[  ]; paroxysmal nocturnal dyspnea[  ]; dyspnea on exertion[  ]; or orthopnea[  ];  GI:  gallstones[  ], vomiting[  ];  dysphagia[  ]; melena[  ];  hematochezia [  ]; heartburn[  ];   Hx of  Colonoscopy[  ]; GU: kidney stones [  ]; hematuria[  ];   dysuria [  ];  nocturia[  ];  history of     obstruction [  ]; urinary frequency [  ]             Skin: rash, swelling[  ];, hair loss[  ];  peripheral edema[  ];  or itching[  ]; Musculosketetal: myalgias[  ];  joint swelling[  ];  joint erythema[  ];  joint pain[  ];  back pain[  ];  Heme/Lymph: bruising[  ];  bleeding[  ];  anemia[  ];  Neuro: TIA[  ];  headaches[  ];  stroke[  ];  vertigo[  ];  seizures[  ];   paresthesias[  ];  difficulty walking[  ];  Psych:depression[  ]; anxiety[  ];  Endocrine: diabetes[  ];  thyroid dysfunction[  ];  Immunizations: Flu up to date [no  ]; Pneumococcal up to date [ 2004 ];  Other:  Physical Exam: BP 142/90 mmHg  Pulse 100  Temp(Src) 97.7 F (36.5 C) (Oral)  Resp 17  Ht 5\' 5"  (1.651 m)  Wt 124 lb (56.246 kg)  BMI 20.63 kg/m2  SpO2 98%  PHYSICAL EXAMINATION:  General appearance: alert,  cooperative, appears older than stated age and cachectic Neurologic: intact Heart: regular rate and rhythm, S1, S2 normal, no murmur, click, rub or gallop Lungs: diminished breath sounds bibasilar Abdomen: soft, non-tender; bowel sounds normal; no masses,  no organomegaly Extremities: extremities normal, atraumatic, no cyanosis or edema and Homans sign is negative, no sign of DVT +1 dp and pt pulses, no carotid bruites No cervical, axillary or supraclavicular adenopathy Patient has significant alopecia related to chemotherapy   Wt Readings from Last 3 Encounters:  07/04/14 124 lb (56.246 kg)  07/03/14 124 lb 1.6 oz (56.291 kg)  06/29/14 123 lb (55.792 kg)   Diagnostic Studies & Laboratory data:     Recent Radiology Findings: Dg Chest 2 View  07/03/2014   CLINICAL DATA:  Preoperative evaluation for bronchoscopy with known tracheal mass and history of lung carcinoma  EXAM: CHEST  2 VIEW  COMPARISON:  06/09/2014  FINDINGS: Cardiac shadow is stable. A right-sided chest wall port is again seen and stable. The lungs are well aerated bilaterally. The known apical scarring on the right is again seen and stable. No new focal infiltrate or sizable effusion is noted mild degenerative change of the thoracic spine is seen.  IMPRESSION: No acute abnormality noted.   Electronically Signed   By: Inez Catalina M.D.   On: 07/03/2014 12:06      Ct Chest W Contrast  06/09/2014   CLINICAL DATA:  Lung cancer diagnosed August 2015, completed radiation therapy. Ongoing chemotherapy.  EXAM: CT CHEST WITH CONTRAST  TECHNIQUE: Multidetector CT imaging of the chest was performed during intravenous contrast administration.  CONTRAST:  1mL OMNIPAQUE IOHEXOL 300 MG/ML  SOLN  COMPARISON:  05/01/2014  FINDINGS: Mediastinum/Nodes: Heart size is normal. Right-sided Port-A-Cath in place with tip at the cavoatrial junction. No pericardial effusion. Small mediastinal nodes measuring 5 mm and smaller are stable. There is a  persistent nodular filling defect within the right lateral trachea measuring 0.9 cm image 21 series 5. Moderate to severe atheromatous aortic calcification noted without aneurysm.  Lungs/Pleura: Emphysematous changes are noted. Right apical pleural thickening/scarring is stable with internal calcification, measuring overall 3.2 x 2.0 cm, stable. Right lower lobe spiculated nodule measuring 1.1 x 0.4 cm image 30 is stable. Mild central bronchial wall thickening is reidentified. No pleural effusion.  Upper abdomen: Low-density renal cortical lesions most compatible with cysts are reidentified. Adrenal glands are normal.  Musculoskeletal: No acute osseous abnormality. Multilevel disc degenerative change noted within the thoracic spine with extensive mid thoracic kyphosis reidentified and stable mid thoracic compression deformities.  IMPRESSION: No significant interval change in probable right apical scarring and a right lower lobe spiculated 1.1 cm nodule.  Right lateral tracheal intraluminal lesion, for which stability raises the question of a polyp or a neoplasm rather than secretions. Further evaluation at bronchoscopy is recommended.  These results will be called to the ordering clinician  or representative by the Radiologist Assistant, and communication documented in the PACS or zVision Dashboard.   Electronically Signed   By: Conchita Paris M.D.   On: 06/09/2014 13:33   I have independently reviewed the above radiology studies  and reviewed the findings with the patient.   Patrick Carr Wo Contrast  01/13/2014   CLINICAL DATA:  Lung nodule.  Staging for suspected lung cancer.  EXAM: MRI HEAD WITHOUT AND WITH CONTRAST  TECHNIQUE: Multiplanar, multiecho pulse sequences of the brain and surrounding structures were obtained without and with intravenous contrast.  CONTRAST:  72mL MULTIHANCE GADOBENATE DIMEGLUMINE 529 MG/ML IV SOLN  COMPARISON:  None.  FINDINGS: No evidence for acute infarction, hemorrhage, mass  lesion, hydrocephalus, or extra-axial fluid. Generalized cerebral and cerebellar atrophy. Advanced subcortical and periventricular T2 hyperintensities consistent with chronic microvascular ischemic change. Scattered lacunes. Flow voids are maintained. Mild pannus. Upper cervical fusion across the C2 and C3 interspace may be degenerative in nature.  Post infusion, no abnormal enhancement of the brain or meninges. Extracranial soft tissues unremarkable. No acute sinus disease. Trace mastoid effusion. Negative orbits.  IMPRESSION: No evidence for intracranial metastatic disease.  Advanced atrophy and small vessel disease.   Electronically Signed   By: Rolla Flatten M.D.   On: 01/13/2014 12:03   Nm Pet Image Initial (pi) Skull Base To Thigh  01/12/2014   CLINICAL DATA:  Initial treatment strategy for pulmonary nodule.  EXAM: NUCLEAR MEDICINE PET SKULL BASE TO THIGH  TECHNIQUE: 6.1 mCi F-18 FDG was injected intravenously. Full-ring PET imaging was performed from the skull base to thigh after the radiotracer. CT data was obtained and used for attenuation correction and anatomic localization.  FASTING BLOOD GLUCOSE:  Value: 120 mg/dl  COMPARISON:  01/03/2014  FINDINGS: NECK  No hypermetabolic lymph nodes in the neck.  CHEST  Pulmonary nodule in the superior segment of right lower lobe measures 1.2 cm and has an SUV max equal to 3.9. Within the perihilar right lung there is a nodule or lymph node which measures approximately 1.3 cm and has an SUV max equal to 6.9. More centrally, there is a hypermetabolic right hilar lymph node within SUV max equal to 4.8. No hypermetabolic if see lateral or contralateral mediastinal adenopathy. There is no hypermetabolic contralateral hilar adenopathy.  The heart size appears normal. There is no pericardial effusion. Calcified atherosclerotic disease involves the thoracic aorta. There are also calcifications involving the left circumflex and LAD coronary arteries. No axillary or  supraclavicular adenopathy.  ABDOMEN/PELVIS  No abnormal uptake identified within the liver. The gallbladder is normal. The pancreas is unremarkable. Normal appearance of the spleen. The right adrenal gland appears normal. Mild nonspecific increased uptake identified within the left adrenal region. The SUV max is equal to 3.3.  No hypermetabolic lymph nodes identified within the abdomen or pelvis.  SKELETON  No focal hypermetabolic activity to suggest skeletal metastasis.  IMPRESSION: 1. Peripheral nodule in the right lower lobe exhibits malignant range FDG uptake and is worrisome for primary lung neoplasm. 2. Hypermetabolic  ipsilateral hilar lymph node metastasis. 3. Well-circumscribed hypermetabolic nodule within the right infrahilar region. This may represent a second pulmonary lesion or ipsilateral hilar lymph metastatic lymph node. 4. If the right infrahilar nodule is a separate pulmonary lesion then the stage would be considered T3 N1 M0 (IIIa). If the infrahilar nodule is a lymph node then this would be considered stage T1 N1 M0 (IIa). A contrast-enhanced CT of the chest may allow differentiation of  this lesion. 5. Nonspecific FDG uptake associated with the left adrenal nodule. 6. Atherosclerotic disease including coronary artery calcifications 7. Emphysema.   Electronically Signed   By: Kerby Moors M.D.   On: 01/12/2014 15:06   CLINICAL DATA: Hematuria. Abnormal chest radiograph with possible new pulmonary nodule. History of BPH and renal calculi.  EXAM: CT CHEST WITHOUT CONTRAST; CT ABDOMEN AND PELVIS WITHOUT AND WITH CONTRAST  TECHNIQUE: Multidetector CT imaging of the chest was performed without intravenous contrast. Multidetector CT imaging of the abdomen and pelvis was performed following the standard protocol before and during bolus administration of intravenous contrast.  CONTRAST: 125 ml Isovue-300.  COMPARISON: Chest radiographs 12/07/2003. Chest CT 07/24/2005.  FINDINGS: CT  CHEST  Mediastinum: As evaluated in the noncontrast state, there is no evidence of mediastinal or axillary lymphadenopathy. Hilar assessment is limited. However, there is a new 1.3 x 1.2 cm nodular density projecting posterior to the right hilum on image 33, suspicious for a nodal metastasis. The thyroid gland, trachea and esophagus appear normal. The heart size is normal. There is diffuse atherosclerosis of the aorta, great vessels and coronary arteries.  Lungs/Pleura: There is no pleural or pericardial effusion.Moderate diffuse emphysematous changes are present within both lungs. The partially calcified subpleural scarring at the right apex is similar, measuring 3.1 x 2.3 cm transverse on image 5. Corresponding with new radiographic finding is a spiculated nodule in the superior segment of the right lower lobe, measuring 1.3 x 0.9 cm on image 29. Morphologically, this is highly suspicious for bronchogenic carcinoma. There are no other suspicious pulmonary nodules.  Musculoskeletal/Chest wall: No chest wall lesions or suspicious osseous findings demonstrated.  CT ABDOMEN AND PELVIS FINDINGS  Kidneys / Ureters / Bladder: Pre-contrast images demonstrate no renal, ureteral or bladder calculi. Multiple simple renal cysts are present bilaterally, the largest posteriorly in the mid left kidney, measuring 2.5 cm. The left kidney demonstrates several hyperdense lesions. The largest of these are within the upper pole, measuring 12 mm on image 14 of series 4 and in the lower pole, measuring 13 mm on image 30. There is no definite enhancement of these lesions following contrast. Delayed images result in segmental visualization of the ureters. No urothelial abnormalities are identified. The bladder is trabeculated without apparent focal mucosal lesion.  Other Solid Abdominal Viscera: Small hepatic cysts are noted. No evidence of gallstones, gallbladder wall thickening or biliary dilatation.  The pancreas appears normal. The spleen and adrenal glands appear normal.  Bowel/Mesentery: The stomach, small bowel and colon demonstrate no significant findings. The appendix is not clearly visualized. No ascites or peritoneal nodularity.  Retroperitoneum/Pelvis: There are no enlarged abdominal or pelvic lymph nodes. There is moderate atherosclerosis of the aorta, its branches and the iliac arteries. The prostate gland demonstrates mild central dystrophic calcification. There is a small left inguinal hernia containing primarily fat.  Bones / Musculoskeletal: No acute or significant osseus findings. No evidence of metastatic disease.  IMPRESSION: 1. The recently identified right pulmonary nodule corresponds with a spiculated lesion in the right lower lobe, morphologically concerning for bronchogenic carcinoma. There is new retrohilar nodularity on the right suspicious for nodal metastasis. No distant metastases identified. 2. Thoracic surgical consultation recommended. PET-CT may be helpful for further staging. 3. No specific explanation for hematuria. There are complex and simple renal cysts as described. 4. Moderate atherosclerosis. 5. Small left inguinal hernia containing fat.   Electronically Signed By: Camie Patience M.D. On: 01/03/2014 10:16      Recent Lab Findings:  Lab Results  Component Value Date   WBC 5.9 07/03/2014   HGB 12.5* 07/03/2014   HCT 37.9* 07/03/2014   PLT 159 07/03/2014   GLUCOSE 172* 07/03/2014   ALT 16 07/03/2014   AST 24 07/03/2014   NA 140 07/03/2014   K 4.1 07/03/2014   CL 106 07/03/2014   CREATININE 0.93 07/03/2014   BUN 27* 07/03/2014   CO2 26 07/03/2014   INR 1.05 07/03/2014   Lab Results  Component Value Date   WBC 5.9 07/03/2014   PFT"s  FEV1  1.3 61%  DLCO 42%    Assessment / Plan:   Patient has stabilized after treatment for small cell carcinoma, has a persistent area along the right side of the trachea that appeared to  be mucus or secretions but has been persistent in the same location. Patient is referred by Dr. Earlie Server for consideration of bronchoscopy to obtain a specific tissue diagnosis, if in fact this is malignancy. I've discussed the proceeding with bronchoscopy under general anesthesia with the patient and his wife to obtain a biopsy and consider endobronchial treatment. Risks and options are discussed with the patient in detail and he is willing to proceed. I've asked him to hold his aspirin until next week at the time of the procedure.  The goals risks and alternatives of the planned surgical procedure Bronchoscopy with biopsy possible endobronchial resection  have been discussed with the patient in detail. The risks of the procedure including death, infection, stroke, myocardial infarction, bleeding, blood transfusion have all been discussed specifically.  I have quoted Marlon Pel Carline a 4 % of perioperative mortality and a complication rate as high as 25 %. The patient's questions have been answered.Irfan Veal Brickner is willing  to proceed with the planned procedure.   [name] MD      [address] [place] [address] Office [phone]   Beeper [phone]  07/04/2014 7:08 AM

## 2014-07-04 NOTE — Transfer of Care (Signed)
Immediate Anesthesia Transfer of Care Note  Patient: Patrick Carr  Procedure(s) Performed: Procedure(s): VIDEO BRONCHOSCOPY WITH  ENDOBRONCIAL BIOPSY (N/A)  Patient Location: PACU  Anesthesia Type:General  Level of Consciousness: awake, alert , oriented and patient cooperative  Airway & Oxygen Therapy: Patient Spontanous Breathing and Patient connected to nasal cannula oxygen  Post-op Assessment: Report given to RN, Post -op Vital signs reviewed and stable, Patient moving all extremities and Patient moving all extremities X 4  Post vital signs: Reviewed and stable  Last Vitals:  Filed Vitals:   07/04/14 0556  BP: 142/90  Pulse: 100  Temp: 36.5 C  Resp: 17    Complications: No apparent anesthesia complications

## 2014-07-04 NOTE — Anesthesia Postprocedure Evaluation (Signed)
Anesthesia Post Note  Patient: Patrick Carr  Procedure(s) Performed: Procedure(s) (LRB): VIDEO BRONCHOSCOPY WITH  ENDOBRONCIAL BIOPSY (N/A)  Anesthesia type: General  Patient location: PACU  Post pain: Pain level controlled and Adequate analgesia  Post assessment: Post-op Vital signs reviewed, Patient's Cardiovascular Status Stable, Respiratory Function Stable, Patent Airway and Pain level controlled  Last Vitals:  Filed Vitals:   07/04/14 0930  BP: 118/62  Pulse: 92  Temp: 36.3 C  Resp: 16    Post vital signs: Reviewed and stable  Level of consciousness: awake, alert  and oriented  Complications: No apparent anesthesia complications

## 2014-07-05 ENCOUNTER — Encounter (HOSPITAL_COMMUNITY): Payer: Self-pay | Admitting: Cardiothoracic Surgery

## 2014-07-20 NOTE — Op Note (Signed)
NAME:  Patrick Carr, Patrick Carr NO.:  [id number]  MEDICAL RECORD NO.:  [id number]  LOCATION:                                 FACILITY:  PHYSICIAN:  Lanelle Bal, MD    DATE OF BIRTH:  19-Mar-1934  DATE OF PROCEDURE:  07/04/2014 DATE OF DISCHARGE:  07/04/2014                              OPERATIVE REPORT   PREOPERATIVE DIAGNOSIS:  Question of tracheal mass.  POSTOPERATIVE DIAGNOSIS:  Question of tracheal mass.  SURGICAL PROCEDURES:  Video bronchoscopy with endobronchial biopsy.  SURGEON:  Lanelle Bal, MD.  BRIEF HISTORY:  The patient is a 81 year old male with known right lung small cell carcinoma of the lung and has undergone treatment.  Followup CT scan of the chest on 2 occasions has raised the issue of possible endobronchial lesion in the distal trachea.  This was initially thought to be secretions, but appeared similar on 2 separate scans and the patient was referred for bronchoscopy.  He agreed and signed informed consent.  DESCRIPTION OF PROCEDURE:  The patient underwent general endotracheal anesthesia.  A single-lumen endotracheal tube was placed.  Appropriate time-out was performed through the endotracheal tube.  Fiberoptic bronchoscope was passed to the subsegmental level both in the right and left tracheobronchial tree without evidence of endobronchial lesion to examine the full trachea carefully in coordination with the anesthesiologist.  The tube was brought back to ensure that we saw the entire trachea in the area in question.  There was a slight bulge of the trachea toward the right side, but no definite endobronchial lesion. The slight area of protrusion was biopsied.  In the patient's brief operative note are multiple photographs of the area in question.  The specimens were submitted to Pathology for permanent section.  The patient tolerated the procedure without complication and was extubated without complication and transferred to the  recovery room for postoperative care.  At this point, we had decided to perform the procedure under general endotracheal anesthesia, in case there was some endobronchial lesion that needed more than biopsy.     Lanelle Bal, MD     EG/MEDQ  D:  07/19/2014  T:  07/20/2014  Job:  [id number]

## 2014-08-11 ENCOUNTER — Encounter (HOSPITAL_COMMUNITY): Payer: Self-pay

## 2014-08-11 ENCOUNTER — Ambulatory Visit (HOSPITAL_COMMUNITY)
Admission: RE | Admit: 2014-08-11 | Discharge: 2014-08-11 | Disposition: A | Discharge status: Home / Self Care | Payer: Medicare Other | Source: Ambulatory Visit | Attending: Physician Assistant | Admitting: Physician Assistant

## 2014-08-11 ENCOUNTER — Other Ambulatory Visit (HOSPITAL_BASED_OUTPATIENT_CLINIC_OR_DEPARTMENT_OTHER): Payer: Medicare Other

## 2014-08-11 ENCOUNTER — Emergency Department (HOSPITAL_COMMUNITY)
Admission: EM | Admit: 2014-08-11 | Discharge: 2014-08-11 | Disposition: A | Discharge status: Home / Self Care | Payer: Medicare Other | Attending: Emergency Medicine | Admitting: Emergency Medicine

## 2014-08-11 ENCOUNTER — Ambulatory Visit: Payer: Medicare Other

## 2014-08-11 VITALS — BP 130/85 | HR 95 | Temp 97.3°F | Resp 20

## 2014-08-11 DIAGNOSIS — C3491 Malignant neoplasm of unspecified part of right bronchus or lung: Secondary | ICD-10-CM

## 2014-08-11 DIAGNOSIS — Z79899 Other long term (current) drug therapy: Secondary | ICD-10-CM | POA: Insufficient documentation

## 2014-08-11 DIAGNOSIS — E785 Hyperlipidemia, unspecified: Secondary | ICD-10-CM | POA: Insufficient documentation

## 2014-08-11 DIAGNOSIS — Z87448 Personal history of other diseases of urinary system: Secondary | ICD-10-CM | POA: Insufficient documentation

## 2014-08-11 DIAGNOSIS — Z87442 Personal history of urinary calculi: Secondary | ICD-10-CM | POA: Insufficient documentation

## 2014-08-11 DIAGNOSIS — I10 Essential (primary) hypertension: Secondary | ICD-10-CM | POA: Diagnosis not present

## 2014-08-11 DIAGNOSIS — R21 Rash and other nonspecific skin eruption: Secondary | ICD-10-CM | POA: Insufficient documentation

## 2014-08-11 DIAGNOSIS — Z95828 Presence of other vascular implants and grafts: Secondary | ICD-10-CM

## 2014-08-11 DIAGNOSIS — Z7982 Long term (current) use of aspirin: Secondary | ICD-10-CM | POA: Diagnosis not present

## 2014-08-11 DIAGNOSIS — Z7952 Long term (current) use of systemic steroids: Secondary | ICD-10-CM | POA: Insufficient documentation

## 2014-08-11 DIAGNOSIS — C3490 Malignant neoplasm of unspecified part of unspecified bronchus or lung: Secondary | ICD-10-CM | POA: Diagnosis not present

## 2014-08-11 DIAGNOSIS — C7A1 Malignant poorly differentiated neuroendocrine tumors: Secondary | ICD-10-CM

## 2014-08-11 DIAGNOSIS — Z85118 Personal history of other malignant neoplasm of bronchus and lung: Secondary | ICD-10-CM | POA: Insufficient documentation

## 2014-08-11 DIAGNOSIS — T7840XA Allergy, unspecified, initial encounter: Secondary | ICD-10-CM | POA: Insufficient documentation

## 2014-08-11 DIAGNOSIS — Z87891 Personal history of nicotine dependence: Secondary | ICD-10-CM | POA: Diagnosis not present

## 2014-08-11 LAB — COMPREHENSIVE METABOLIC PANEL (CC13)
ALBUMIN: 4.1 g/dL (ref 3.5–5.0)
ALT: 17 U/L (ref 0–55)
AST: 23 U/L (ref 5–34)
Alkaline Phosphatase: 75 U/L (ref 40–150)
Anion Gap: 10 mEq/L (ref 3–11)
BUN: 22.6 mg/dL (ref 7.0–26.0)
CO2: 26 mEq/L (ref 22–29)
CREATININE: 0.9 mg/dL (ref 0.7–1.3)
Calcium: 9.2 mg/dL (ref 8.4–10.4)
Chloride: 105 mEq/L (ref 98–109)
EGFR: 83 mL/min/{1.73_m2} — AB (ref >90)
Glucose: 111 mg/dl (ref 70–140)
Potassium: 3.8 mEq/L (ref 3.5–5.1)
Sodium: 142 mEq/L (ref 136–145)
Total Bilirubin: 0.75 mg/dL (ref 0.20–1.20)
Total Protein: 6.7 g/dL (ref 6.4–8.3)

## 2014-08-11 LAB — CBC WITH DIFFERENTIAL/PLATELET
BASO%: 0.3 % (ref 0.0–2.0)
Basophils Absolute: 0 10*3/uL (ref 0.0–0.1)
EOS ABS: 0 10*3/uL (ref 0.0–0.5)
EOS%: 1.1 % (ref 0.0–7.0)
HEMATOCRIT: 38.7 % (ref 38.4–49.9)
HGB: 13 g/dL (ref 13.0–17.1)
LYMPH%: 12.7 % — AB (ref 14.0–49.0)
MCH: 33.6 pg — ABNORMAL HIGH (ref 27.2–33.4)
MCHC: 33.6 g/dL (ref 32.0–36.0)
MCV: 100 fL — ABNORMAL HIGH (ref 79.3–98.0)
MONO#: 0.5 10*3/uL (ref 0.1–0.9)
MONO%: 12.7 % (ref 0.0–14.0)
NEUT%: 73.2 % (ref 39.0–75.0)
NEUTROS ABS: 2.8 10*3/uL (ref 1.5–6.5)
PLATELETS: 109 10*3/uL — AB (ref 140–400)
RBC: 3.87 10*6/uL — ABNORMAL LOW (ref 4.20–5.82)
RDW: 12.4 % (ref 11.0–14.6)
WBC: 3.8 10*3/uL — ABNORMAL LOW (ref 4.0–10.3)
lymph#: 0.5 10*3/uL — ABNORMAL LOW (ref 0.9–3.3)

## 2014-08-11 LAB — MAGNESIUM (CC13): MAGNESIUM: 2.2 mg/dL (ref 1.5–2.5)

## 2014-08-11 MED ORDER — SODIUM CHLORIDE 0.9 % IJ SOLN
10.0000 mL | INTRAMUSCULAR | Status: DC | PRN
  Administered 2014-08-11: 10 mL via INTRAVENOUS
  Filled 2014-08-11: qty 10

## 2014-08-11 MED ORDER — FAMOTIDINE 20 MG PO TABS: 20.0000 mg | ORAL_TABLET | Freq: Two times a day (BID) | ORAL | Status: DC

## 2014-08-11 MED ORDER — DIPHENHYDRAMINE HCL 25 MG PO TABS: 25.0000 mg | ORAL_TABLET | Freq: Four times a day (QID) | ORAL | Status: DC | PRN

## 2014-08-11 MED ORDER — IOHEXOL 300 MG/ML  SOLN
80.0000 mL | Freq: Once | INTRAMUSCULAR | Status: AC | PRN
  Administered 2014-08-11: 80 mL via INTRAVENOUS

## 2014-08-11 MED ORDER — PREDNISONE 20 MG PO TABS: 40.0000 mg | ORAL_TABLET | Freq: Every day | ORAL | Status: DC

## 2014-08-11 MED ORDER — HEPARIN SOD (PORK) LOCK FLUSH 100 UNIT/ML IV SOLN
500.0000 [IU] | Freq: Once | INTRAVENOUS | Status: AC
Start: 2014-08-11 — End: 2014-08-11
  Administered 2014-08-11: 500 [IU] via INTRAVENOUS
  Filled 2014-08-11: qty 5

## 2014-08-11 NOTE — ED Provider Notes (Signed)
CSN: [id number]     Arrival date & time 08/11/14  1857 History   First MD Initiated Contact with Patient 08/11/14 1906     Chief Complaint  Patient presents with  . Allergic Reaction     (Consider location/radiation/quality/duration/timing/severity/associated sxs/prior Treatment) HPI Comments: The patient is a 81 year old male, history of lung cancer, has been getting a CT scan earlier today, a short time after the CT scan he developed redness and rash and itching across his upper torso and neck, this has been persistent, mild to moderate, not associated with shortness of breath or breathing difficulties. No history of allergic reaction, no other medications.  Patient is a 81 y.o. male presenting with allergic reaction. The history is provided by the patient.  Allergic Reaction   Past Medical History  Diagnosis Date  . Hypertension   . Hyperlipidemia   . BPH (benign prostatic hyperplasia)   . Lesion of right lung     RLL  . RBBB     no palpations  . Renal cysts, acquired, bilateral   . History of kidney stones   . Hernia, inguinal left   . History of blood transfusion     artery in sinus bleed  . Phlebitis 03/20/14    bilateral forearms  . Radiation 02/08/14-03/21/14    Stage III lung cancer 30 fractions  . Irregular heart rate   . Cancer     Lung   Past Surgical History  Procedure Laterality Date  . Sinus artery surgery      not surgery  . No past surgeries    . Video bronchoscopy with endobronchial navigation N/A 01/18/2014    Procedure: VIDEO BRONCHOSCOPY WITH ENDOBRONCHIAL NAVIGATION;  Surgeon: [name], MD;  Location[facility] OR;  Service: Thoracic;  Laterality: N/A;  . Video bronchoscopy with endobronchial ultrasound N/A 01/18/2014    Procedure: VIDEO BRONCHOSCOPY WITH ENDOBRONCHIAL ULTRASOUND;  Surgeon: [name], MD;  Location: [facility];  Service: Thoracic;  Laterality: N/A;  . Video bronchoscopy N/A 07/04/2014    Procedure: VIDEO BRONCHOSCOPY WITH   ENDOBRONCIAL BIOPSY;  Surgeon: [name], MD;  Location: [facility] OR;  Service: Thoracic;  Laterality: N/A;   Family History  Problem Relation Age of Onset  . Heart disease Mother   . Cancer Father    History  Substance Use Topics  . Smoking status: Former Smoker -- 0.50 packs/day for 5 years    Quit date: 01/29/2014  . Smokeless tobacco: Never Used  . Alcohol Use: No    Review of Systems  All other systems reviewed and are negative.     Allergies  Contrast media; Other; and Pneumococcal vaccines  Home Medications   Prior to Admission medications   Medication Sig Start Date End Date Taking? Authorizing Provider  alfuzosin (UROXATRAL) 10 MG 24 hr tablet Take 10 mg by mouth at bedtime.    Yes Historical Provider, MD  ALPRAZolam Duanne Moron) 0.25 MG tablet Take 0.25 mg by mouth at bedtime as needed for anxiety.   Yes Historical Provider, MD  amLODipine (NORVASC) 5 MG tablet Take 5 mg by mouth at bedtime.    Yes Historical Provider, MD  aspirin 81 MG tablet Take 81 mg by mouth daily.   Yes Historical Provider, MD  atorvastatin (LIPITOR) 40 MG tablet Take 40 mg by mouth at bedtime.    Yes Historical Provider, MD  tamsulosin (FLOMAX) 0.4 MG CAPS capsule Take 0.4 mg by mouth daily.   Yes Historical Provider, MD  cephALEXin [facility])  500 MG capsule Take 1 capsule (500 mg total) by mouth 4 (four) times daily. Patient not taking: Reported on 06/29/2014 05/01/14   Susanne Borders, NP  diphenhydrAMINE (BENADRYL) 25 MG tablet Take 1 tablet (25 mg total) by mouth every 6 (six) hours as needed for itching (Rash). 08/11/14   Noemi Chapel, MD  famotidine (PEPCID) 20 MG tablet Take 1 tablet (20 mg total) by mouth 2 (two) times daily. 08/11/14   Noemi Chapel, MD  lidocaine-prilocaine (EMLA) cream Apply 1 application topically as needed. Apply over port site 1 hour prior to chemo. Do not rub in medicine. 04/11/14   [name], MD  predniSONE (DELTASONE) 20 MG tablet Take 2 tablets (40 mg total) by  mouth daily. 08/11/14   Noemi Chapel, MD  prochlorperazine (COMPAZINE) 10 MG tablet Take 1 tablet (10 mg total) by mouth every 6 (six) hours as needed for nausea or vomiting. Patient not taking: Reported on 06/29/2014 01/28/14   [name], MD   BP 133/74 mmHg  Pulse 97  Temp(Src) 97.9 F (36.6 C) (Oral)  Resp 18  SpO2 96% Physical Exam  Constitutional: He appears well-developed and well-nourished. No distress.  HENT:  Head: Normocephalic and atraumatic.  Mouth/Throat: Oropharynx is clear and moist. No oropharyngeal exudate.  No oropharyngeal swelling, mucous members are moist, phonation is normal  Eyes: Conjunctivae and EOM are normal. Pupils are equal, round, and reactive to light. Right eye exhibits no discharge. Left eye exhibits no discharge. No scleral icterus.  Neck: Normal range of motion. Neck supple. No JVD present. No thyromegaly present.  Cardiovascular: Normal rate, regular rhythm, normal heart sounds and intact distal pulses.  Exam reveals no gallop and no friction rub.   No murmur heard. Pulmonary/Chest: Effort normal and breath sounds normal. No respiratory distress. He has no wheezes. He has no rales.  Port present in the right upper chest, no surrounding induration. Lungs are clear to auscultation bilaterally, no wheezing  Abdominal: Soft. Bowel sounds are normal. He exhibits no distension and no mass. There is no tenderness.  Musculoskeletal: Normal range of motion. He exhibits no edema or tenderness.  Lymphadenopathy:    He has no cervical adenopathy.  Neurological: He is alert. Coordination normal.  Skin: Skin is warm and dry. Rash noted. There is erythema (mild erythematous rash across the bilateral upper trunk and upper back, no discrete urticarial lesions, no petechiae or purpura).  Psychiatric: He has a normal mood and affect. His behavior is normal.  Nursing note and vitals reviewed.   ED Course  Procedures (including critical care time) Labs Review Labs  Reviewed - No data to display  Imaging Review Ct Chest W Contrast  08/11/2014   CLINICAL DATA:  Subsequent encounter for lung cancer.  EXAM: CT CHEST WITH CONTRAST  TECHNIQUE: Multidetector CT imaging of the chest was performed during intravenous contrast administration.  CONTRAST:  16mL OMNIPAQUE IOHEXOL 300 MG/ML  SOLN  COMPARISON:  06/09/2014.  FINDINGS: Mediastinum / Lymph Nodes: The tip of the right-sided Port-A-Cath is positioned at the junction of the SVC and RA. There is no axillary lymphadenopathy. Tiny left thyroid nodule is unchanged. No mediastinal or hilar lymphadenopathy. Heart size is normal. There is trace anterior pericardial fluid or thickening.  Lungs / Pleura: Changes of centrilobular emphysema are again noted bilaterally. Anterior right lower lobe lesion in question measures 8 x 5 mm today compared to 11 x 4 mm previously.  Pleural-parenchymal scarring in the right apex is stable. There is no new  pulmonary parenchymal nodule or mass. Linear scarring is seen in the posterior lower lobes bilaterally. Stable scarring in the posterior lingula. No pulmonary edema or pleural effusion.  The intra tracheal lesions seen previously is no longer evident.  MSK / Soft Tissues: Bone windows reveal no worrisome lytic or sclerotic osseous lesions.  Upper Abdomen: Scattered tiny hypodensities in the liver parenchyma are unchanged and probably represent cysts. Small low-density lesions in the visualized portions of the kidneys are unchanged in are also probably cysts. There is no adrenal nodule or mass.  IMPRESSION: Slight interval decrease in size of the right lower lobe nodule. No new or progressive disease in the chest.  Into tracheal polypoid lesion seen on the previous study is no longer evident.  Emphysema.   Electronically Signed   By: Misty Stanley M.D.   On: 08/11/2014 11:09      MDM   Final diagnoses:  Allergic reaction, initial encounter    The patient was given results of his CT scan,  this shows improvement in the size of the lung nodule, I suspect he is having an allergic reaction though it is mild, does not involve his oral or pharyngeal airways, lungs are clear, skin is red, he is not in distress and his vital signs are normal. Medications as below   Meds given in ED:  Medications - No data to display  New Prescriptions   DIPHENHYDRAMINE (BENADRYL) 25 MG TABLET    Take 1 tablet (25 mg total) by mouth every 6 (six) hours as needed for itching (Rash).   FAMOTIDINE (PEPCID) 20 MG TABLET    Take 1 tablet (20 mg total) by mouth 2 (two) times daily.   PREDNISONE (DELTASONE) 20 MG TABLET    Take 2 tablets (40 mg total) by mouth daily.        Noemi Chapel, MD 08/11/14 [phone]

## 2014-08-11 NOTE — ED Notes (Signed)
Pt had CT dye today at around 10 am.  Started having reaction at 2pm.  Called Md and told to come here.  Pt is red over entire body.  No shortness of breath.

## 2014-08-11 NOTE — ED Notes (Signed)
Pt is c/o generalized redness and itching.  Pt is speaking in full sentences, respirations are even and unlabored.

## 2014-08-11 NOTE — Patient Instructions (Signed)

## 2014-08-11 NOTE — Discharge Instructions (Signed)
You have been diagnosed with an allergic reaction. Usually allergic reactions like this are caused by exposures to something that you either ate or touched or smelled.  It may be related to a number of different exposures including a new perfume, topical creams, soaps, detergents, linens, clothing, medications. Occasionally we do not find an answer for why there is an allergic reaction. These are treated the same way including Benadryl as needed for itching and rash. (This can be used up to 50 mg every 6 hours as needed).  Pepcid 20 mg every night and prednisone once a day for 5 days. Please do not take the Benadryl and drive or take care of children or other imported duties as the Benadryl can make you sleepy.    If you should develop severe or worsening symptoms including difficulty breathing, difficulty swallowing, wheezing or increased coughing or a rash that developed on the inside of your mouth or a worsening rash on your skin, return to the hospital immediately for a recheck. Please call your Dr. in the morning for a recheck in 2 days if you are still having symptoms. If you do not have a Dr. see the list below.  If we have identified the source of your allergic reaction, please avoid this at all costs. This means stopping the medication if it is a new medication or a voiding topical exposures such as creams lotions body soaps or deodorants if this is the source.  Allergic Reaction, Mild to Moderate Allergies may happen from anything your body is sensitive to. This may be food, medications, pollens, chemicals, and nearly anything around you in everyday life that produces allergens. An allergen is anything that causes an allergy producing substance. Allergens cause your body to release allergic antibodies. Through a chain of events, they cause a release of histamine into the blood stream. Histamines are meant to protect you, but they also cause your discomfort. This is why antihistamines are often used  for allergies. Heredity is often a factor in causing allergic reactions. This means you may have some of the same allergies as your parents. Allergies happen in all age groups. You may have some idea of what caused your reaction. There are many allergens around [place]. It may be difficult to know what caused your reaction. If this is a first time event, it may never happen again. Allergies cannot be cured but can be controlled with medications. SYMPTOMS  You may get some or all of the following problems from allergies.  Swelling and itching in and around the mouth.   Tearing, itchy eyes.   Nasal congestion and runny nose.   Sneezing and coughing.   An itchy red rash or hives.   Vomiting or diarrhea.   Difficulty breathing.  Seasonal allergies occur in all age groups. They are seasonal because they usually occur during the same season every year. They may be a reaction to molds, grass pollens, or tree pollens. Other causes of allergies are house dust mite allergens, pet dander and mold spores. These are just a common few of the thousands of allergens around [place]. All of the symptoms listed above happen when you come in contact with pollens and other allergens. Seasonal allergies are usually not life threatening. They are generally more of a nuisance that can often be handled using medications. Hay fever is a combination of all or some of the above listed allergy problems. It may often be treated with simple over-the-counter medications such as diphenhydramine. Take medication as  directed. Check with your caregiver or package insert for child dosages. TREATMENT AND HOME CARE INSTRUCTIONS If hives or rash are present:  Take medications as directed.   You may use an over-the-counter antihistamine (diphenhydramine) for hives and itching as needed. Do not drive or drink alcohol until medications used to treat the reaction have worn off. Antihistamines tend to make people sleepy.   Apply cold cloths  (compresses) to the skin or take baths in cool water. This will help itching. Avoid hot baths or showers. Heat will make a rash and itching worse.   If your allergies persist and become more severe, and over the counter medications are not effective, there are many new medications your caretaker can prescribe. Immunotherapy or desensitizing injections can be used if all else fails. Follow up with your caregiver if problems continue.  SEEK MEDICAL CARE IF:   Your allergies are becoming progressively more troublesome.   You suspect a food allergy. Symptoms generally happen within 30 minutes of eating a food.   Your symptoms have not gone away within 2 days or are getting worse.   You develop new symptoms.   You want to retest yourself or your child with a food or drink you think causes an allergic reaction. Never test yourself or your child of a suspected allergy without being under the watchful eye of your caregivers. A second exposure to an allergen may be life-threatening.  SEEK IMMEDIATE MEDICAL CARE IF:  You develop difficulty breathing or wheezing, or have a tight feeling in your chest or throat.   You develop a swollen mouth, hives, swelling, or itching all over your body.  A severe reaction with any of the above problems should be considered life-threatening. If you suddenly develop difficulty breathing call for local emergency medical help. THIS IS AN EMERGENCY. MAKE SURE YOU:   Understand these instructions.   Will watch your condition.   Will get help right away if you are not doing well or get worse.  Document Released: 03/09/2007 Document Revised: 05/01/2011 Document Reviewed: 03/09/2007 [facility] Patient Information 2012 [place].  RESOURCE GUIDE  Chronic Pain Problems: Contact [facility]  [phone] Patients need to be referred by their primary care doctor.  Insufficient Money for Medicine: Contact [employer] Way:  call "211" or [place] [phone].  No Primary Care Doctor: - Call [facility]  [phone] - can help you locate a primary care doctor that  accepts your insurance, provides certain services, etc. - Physician Referral [phone]  Agencies that provide inexpensive medical care: - [facility]  [facility]  [phone] - [facility]  [phone] - [facility]  [phone] - Planned Parenthood  [facility]  [phone]  [place] Providers: - [facility]- [address]  [phone], Mon-Fri 9am-7pm, Sat 9am-1pm - [facility]- [place], [place], [address]  [place]- [address]  [place], [address], [phone]  Only accepts [place] patients after they have their name  applied to their card  Self Pay (no insurance) in [place]: - Sickle Cell Patients: Dr Kevan Ny, [facility]  [place], [facility]- [address]  [facility] [place]- [address], [facility]- see information above (Speak to [employer] if you do not have insurance)       -  [facility]- [place], [place],  [place], [place]  Dr Vista Lawman-  [address], [place], [facility]- [address], [phone]       -  [facility] [place]- [address], [place], also [address], [phone]       -    [facility]- [address], [place], 1st & 3rd Saturday   every month,  10am-1pm  1) Find a Doctor and Pay Out of Pocket Although you won't have to find out who is covered by your insurance plan, it is a good idea to ask around and get recommendations. You will then need to call the office and see if the doctor you have chosen will accept you as a new patient and what types of options they offer for patients who are self-pay. Some doctors offer discounts or will set up payment plans for their patients who do not have insurance, but you will need to ask so you aren't surprised when you get to your appointment.  2) Contact Your Local Health Department Not all health departments have doctors that can see patients for sick visits, but many do, so it is worth a call to see if yours does. If you don't know where your local health department is, you can check in your phone book. The CDC also has a tool to help you locate your state's health department, and many state websites also have listings of all of their local health departments.  3) Find a [facility] If your illness is not likely to be very severe or complicated, you may want to try a walk in clinic. These are popping up all over the country in pharmacies, drugstores, and shopping centers. They're usually staffed by nurse practitioners or physician assistants that have been trained to treat common illnesses and complaints. They're usually fairly quick and inexpensive. However, if you have serious medical issues or chronic medical problems, these are probably not your best option  STD Testing - [place], [facility], [address] [place], phone [phone] or [phone].  Monday - Friday, call for an appointment. - [place], [facility], [address], [place], phone [phone] or [phone].  Monday - Friday, call for an appointment.  Abuse/Neglect: - [place] [phone] - [place] [phone] (After Hours)  Emergency Shelter:  [facility] [phone]  Maternity Homes: - [facility] [place] [phone] - [place] [phone]  MRSA Hotline #:   9164589286  [facility] Dept. [address]  [place] Phone:  [phone]                                  Phone:  [phone]                   Phone:  [place], [place] in [place], [address]                                  [phone], [place] [phone] or [phone] (After Hours)   [place]  Substance Abuse Resources: - Alcohol and Drug Services  [phone] - [place] [phone] - [facility] [place] [phone] - Residential & Outpatient Substance Abuse Program  [phone]  Psychological Services: - [place]  [facility]  [place], [phone] [place]. [address] [place], [place]: [phone] or [phone], [employer]  Dental Assistance  If unable to pay or uninsured, contact:  [facility] or [facility]. to become qualified for the adult dental clinic.  Patients with Medicaid: [facility] [phone] [address], [place] [address], [phone]  If unable to pay, or uninsured, contact [facility] [phone]) or [place] [phone] in [place], [place] in [facility]) to become qualified for the adult dental clinic  Other [place]- [place], [place], [place], [address], [place], [place], 2nd and 4th Thursday of the month at 6:30am.  10 clients each day by appointment, can sometimes see walk-in patients if someone does not show for an appointment. [facility]- [address] [place], [place], [address], [place], [place], [place], [address], [place] Department- [place] Department- [place] Department- [phone]

## 2014-08-15 ENCOUNTER — Other Ambulatory Visit: Payer: Medicare Other

## 2014-08-15 ENCOUNTER — Encounter: Payer: Self-pay | Admitting: Internal Medicine

## 2014-08-15 ENCOUNTER — Ambulatory Visit (HOSPITAL_BASED_OUTPATIENT_CLINIC_OR_DEPARTMENT_OTHER): Payer: Medicare Other | Admitting: Internal Medicine

## 2014-08-15 ENCOUNTER — Telehealth: Payer: Self-pay | Admitting: Internal Medicine

## 2014-08-15 VITALS — BP 107/61 | HR 98 | Temp 97.7°F | Resp 17 | Ht 65.0 in | Wt 125.7 lb

## 2014-08-15 DIAGNOSIS — C7A1 Malignant poorly differentiated neuroendocrine tumors: Secondary | ICD-10-CM | POA: Diagnosis not present

## 2014-08-15 DIAGNOSIS — C3491 Malignant neoplasm of unspecified part of right bronchus or lung: Secondary | ICD-10-CM

## 2014-08-15 NOTE — Telephone Encounter (Signed)
Gave avs & calendar for May thru Nov.

## 2014-08-15 NOTE — Progress Notes (Signed)
[place] Telephone:(336) [phone]   Fax:(336) [phone]  OFFICE PROGRESS NOTE  [name], MD [address] [place] [address]  DIAGNOSIS: Limited stage small cell lung cancer, stage IIA (T1a, N1, M0) presented with right lower lobe nodule in addition to right hilar lymphadenopathy diagnosed in August of 2015.  PRIOR THERAPY: Systemic chemotherapy with cisplatin 60 mg/M2 on day 1 and etoposide at 120 mg/M2 on days 1, 2 and 3 with Neulasta support on day 4. Status post 6 cycles with partial response. He is not interested in prophylactic cranial irradiation after discussion with Dr. [name].  CURRENT THERAPY: Observation.  INTERVAL HISTORY: Patrick Carr 81 y.o. male returns to the clinic today for followup visit accompanied by his wife. The patient is feeling fine today with no specific complaints. He was seen by Dr. Servando Snare for evaluation of the questionable endobronchial lesion and the biopsy showed no evidence for malignancy. He denied having any fever or chills. He has no nausea or vomiting. He denied having any significant chest pain, shortness of breath, cough or hemoptysis. He has no significant weight loss or night sweats. He had repeat CT scan of the chest performed recently and he is here for evaluation and discussion of his scan results.  MEDICAL HISTORY: Past Medical History  Diagnosis Date  . Hypertension   . Hyperlipidemia   . BPH (benign prostatic hyperplasia)   . Lesion of right lung     RLL  . RBBB     no palpations  . Renal cysts, acquired, bilateral   . History of kidney stones   . Hernia, inguinal left   . History of blood transfusion     artery in sinus bleed  . Phlebitis 03/20/14    bilateral forearms  . Radiation 02/08/14-03/21/14    Stage III lung cancer 30 fractions  . Irregular heart rate   . Cancer     Lung    ALLERGIES:  is allergic to contrast media; other; and pneumococcal  vaccines.  MEDICATIONS:  Current Outpatient Prescriptions  Medication Sig Dispense Refill  . alfuzosin (UROXATRAL) 10 MG 24 hr tablet Take 10 mg by mouth at bedtime.     . ALPRAZolam (XANAX) 0.25 MG tablet Take 0.25 mg by mouth at bedtime as needed for anxiety.    [name] amLODipine (NORVASC) 5 MG tablet Take 5 mg by mouth at bedtime.     [name] aspirin 81 MG tablet Take 81 mg by mouth at bedtime.     [name] atorvastatin (LIPITOR) 40 MG tablet Take 40 mg by mouth at bedtime.     . diphenhydrAMINE (BENADRYL) 25 MG tablet Take 1 tablet (25 mg total) by mouth every 6 (six) hours as needed for itching (Rash). 30 tablet 0  . famotidine (PEPCID) 20 MG tablet Take 1 tablet (20 mg total) by mouth 2 (two) times daily. 30 tablet 0  . lidocaine-prilocaine (EMLA) cream Apply 1 application topically as needed. Apply over port site 1 hour prior to chemo. Do not rub in medicine. 30 g 0  . predniSONE (DELTASONE) 20 MG tablet Take 2 tablets (40 mg total) by mouth daily. 10 tablet 0  . tamsulosin (FLOMAX) 0.4 MG CAPS capsule Take 0.4 mg by mouth at bedtime.     . prochlorperazine (COMPAZINE) 10 MG tablet Take 1 tablet (10 mg total) by mouth every 6 (six) hours as needed for nausea or vomiting. (Patient not taking: Reported on 06/29/2014) 30 tablet 0  No current facility-administered medications for this visit.   Facility-Administered Medications Ordered in Other Visits  Medication Dose Route Frequency Provider Last Rate Last Dose  . sodium chloride 0.9 % injection 10 mL  10 mL Intravenous PRN [name], MD   10 mL at 05/23/14 1635    SURGICAL HISTORY:  Past Surgical History  Procedure Laterality Date  . Sinus artery surgery      not surgery  . No past surgeries    . Video bronchoscopy with endobronchial navigation N/A 01/18/2014    Procedure: VIDEO BRONCHOSCOPY WITH ENDOBRONCHIAL NAVIGATION;  Surgeon: [name], MD;  Location: [facility] OR;  Service: Thoracic;  Laterality: N/A;  . Video bronchoscopy with  endobronchial ultrasound N/A 01/18/2014    Procedure: VIDEO BRONCHOSCOPY WITH ENDOBRONCHIAL ULTRASOUND;  Surgeon: [name], MD;  Location: [place];  Service: Thoracic;  Laterality: N/A;  . Video bronchoscopy N/A 07/04/2014    Procedure: VIDEO BRONCHOSCOPY WITH  ENDOBRONCIAL BIOPSY;  Surgeon: [name], MD;  Location: [place];  Service: Thoracic;  Laterality: N/A;    REVIEW OF SYSTEMS:  A comprehensive review of systems was negative.   PHYSICAL EXAMINATION: General appearance: alert, cooperative and no distress Head: Normocephalic, without obvious abnormality, atraumatic Neck: no adenopathy, no JVD, supple, symmetrical, trachea midline and thyroid not enlarged, symmetric, no tenderness/mass/nodules Lymph nodes: Cervical, supraclavicular, and axillary nodes normal. Resp: clear to auscultation bilaterally Back: symmetric, no curvature. ROM normal. No CVA tenderness. Cardio: regular rate and rhythm, S1, S2 normal, no murmur, click, rub or gallop GI: soft, non-tender; bowel sounds normal; no masses,  no organomegaly Extremities: extremities normal, atraumatic, no cyanosis or edema  ECOG PERFORMANCE STATUS: 1 - Symptomatic but completely ambulatory  Blood pressure 107/61, pulse 98, temperature 97.7 F (36.5 C), temperature source Oral, resp. rate 17, height 5\' 5"  (1.651 m), weight 125 lb 11.2 oz (57.017 kg), SpO2 96 %.  LABORATORY DATA: Lab Results  Component Value Date   WBC 3.8* 08/11/2014   HGB 13.0 08/11/2014   HCT 38.7 08/11/2014   MCV 100.0* 08/11/2014   PLT 109* 08/11/2014      Chemistry      Component Value Date/Time   NA 142 08/11/2014 0908   NA 140 07/03/2014 1036   K 3.8 08/11/2014 0908   K 4.1 07/03/2014 1036   CL 106 07/03/2014 1036   CO2 26 08/11/2014 0908   CO2 26 07/03/2014 1036   BUN 22.6 08/11/2014 0908   BUN 27* 07/03/2014 1036   CREATININE 0.9 08/11/2014 0908   CREATININE 0.93 07/03/2014 1036   CREATININE 0.80 01/12/2014 1519      Component  Value Date/Time   CALCIUM 9.2 08/11/2014 0908   CALCIUM 9.6 07/03/2014 1036   ALKPHOS 75 08/11/2014 0908   ALKPHOS 64 07/03/2014 1036   AST 23 08/11/2014 0908   AST 24 07/03/2014 1036   ALT 17 08/11/2014 0908   ALT 16 07/03/2014 1036   BILITOT 0.75 08/11/2014 0908   BILITOT 0.8 07/03/2014 1036       RADIOGRAPHIC STUDIES: Ct Chest W Contrast  08/11/2014   CLINICAL DATA:  Subsequent encounter for lung cancer.  EXAM: CT CHEST WITH CONTRAST  TECHNIQUE: Multidetector CT imaging of the chest was performed during intravenous contrast administration.  CONTRAST:  35mL OMNIPAQUE IOHEXOL 300 MG/ML  SOLN  COMPARISON:  06/09/2014.  FINDINGS: Mediastinum / Lymph Nodes: The tip of the right-sided Port-A-Cath is positioned at the junction of the SVC and RA. There is no axillary lymphadenopathy.  Tiny left thyroid nodule is unchanged. No mediastinal or hilar lymphadenopathy. Heart size is normal. There is trace anterior pericardial fluid or thickening.  Lungs / Pleura: Changes of centrilobular emphysema are again noted bilaterally. Anterior right lower lobe lesion in question measures 8 x 5 mm today compared to 11 x 4 mm previously.  Pleural-parenchymal scarring in the right apex is stable. There is no new pulmonary parenchymal nodule or mass. Linear scarring is seen in the posterior lower lobes bilaterally. Stable scarring in the posterior lingula. No pulmonary edema or pleural effusion.  The intra tracheal lesions seen previously is no longer evident.  MSK / Soft Tissues: Bone windows reveal no worrisome lytic or sclerotic osseous lesions.  Upper Abdomen: Scattered tiny hypodensities in the liver parenchyma are unchanged and probably represent cysts. Small low-density lesions in the visualized portions of the kidneys are unchanged in are also probably cysts. There is no adrenal nodule or mass.  IMPRESSION: Slight interval decrease in size of the right lower lobe nodule. No new or progressive disease in the chest.   Into tracheal polypoid lesion seen on the previous study is no longer evident.  Emphysema.   Electronically Signed   By: Misty Stanley M.D.   On: 08/11/2014 11:09    ASSESSMENT AND PLAN: This is a very pleasant 81 years old white male recently diagnosed with limited stage small cell lung cancer and currently undergoing systemic chemotherapy with cisplatin and etoposide status post 6 cycle. He tolerated his treatment fairly well with no significant adverse effects. He has been observation for the last 2 months. The recent CT scan of the chest showed no evidence for disease progression. I discussed the scan results with the patient and his wife. I recommended for him to continue on observation with repeat CT scan of the chest in 3 months. He will continue to have Port-A-Cath flush every 6 weeks. He was advised to call immediately if he has any concerning symptoms in the interval. The patient voices understanding of current disease status and treatment options and is in agreement with the current care plan.  All questions were answered. The patient knows to call the clinic with any problems, questions or concerns. We can certainly see the patient much sooner if necessary.  Disclaimer: This note was dictated with voice recognition software. Similar sounding words can inadvertently be transcribed and may not be corrected upon review.

## 2014-09-26 ENCOUNTER — Ambulatory Visit (HOSPITAL_BASED_OUTPATIENT_CLINIC_OR_DEPARTMENT_OTHER): Payer: Medicare Other

## 2014-09-26 VITALS — BP 131/76 | HR 95 | Temp 97.7°F | Resp 20

## 2014-09-26 DIAGNOSIS — C7A1 Malignant poorly differentiated neuroendocrine tumors: Secondary | ICD-10-CM

## 2014-09-26 DIAGNOSIS — Z452 Encounter for adjustment and management of vascular access device: Secondary | ICD-10-CM

## 2014-09-26 DIAGNOSIS — Z95828 Presence of other vascular implants and grafts: Secondary | ICD-10-CM

## 2014-09-26 MED ORDER — SODIUM CHLORIDE 0.9 % IJ SOLN
10.0000 mL | INTRAMUSCULAR | Status: DC | PRN
  Administered 2014-09-26: 10 mL via INTRAVENOUS
  Filled 2014-09-26: qty 10

## 2014-09-26 MED ORDER — HEPARIN SOD (PORK) LOCK FLUSH 100 UNIT/ML IV SOLN
500.0000 [IU] | Freq: Once | INTRAVENOUS | Status: AC
  Administered 2014-09-26: 500 [IU] via INTRAVENOUS
  Filled 2014-09-26: qty 5

## 2014-09-26 NOTE — Patient Instructions (Signed)

## 2014-10-11 ENCOUNTER — Encounter: Payer: Self-pay | Admitting: Nurse Practitioner

## 2014-10-12 ENCOUNTER — Other Ambulatory Visit: Payer: Self-pay | Admitting: Medical Oncology

## 2014-10-16 ENCOUNTER — Telehealth: Payer: Self-pay | Admitting: Internal Medicine

## 2014-10-16 ENCOUNTER — Ambulatory Visit (HOSPITAL_BASED_OUTPATIENT_CLINIC_OR_DEPARTMENT_OTHER): Payer: Medicare Other

## 2014-10-16 DIAGNOSIS — Z95828 Presence of other vascular implants and grafts: Secondary | ICD-10-CM

## 2014-10-16 DIAGNOSIS — Z452 Encounter for adjustment and management of vascular access device: Secondary | ICD-10-CM | POA: Diagnosis not present

## 2014-10-16 DIAGNOSIS — C7A1 Malignant poorly differentiated neuroendocrine tumors: Secondary | ICD-10-CM | POA: Diagnosis not present

## 2014-10-16 MED ORDER — SODIUM CHLORIDE 0.9 % IJ SOLN
10.0000 mL | INTRAMUSCULAR | Status: DC | PRN
  Administered 2014-10-16: 10 mL via INTRAVENOUS
  Filled 2014-10-16: qty 10

## 2014-10-16 MED ORDER — HEPARIN SOD (PORK) LOCK FLUSH 100 UNIT/ML IV SOLN
500.0000 [IU] | Freq: Once | INTRAVENOUS | Status: AC
Start: 2014-10-16 — End: 2014-10-16
  Administered 2014-10-16: 500 [IU] via INTRAVENOUS
  Filled 2014-10-16: qty 5

## 2014-10-16 NOTE — Telephone Encounter (Signed)
[facility] she said it is ok for pt to come today for flush

## 2014-10-16 NOTE — Patient Instructions (Signed)

## 2014-10-16 NOTE — Telephone Encounter (Signed)
returned call and s.w. pt and he says that he missed his 5.3 flush....i r/s appt to today and sent msg to [facility] to review 5.3 flush appt saying completed

## 2014-11-14 ENCOUNTER — Ambulatory Visit (HOSPITAL_COMMUNITY)
Admission: RE | Admit: 2014-11-14 | Discharge: 2014-11-14 | Disposition: A | Discharge status: Home / Self Care | Payer: Medicare Other | Source: Ambulatory Visit | Attending: Internal Medicine | Admitting: Internal Medicine

## 2014-11-14 ENCOUNTER — Other Ambulatory Visit (HOSPITAL_BASED_OUTPATIENT_CLINIC_OR_DEPARTMENT_OTHER): Payer: Medicare Other

## 2014-11-14 ENCOUNTER — Ambulatory Visit: Payer: Medicare Other

## 2014-11-14 VITALS — BP 142/89 | HR 95 | Temp 97.7°F | Resp 18

## 2014-11-14 DIAGNOSIS — C7A1 Malignant poorly differentiated neuroendocrine tumors: Secondary | ICD-10-CM | POA: Diagnosis not present

## 2014-11-14 DIAGNOSIS — Z9221 Personal history of antineoplastic chemotherapy: Secondary | ICD-10-CM | POA: Insufficient documentation

## 2014-11-14 DIAGNOSIS — R911 Solitary pulmonary nodule: Secondary | ICD-10-CM | POA: Diagnosis not present

## 2014-11-14 DIAGNOSIS — Z923 Personal history of irradiation: Secondary | ICD-10-CM | POA: Insufficient documentation

## 2014-11-14 DIAGNOSIS — C3491 Malignant neoplasm of unspecified part of right bronchus or lung: Secondary | ICD-10-CM | POA: Insufficient documentation

## 2014-11-14 DIAGNOSIS — Z95828 Presence of other vascular implants and grafts: Secondary | ICD-10-CM

## 2014-11-14 DIAGNOSIS — J9809 Other diseases of bronchus, not elsewhere classified: Secondary | ICD-10-CM | POA: Diagnosis not present

## 2014-11-14 LAB — CBC WITH DIFFERENTIAL/PLATELET
BASO%: 0.4 % (ref 0.0–2.0)
Basophils Absolute: 0 10*3/uL (ref 0.0–0.1)
EOS%: 0.8 % (ref 0.0–7.0)
Eosinophils Absolute: 0 10*3/uL (ref 0.0–0.5)
HEMATOCRIT: 37.5 % — AB (ref 38.4–49.9)
HEMOGLOBIN: 12.9 g/dL — AB (ref 13.0–17.1)
LYMPH%: 9.7 % — AB (ref 14.0–49.0)
MCH: 32.9 pg (ref 27.2–33.4)
MCHC: 34.4 g/dL (ref 32.0–36.0)
MCV: 95.7 fL (ref 79.3–98.0)
MONO#: 0.5 10*3/uL (ref 0.1–0.9)
MONO%: 9.9 % (ref 0.0–14.0)
NEUT#: 3.9 10*3/uL (ref 1.5–6.5)
NEUT%: 79.2 % — AB (ref 39.0–75.0)
Platelets: 123 10*3/uL — ABNORMAL LOW (ref 140–400)
RBC: 3.92 10*6/uL — AB (ref 4.20–5.82)
RDW: 13.5 % (ref 11.0–14.6)
WBC: 4.9 10*3/uL (ref 4.0–10.3)
lymph#: 0.5 10*3/uL — ABNORMAL LOW (ref 0.9–3.3)

## 2014-11-14 LAB — COMPREHENSIVE METABOLIC PANEL (CC13)
ALK PHOS: 78 U/L (ref 40–150)
ALT: 17 U/L (ref 0–55)
ANION GAP: 8 meq/L (ref 3–11)
AST: 23 U/L (ref 5–34)
Albumin: 3.9 g/dL (ref 3.5–5.0)
BUN: 21.8 mg/dL (ref 7.0–26.0)
CALCIUM: 9.4 mg/dL (ref 8.4–10.4)
CHLORIDE: 103 meq/L (ref 98–109)
CO2: 29 mEq/L (ref 22–29)
Creatinine: 0.9 mg/dL (ref 0.7–1.3)
EGFR: 82 mL/min/{1.73_m2} — AB (ref >90)
Glucose: 100 mg/dl (ref 70–140)
POTASSIUM: 3.9 meq/L (ref 3.5–5.1)
Sodium: 140 mEq/L (ref 136–145)
TOTAL PROTEIN: 6.6 g/dL (ref 6.4–8.3)
Total Bilirubin: 0.72 mg/dL (ref 0.20–1.20)

## 2014-11-14 LAB — MAGNESIUM (CC13): MAGNESIUM: 2 mg/dL (ref 1.5–2.5)

## 2014-11-14 MED ORDER — HEPARIN SOD (PORK) LOCK FLUSH 100 UNIT/ML IV SOLN
500.0000 [IU] | Freq: Once | INTRAVENOUS | Status: AC
  Administered 2014-11-14: 500 [IU] via INTRAVENOUS
  Filled 2014-11-14: qty 5

## 2014-11-14 MED ORDER — SODIUM CHLORIDE 0.9 % IJ SOLN
10.0000 mL | INTRAMUSCULAR | Status: DC | PRN
  Administered 2014-11-14: 10 mL via INTRAVENOUS
  Filled 2014-11-14: qty 10

## 2014-11-14 NOTE — Patient Instructions (Signed)

## 2014-11-20 ENCOUNTER — Other Ambulatory Visit: Payer: Self-pay

## 2014-11-21 ENCOUNTER — Encounter: Payer: Self-pay | Admitting: *Deleted

## 2014-11-21 ENCOUNTER — Ambulatory Visit (HOSPITAL_BASED_OUTPATIENT_CLINIC_OR_DEPARTMENT_OTHER): Payer: Medicare Other | Admitting: Internal Medicine

## 2014-11-21 ENCOUNTER — Encounter: Payer: Self-pay | Admitting: Internal Medicine

## 2014-11-21 ENCOUNTER — Telehealth: Payer: Self-pay | Admitting: Internal Medicine

## 2014-11-21 VITALS — BP 127/67 | HR 103 | Temp 97.8°F | Resp 18 | Ht 65.0 in | Wt 121.0 lb

## 2014-11-21 DIAGNOSIS — C3491 Malignant neoplasm of unspecified part of right bronchus or lung: Secondary | ICD-10-CM

## 2014-11-21 DIAGNOSIS — C7A1 Malignant poorly differentiated neuroendocrine tumors: Secondary | ICD-10-CM

## 2014-11-21 NOTE — Progress Notes (Signed)
Oncology Nurse Navigator Documentation  Oncology Nurse Navigator Flowsheets 11/21/2014  Navigator Encounter Type Other/Spoke with wife and patient today.  Patient is currently on observation post chemo.  He stated he is doing well without complaints.   Patient Visit Type Medonc  Treatment Phase Other/Observation  Barriers/Navigation Needs No barriers at this time  Support Groups/Services Friends and Family  Time Spent with Patient 15

## 2014-11-21 NOTE — Telephone Encounter (Signed)
s.w. pt and advised on Sept appts

## 2014-11-21 NOTE — Progress Notes (Signed)
[name]   Fax:(336) [phone]  OFFICE PROGRESS NOTE  Patrick Pel, MD [address] [place] [address]  DIAGNOSIS: Limited stage small cell lung cancer, stage IIA (T1a, N1, M0) presented with right lower lobe nodule in addition to right hilar lymphadenopathy diagnosed in August of 2015.  PRIOR THERAPY: Systemic chemotherapy with cisplatin 60 mg/M2 on day 1 and etoposide at 120 mg/M2 on days 1, 2 and 3 with Neulasta support on day 4. Status post 6 cycles, last dose was giving 05/23/2014 with partial response. He is not interested in prophylactic cranial irradiation after discussion with Dr. [name].  CURRENT THERAPY: Observation.  INTERVAL HISTORY: Patrick Carr 81 y.o. male returns to the clinic today for followup visit accompanied by his wife. The patient is feeling fine today with no specific complaints. He denied having any fever or chills. He has no nausea or vomiting. He denied having any significant chest pain, shortness of breath, cough or hemoptysis. He has no significant weight loss or night sweats. He had repeat CT scan of the chest performed recently and he is here for evaluation and discussion of his scan results.  MEDICAL HISTORY: Past Medical History  Diagnosis Date  . Hypertension   . Hyperlipidemia   . BPH (benign prostatic hyperplasia)   . Lesion of right lung     RLL  . RBBB     no palpations  . Renal cysts, acquired, bilateral   . History of kidney stones   . Hernia, inguinal left   . History of blood transfusion     artery in sinus bleed  . Phlebitis 03/20/14    bilateral forearms  . Radiation 02/08/14-03/21/14    Stage III lung cancer 30 fractions  . Irregular heart rate   . Cancer     Lung    ALLERGIES:  is allergic to contrast media; other; and pneumococcal vaccines.  MEDICATIONS:  Current Outpatient Prescriptions  Medication Sig Dispense Refill  . alfuzosin  (UROXATRAL) 10 MG 24 hr tablet Take 10 mg by mouth at bedtime.     . ALPRAZolam (XANAX) 0.25 MG tablet Take 0.25 mg by mouth at bedtime as needed for anxiety.    [name] amLODipine (NORVASC) 5 MG tablet Take 5 mg by mouth at bedtime.     [name] aspirin 81 MG tablet Take 81 mg by mouth at bedtime.     [name] atorvastatin (LIPITOR) 40 MG tablet Take 40 mg by mouth at bedtime.     . famotidine (PEPCID) 20 MG tablet Take 1 tablet (20 mg total) by mouth 2 (two) times daily. 30 tablet 0  . lidocaine-prilocaine (EMLA) cream Apply 1 application topically as needed. Apply over port site 1 hour prior to chemo. Do not rub in medicine. 30 g 0  . tamsulosin (FLOMAX) 0.4 MG CAPS capsule Take 0.4 mg by mouth at bedtime.     . diphenhydrAMINE (BENADRYL) 25 MG tablet Take 1 tablet (25 mg total) by mouth every 6 (six) hours as needed for itching (Rash). (Patient not taking: Reported on 11/21/2014) 30 tablet 0   No current facility-administered medications for this visit.   Facility-Administered Medications Ordered in Other Visits  Medication Dose Route Frequency Provider Last Rate Last Dose  . sodium chloride 0.9 % injection 10 mL  10 mL Intravenous PRN [name], MD   10 mL at 05/23/14 1635    SURGICAL HISTORY:  Past Surgical History  Procedure Laterality Date  .  Sinus artery surgery      not surgery  . No past surgeries    . Video bronchoscopy with endobronchial navigation N/A 01/18/2014    Procedure: VIDEO BRONCHOSCOPY WITH ENDOBRONCHIAL NAVIGATION;  Surgeon: [name], MD;  Location: [facility] OR;  Service: Thoracic;  Laterality: N/A;  . Video bronchoscopy with endobronchial ultrasound N/A 01/18/2014    Procedure: VIDEO BRONCHOSCOPY WITH ENDOBRONCHIAL ULTRASOUND;  Surgeon: [name], MD;  Location: [place];  Service: Thoracic;  Laterality: N/A;  . Video bronchoscopy N/A 07/04/2014    Procedure: VIDEO BRONCHOSCOPY WITH  ENDOBRONCIAL BIOPSY;  Surgeon: [name], MD;  Location: [place];  Service: Thoracic;   Laterality: N/A;    REVIEW OF SYSTEMS:  A comprehensive review of systems was negative.   PHYSICAL EXAMINATION: General appearance: alert, cooperative and no distress Head: Normocephalic, without obvious abnormality, atraumatic Neck: no adenopathy, no JVD, supple, symmetrical, trachea midline and thyroid not enlarged, symmetric, no tenderness/mass/nodules Lymph nodes: Cervical, supraclavicular, and axillary nodes normal. Resp: clear to auscultation bilaterally Back: symmetric, no curvature. ROM normal. No CVA tenderness. Cardio: regular rate and rhythm, S1, S2 normal, no murmur, click, rub or gallop GI: soft, non-tender; bowel sounds normal; no masses,  no organomegaly Extremities: extremities normal, atraumatic, no cyanosis or edema  ECOG PERFORMANCE STATUS: 1 - Symptomatic but completely ambulatory  Blood pressure 127/67, pulse 103, temperature 97.8 F (36.6 C), temperature source Oral, resp. rate 18, height '5\' 5"'$  (1.651 m), weight 121 lb (54.885 kg), SpO2 98 %.  LABORATORY DATA: Lab Results  Component Value Date   WBC 4.9 11/14/2014   HGB 12.9* 11/14/2014   HCT 37.5* 11/14/2014   MCV 95.7 11/14/2014   PLT 123* 11/14/2014      Chemistry      Component Value Date/Time   NA 140 11/14/2014 1137   NA 140 07/03/2014 1036   K 3.9 11/14/2014 1137   K 4.1 07/03/2014 1036   CL 106 07/03/2014 1036   CO2 29 11/14/2014 1137   CO2 26 07/03/2014 1036   BUN 21.8 11/14/2014 1137   BUN 27* 07/03/2014 1036   CREATININE 0.9 11/14/2014 1137   CREATININE 0.93 07/03/2014 1036   CREATININE 0.80 01/12/2014 1519      Component Value Date/Time   CALCIUM 9.4 11/14/2014 1137   CALCIUM 9.6 07/03/2014 1036   ALKPHOS 78 11/14/2014 1137   ALKPHOS 64 07/03/2014 1036   AST 23 11/14/2014 1137   AST 24 07/03/2014 1036   ALT 17 11/14/2014 1137   ALT 16 07/03/2014 1036   BILITOT 0.72 11/14/2014 1137   BILITOT 0.8 07/03/2014 1036       RADIOGRAPHIC STUDIES: Ct Chest Wo Contrast  11/14/2014    CLINICAL DATA:  81 year old male with history of right-sided lung cancer diagnosed in 2015, status post chemotherapy and radiation therapy now complete. Restaging examination.  EXAM: CT CHEST WITHOUT CONTRAST  TECHNIQUE: Multidetector CT imaging of the chest was performed following the standard protocol without IV contrast.  COMPARISON:  CT of the thorax 08/11/2014.  FINDINGS: Mediastinum/Lymph Nodes: Heart size is normal. There is no significant pericardial fluid, thickening or pericardial calcification. There is atherosclerosis of the thoracic aorta, the great vessels of the mediastinum and the coronary arteries, including calcified atherosclerotic plaque in the left main, left circumflex and right coronary arteries. No pathologically enlarged mediastinal or hilar lymph nodes. Please note that accurate exclusion of hilar adenopathy is limited on noncontrast CT scans. Esophagus is unremarkable in appearance. No axillary lymphadenopathy. Right-sided  internal jugular single-lumen porta cath with tip terminating in the superior aspect of the right atrium.  Lungs/Pleura: Again noted is a 9 x 5 mm (unchanged within measurement error) nodule in the superior segment of the right lower lobe (image 30 of series 5). No other suspicious appearing pulmonary nodules or masses are noted. Extensive right apical partially calcified mass-like pleuroparenchymal thickening, stable over numerous prior examinations, presumably chronic post infectious or inflammatory scarring. Mild diffuse bronchial wall thickening. Moderate centrilobular and mild paraseptal emphysema. No acute consolidative airspace disease. No pleural effusions.  Upper Abdomen: Several renal lesions again noted bilaterally, some of which are low-attenuation and likely cysts, while others are high attenuation and likely proteinaceous or hemorrhagic cysts (all incompletely characterized on today's noncontrast CT examination), similar to prior examinations.  Atherosclerosis. Sub cm low-attenuation lesion in segment 2 of the liver is similar to the prior examination, likely a tiny cyst.  Musculoskeletal/Soft Tissues: Multilevel degenerative disc disease with associated discogenic changes in the visualized cervical and thoracic spine. There are no aggressive appearing lytic or blastic lesions noted in the visualized portions of the skeleton.  IMPRESSION: 1. Stable appearance of treated nodule in the superior segment of the right lower lobe. No new nodules or recurrent lymphadenopathy noted. 2. Mild diffuse bronchial wall thickening with moderate centrilobular and mild paraseptal emphysema; imaging findings suggestive of underlying COPD. 3. Atherosclerosis, including left main and 2 vessel coronary artery disease. Assessment for potential risk factor modification, dietary therapy or pharmacologic therapy may be warranted, if clinically indicated. 4. Additional incidental findings, similar prior studies, as above.   Electronically Signed   By: Vinnie Langton M.D.   On: 11/14/2014 14:22    ASSESSMENT AND PLAN: This is a very pleasant 81 years old white male recently diagnosed with limited stage small cell lung cancer and currently undergoing systemic chemotherapy with cisplatin and etoposide status post 6 cycle. He tolerated his treatment fairly well with no significant adverse effects. He has been observation for the last 6 months and doing very well. The recent CT scan of the chest showed no evidence for disease progression. I discussed the scan results with the patient and his wife. I recommended for him to continue on observation with repeat CT scan of the chest in 3 months. He will continue to have Port-A-Cath flush every 6 weeks. He was advised to call immediately if he has any concerning symptoms in the interval. The patient voices understanding of current disease status and treatment options and is in agreement with the current care plan.  All questions  were answered. The patient knows to call the clinic with any problems, questions or concerns. We can certainly see the patient much sooner if necessary.  Disclaimer: This note was dictated with voice recognition software. Similar sounding words can inadvertently be transcribed and may not be corrected upon review.

## 2014-12-19 ENCOUNTER — Ambulatory Visit (HOSPITAL_BASED_OUTPATIENT_CLINIC_OR_DEPARTMENT_OTHER): Payer: Medicare Other

## 2014-12-19 VITALS — BP 91/57 | HR 107 | Temp 97.4°F | Resp 16

## 2014-12-19 DIAGNOSIS — C7A1 Malignant poorly differentiated neuroendocrine tumors: Secondary | ICD-10-CM

## 2014-12-19 DIAGNOSIS — Z452 Encounter for adjustment and management of vascular access device: Secondary | ICD-10-CM

## 2014-12-19 DIAGNOSIS — Z95828 Presence of other vascular implants and grafts: Secondary | ICD-10-CM

## 2014-12-19 MED ORDER — SODIUM CHLORIDE 0.9 % IJ SOLN
10.0000 mL | INTRAMUSCULAR | Status: DC | PRN
  Administered 2014-12-19: 10 mL via INTRAVENOUS
  Filled 2014-12-19: qty 10

## 2014-12-19 MED ORDER — HEPARIN SOD (PORK) LOCK FLUSH 100 UNIT/ML IV SOLN
500.0000 [IU] | Freq: Once | INTRAVENOUS | Status: AC
  Administered 2014-12-19: 500 [IU] via INTRAVENOUS
  Filled 2014-12-19: qty 5

## 2014-12-19 NOTE — Patient Instructions (Signed)

## 2015-01-17 DIAGNOSIS — E780 Pure hypercholesterolemia: Secondary | ICD-10-CM | POA: Diagnosis not present

## 2015-01-17 DIAGNOSIS — Z125 Encounter for screening for malignant neoplasm of prostate: Secondary | ICD-10-CM | POA: Diagnosis not present

## 2015-01-17 DIAGNOSIS — I10 Essential (primary) hypertension: Secondary | ICD-10-CM | POA: Diagnosis not present

## 2015-01-17 DIAGNOSIS — Z0000 Encounter for general adult medical examination without abnormal findings: Secondary | ICD-10-CM | POA: Diagnosis not present

## 2015-01-23 DIAGNOSIS — I10 Essential (primary) hypertension: Secondary | ICD-10-CM | POA: Diagnosis not present

## 2015-01-23 DIAGNOSIS — Z0000 Encounter for general adult medical examination without abnormal findings: Secondary | ICD-10-CM | POA: Diagnosis not present

## 2015-01-24 DIAGNOSIS — N401 Enlarged prostate with lower urinary tract symptoms: Secondary | ICD-10-CM | POA: Diagnosis not present

## 2015-01-24 DIAGNOSIS — N138 Other obstructive and reflux uropathy: Secondary | ICD-10-CM | POA: Diagnosis not present

## 2015-01-24 DIAGNOSIS — R972 Elevated prostate specific antigen [PSA]: Secondary | ICD-10-CM | POA: Diagnosis not present

## 2015-01-24 DIAGNOSIS — R3912 Poor urinary stream: Secondary | ICD-10-CM | POA: Diagnosis not present

## 2015-01-30 ENCOUNTER — Other Ambulatory Visit (HOSPITAL_BASED_OUTPATIENT_CLINIC_OR_DEPARTMENT_OTHER): Payer: Medicare Other

## 2015-01-30 ENCOUNTER — Ambulatory Visit (HOSPITAL_BASED_OUTPATIENT_CLINIC_OR_DEPARTMENT_OTHER): Payer: Medicare Other

## 2015-01-30 DIAGNOSIS — C7A1 Malignant poorly differentiated neuroendocrine tumors: Secondary | ICD-10-CM

## 2015-01-30 DIAGNOSIS — Z452 Encounter for adjustment and management of vascular access device: Secondary | ICD-10-CM | POA: Diagnosis not present

## 2015-01-30 DIAGNOSIS — Z95828 Presence of other vascular implants and grafts: Secondary | ICD-10-CM

## 2015-01-30 DIAGNOSIS — C3491 Malignant neoplasm of unspecified part of right bronchus or lung: Secondary | ICD-10-CM

## 2015-01-30 LAB — COMPREHENSIVE METABOLIC PANEL (CC13)
ALT: 17 U/L (ref 0–55)
AST: 25 U/L (ref 5–34)
Albumin: 3.7 g/dL (ref 3.5–5.0)
Alkaline Phosphatase: 79 U/L (ref 40–150)
Anion Gap: 9 mEq/L (ref 3–11)
BUN: 29.1 mg/dL — AB (ref 7.0–26.0)
CALCIUM: 9.4 mg/dL (ref 8.4–10.4)
CHLORIDE: 103 meq/L (ref 98–109)
CO2: 28 meq/L (ref 22–29)
CREATININE: 1.3 mg/dL (ref 0.7–1.3)
EGFR: 53 mL/min/{1.73_m2} — ABNORMAL LOW (ref >90)
GLUCOSE: 115 mg/dL (ref 70–140)
POTASSIUM: 4.4 meq/L (ref 3.5–5.1)
SODIUM: 141 meq/L (ref 136–145)
Total Bilirubin: 0.65 mg/dL (ref 0.20–1.20)
Total Protein: 6.8 g/dL (ref 6.4–8.3)

## 2015-01-30 LAB — CBC WITH DIFFERENTIAL/PLATELET
BASO%: 0.4 % (ref 0.0–2.0)
BASOS ABS: 0 10*3/uL (ref 0.0–0.1)
EOS ABS: 0 10*3/uL (ref 0.0–0.5)
EOS%: 0.6 % (ref 0.0–7.0)
HCT: 39 % (ref 38.4–49.9)
HGB: 13.2 g/dL (ref 13.0–17.1)
LYMPH%: 10.2 % — AB (ref 14.0–49.0)
MCH: 32.7 pg (ref 27.2–33.4)
MCHC: 33.8 g/dL (ref 32.0–36.0)
MCV: 96.5 fL (ref 79.3–98.0)
MONO#: 0.5 10*3/uL (ref 0.1–0.9)
MONO%: 8.9 % (ref 0.0–14.0)
NEUT#: 4.2 10*3/uL (ref 1.5–6.5)
NEUT%: 79.9 % — ABNORMAL HIGH (ref 39.0–75.0)
Platelets: 143 10*3/uL (ref 140–400)
RBC: 4.04 10*6/uL — AB (ref 4.20–5.82)
RDW: 13.6 % (ref 11.0–14.6)
WBC: 5.3 10*3/uL (ref 4.0–10.3)
lymph#: 0.5 10*3/uL — ABNORMAL LOW (ref 0.9–3.3)

## 2015-01-30 MED ORDER — SODIUM CHLORIDE 0.9 % IJ SOLN
10.0000 mL | INTRAMUSCULAR | Status: DC | PRN
  Administered 2015-01-30: 10 mL via INTRAVENOUS
  Filled 2015-01-30: qty 10

## 2015-01-30 MED ORDER — HEPARIN SOD (PORK) LOCK FLUSH 100 UNIT/ML IV SOLN
500.0000 [IU] | Freq: Once | INTRAVENOUS | Status: AC
  Administered 2015-01-30: 500 [IU] via INTRAVENOUS
  Filled 2015-01-30: qty 5

## 2015-01-30 NOTE — Patient Instructions (Signed)

## 2015-02-06 ENCOUNTER — Telehealth: Payer: Self-pay | Admitting: Internal Medicine

## 2015-02-06 ENCOUNTER — Encounter: Payer: Self-pay | Admitting: Internal Medicine

## 2015-02-06 ENCOUNTER — Ambulatory Visit (HOSPITAL_BASED_OUTPATIENT_CLINIC_OR_DEPARTMENT_OTHER): Payer: Medicare Other | Admitting: Internal Medicine

## 2015-02-06 VITALS — BP 109/63 | HR 105 | Temp 97.0°F | Resp 18 | Ht 65.0 in | Wt 122.3 lb

## 2015-02-06 DIAGNOSIS — C7A1 Malignant poorly differentiated neuroendocrine tumors: Secondary | ICD-10-CM

## 2015-02-06 DIAGNOSIS — C3491 Malignant neoplasm of unspecified part of right bronchus or lung: Secondary | ICD-10-CM

## 2015-02-06 NOTE — Telephone Encounter (Signed)
lvm for pt regarding to all appts. °

## 2015-02-06 NOTE — Progress Notes (Signed)
[name] Telephone:([phone]) [phone]   Fax:(336) [phone]  OFFICE PROGRESS NOTE  [name], MD [address] [place] [address]  DIAGNOSIS: Limited stage small cell lung cancer, stage IIA (T1a, N1, M0) presented with right lower lobe nodule in addition to right hilar lymphadenopathy diagnosed in August of 2015.  PRIOR THERAPY: Systemic chemotherapy with cisplatin 60 mg/M2 on day 1 and etoposide at 120 mg/M2 on days 1, 2 and 3 with Neulasta support on day 4. Status post 6 cycles, last dose was giving 05/23/2014 with partial response. He is not interested in prophylactic cranial irradiation after discussion with Dr. [name].  CURRENT THERAPY: Observation.  INTERVAL HISTORY: Patrick Carr 81 y.o. male returns to the clinic today for followup visit accompanied by his wife. The patient is feeling fine today with no specific complaints. He denied having any fever or chills. He has no nausea or vomiting. He denied having any significant chest pain, shortness of breath, cough or hemoptysis. He has no significant weight loss or night sweats. He was supposed to have repeat CT scan of the chest performed before his visit today but unfortunately the scan is scheduled for 02/20/2015.  MEDICAL HISTORY: Past Medical History  Diagnosis Date  . Hypertension   . Hyperlipidemia   . BPH (benign prostatic hyperplasia)   . Lesion of right lung     RLL  . RBBB     no palpations  . Renal cysts, acquired, bilateral   . History of kidney stones   . Hernia, inguinal left   . History of blood transfusion     artery in sinus bleed  . Phlebitis 03/20/14    bilateral forearms  . Radiation 02/08/14-03/21/14    Stage III lung cancer 30 fractions  . Irregular heart rate   . Cancer     Lung    ALLERGIES:  is allergic to contrast media; other; and pneumococcal vaccines.  MEDICATIONS:  Current Outpatient Prescriptions  Medication Sig Dispense Refill  .  alfuzosin (UROXATRAL) 10 MG 24 hr tablet Take 10 mg by mouth at bedtime.     . ALPRAZolam (XANAX) 0.25 MG tablet Take 0.25 mg by mouth at bedtime as needed for anxiety.    [name] amLODipine (NORVASC) 5 MG tablet Take 5 mg by mouth at bedtime.     [name] aspirin 81 MG tablet Take 81 mg by mouth at bedtime.     [name] atorvastatin (LIPITOR) 40 MG tablet Take 40 mg by mouth at bedtime.     . diphenhydrAMINE (BENADRYL) 25 MG tablet Take 1 tablet (25 mg total) by mouth every 6 (six) hours as needed for itching (Rash). (Patient not taking: Reported on 11/21/2014) 30 tablet 0  . famotidine (PEPCID) 20 MG tablet Take 1 tablet (20 mg total) by mouth 2 (two) times daily. 30 tablet 0  . lidocaine-prilocaine (EMLA) cream Apply 1 application topically as needed. Apply over port site 1 hour prior to chemo. Do not rub in medicine. 30 g 0  . tamsulosin (FLOMAX) 0.4 MG CAPS capsule Take 0.4 mg by mouth at bedtime.      No current facility-administered medications for this visit.   Facility-Administered Medications Ordered in Other Visits  Medication Dose Route Frequency Provider Last Rate Last Dose  . sodium chloride 0.9 % injection 10 mL  10 mL Intravenous PRN [name], MD   10 mL at 05/23/14 1635    SURGICAL HISTORY:  Past Surgical History  Procedure  Laterality Date  . Sinus artery surgery      not surgery  . No past surgeries    . Video bronchoscopy with endobronchial navigation N/A 01/18/2014    Procedure: VIDEO BRONCHOSCOPY WITH ENDOBRONCHIAL NAVIGATION;  Surgeon: [name], MD;  Location: [facility] OR;  Service: Thoracic;  Laterality: N/A;  . Video bronchoscopy with endobronchial ultrasound N/A 01/18/2014    Procedure: VIDEO BRONCHOSCOPY WITH ENDOBRONCHIAL ULTRASOUND;  Surgeon: [name], MD;  Location: [place];  Service: Thoracic;  Laterality: N/A;  . Video bronchoscopy N/A 07/04/2014    Procedure: VIDEO BRONCHOSCOPY WITH  ENDOBRONCIAL BIOPSY;  Surgeon: [name], MD;  Location: [place];  Service:  Thoracic;  Laterality: N/A;    REVIEW OF SYSTEMS:  A comprehensive review of systems was negative.   PHYSICAL EXAMINATION: General appearance: alert, cooperative and no distress Head: Normocephalic, without obvious abnormality, atraumatic Neck: no adenopathy, no JVD, supple, symmetrical, trachea midline and thyroid not enlarged, symmetric, no tenderness/mass/nodules Lymph nodes: Cervical, supraclavicular, and axillary nodes normal. Resp: clear to auscultation bilaterally Back: symmetric, no curvature. ROM normal. No CVA tenderness. Cardio: regular rate and rhythm, S1, S2 normal, no murmur, click, rub or gallop GI: soft, non-tender; bowel sounds normal; no masses,  no organomegaly Extremities: extremities normal, atraumatic, no cyanosis or edema  ECOG PERFORMANCE STATUS: 1 - Symptomatic but completely ambulatory  Blood pressure 109/63, pulse 105, temperature 97 F (36.1 C), temperature source Oral, resp. rate 18, height '5\' 5"'$  (1.651 m), weight 122 lb 4.8 oz (55.475 kg), SpO2 97 %.  LABORATORY DATA: Lab Results  Component Value Date   WBC 5.3 01/30/2015   HGB 13.2 01/30/2015   HCT 39.0 01/30/2015   MCV 96.5 01/30/2015   PLT 143 01/30/2015      Chemistry      Component Value Date/Time   NA 141 01/30/2015 1115   NA 140 07/03/2014 1036   K 4.4 01/30/2015 1115   K 4.1 07/03/2014 1036   CL 106 07/03/2014 1036   CO2 28 01/30/2015 1115   CO2 26 07/03/2014 1036   BUN 29.1* 01/30/2015 1115   BUN 27* 07/03/2014 1036   CREATININE 1.3 01/30/2015 1115   CREATININE 0.93 07/03/2014 1036   CREATININE 0.80 01/12/2014 1519      Component Value Date/Time   CALCIUM 9.4 01/30/2015 1115   CALCIUM 9.6 07/03/2014 1036   ALKPHOS 79 01/30/2015 1115   ALKPHOS 64 07/03/2014 1036   AST 25 01/30/2015 1115   AST 24 07/03/2014 1036   ALT 17 01/30/2015 1115   ALT 16 07/03/2014 1036   BILITOT 0.65 01/30/2015 1115   BILITOT 0.8 07/03/2014 1036       RADIOGRAPHIC STUDIES: No results  found.  ASSESSMENT AND PLAN: This is a very pleasant 81 years old white male recently diagnosed with limited stage small cell lung cancer and currently undergoing systemic chemotherapy with cisplatin and etoposide status post 6 cycle. He tolerated his treatment fairly well with no significant adverse effects. He has been observation for the last 9 months and doing very well. I recommended for the patient to have his CT scan performed on 02/20/2015 as a scheduled. If there is no evidence for disease progression, he will come back for follow-up visit in 3 months with repeat CT scan of the chest. CT scan showed any evidence for disease progression, I will ask the patient to come back sooner for evaluation and recommendation regarding treatment of his condition. He will continue to have Port-A-Cath flush  every 6 weeks. He was advised to call immediately if he has any concerning symptoms in the interval. The patient voices understanding of current disease status and treatment options and is in agreement with the current care plan.  All questions were answered. The patient knows to call the clinic with any problems, questions or concerns. We can certainly see the patient much sooner if necessary.  Disclaimer: This note was dictated with voice recognition software. Similar sounding words can inadvertently be transcribed and may not be corrected upon review.

## 2015-02-14 ENCOUNTER — Telehealth: Payer: Self-pay | Admitting: Internal Medicine

## 2015-02-14 NOTE — Telephone Encounter (Signed)
pt called to sched f/u done....pt ok and aware

## 2015-02-20 ENCOUNTER — Encounter (HOSPITAL_COMMUNITY): Payer: Self-pay

## 2015-02-20 ENCOUNTER — Other Ambulatory Visit: Payer: Self-pay | Admitting: Internal Medicine

## 2015-02-20 ENCOUNTER — Ambulatory Visit (HOSPITAL_COMMUNITY)
Admission: RE | Admit: 2015-02-20 | Discharge: 2015-02-20 | Disposition: A | Discharge status: Home / Self Care | Payer: Medicare Other | Source: Ambulatory Visit | Attending: Internal Medicine | Admitting: Internal Medicine

## 2015-02-20 DIAGNOSIS — I700 Atherosclerosis of aorta: Secondary | ICD-10-CM | POA: Insufficient documentation

## 2015-02-20 DIAGNOSIS — J439 Emphysema, unspecified: Secondary | ICD-10-CM | POA: Diagnosis not present

## 2015-02-20 DIAGNOSIS — C3491 Malignant neoplasm of unspecified part of right bronchus or lung: Secondary | ICD-10-CM | POA: Insufficient documentation

## 2015-02-20 DIAGNOSIS — C3490 Malignant neoplasm of unspecified part of unspecified bronchus or lung: Secondary | ICD-10-CM | POA: Diagnosis not present

## 2015-02-21 ENCOUNTER — Ambulatory Visit (HOSPITAL_BASED_OUTPATIENT_CLINIC_OR_DEPARTMENT_OTHER): Payer: Medicare Other | Admitting: Physician Assistant

## 2015-02-21 ENCOUNTER — Encounter: Payer: Self-pay | Admitting: Physician Assistant

## 2015-02-21 ENCOUNTER — Telehealth: Payer: Self-pay | Admitting: Internal Medicine

## 2015-02-21 VITALS — BP 135/76 | HR 97 | Temp 97.7°F | Resp 18 | Ht 65.0 in | Wt 122.0 lb

## 2015-02-21 DIAGNOSIS — I10 Essential (primary) hypertension: Secondary | ICD-10-CM | POA: Diagnosis not present

## 2015-02-21 DIAGNOSIS — C7A1 Malignant poorly differentiated neuroendocrine tumors: Secondary | ICD-10-CM

## 2015-02-21 DIAGNOSIS — C3491 Malignant neoplasm of unspecified part of right bronchus or lung: Secondary | ICD-10-CM

## 2015-02-21 NOTE — Progress Notes (Signed)
[place] Telephone:(336) [phone]   Fax:(336) [phone]  OFFICE PROGRESS NOTE  [name], MD [address] [place] [address]  DIAGNOSIS: Limited stage small cell lung cancer, stage IIA (T1a, N1, M0) presented with right lower lobe nodule in addition to right hilar lymphadenopathy diagnosed in August of 2015.  PRIOR THERAPY: Systemic chemotherapy with cisplatin 60 mg/M2 on day 1 and etoposide at 120 mg/M2 on days 1, 2 and 3 with Neulasta support on day 4. Status post 6 cycles, last dose was giving 05/23/2014 with partial response. He is not interested in prophylactic cranial irradiation after discussion with Dr. [name].  CURRENT THERAPY: Observation.  INTERVAL HISTORY: Patrick Carr 81 y.o. male returns to the clinic today for followup visit accompanied by his wife. The patient is feeling fine today with no specific complaints. He denied having any fever or chills. He has no nausea or vomiting. He denied having any significant chest pain, shortness of breath, cough or hemoptysis. He has no significant weight loss or night sweats. He recently had a repeat CT scan of the chest performed and presents to discuss the results.  MEDICAL HISTORY: Past Medical History  Diagnosis Date  . Hypertension   . Hyperlipidemia   . BPH (benign prostatic hyperplasia)   . Lesion of right lung     RLL  . RBBB     no palpations  . Renal cysts, acquired, bilateral   . History of kidney stones   . Hernia, inguinal left   . History of blood transfusion     artery in sinus bleed  . Phlebitis 03/20/14    bilateral forearms  . Radiation 02/08/14-03/21/14    Stage III lung cancer 30 fractions  . Irregular heart rate   . Cancer     Lung    ALLERGIES:  is allergic to contrast media; other; and pneumococcal vaccines.  MEDICATIONS:  Current Outpatient Prescriptions  Medication Sig Dispense Refill  . alfuzosin (UROXATRAL) 10 MG 24 hr tablet Take  10 mg by mouth at bedtime.     . ALPRAZolam (XANAX) 0.25 MG tablet Take 0.25 mg by mouth at bedtime as needed for anxiety.    [name] amLODipine (NORVASC) 5 MG tablet Take 5 mg by mouth at bedtime.     [name] aspirin 81 MG tablet Take 81 mg by mouth at bedtime.     [name] atorvastatin (LIPITOR) 40 MG tablet Take 40 mg by mouth at bedtime.     . diphenhydrAMINE (BENADRYL) 25 MG tablet Take 1 tablet (25 mg total) by mouth every 6 (six) hours as needed for itching (Rash). (Patient not taking: Reported on 11/21/2014) 30 tablet 0  . famotidine (PEPCID) 20 MG tablet Take 1 tablet (20 mg total) by mouth 2 (two) times daily. 30 tablet 0  . lidocaine-prilocaine (EMLA) cream Apply 1 application topically as needed. Apply over port site 1 hour prior to chemo. Do not rub in medicine. 30 g 0  . tamsulosin (FLOMAX) 0.4 MG CAPS capsule Take 0.4 mg by mouth at bedtime.      No current facility-administered medications for this visit.   Facility-Administered Medications Ordered in Other Visits  Medication Dose Route Frequency Provider Last Rate Last Dose  . sodium chloride 0.9 % injection 10 mL  10 mL Intravenous PRN [name], MD   10 mL at 05/23/14 1635    SURGICAL HISTORY:  Past Surgical History  Procedure Laterality Date  . Sinus artery surgery  not surgery  . No past surgeries    . Video bronchoscopy with endobronchial navigation N/A 01/18/2014    Procedure: VIDEO BRONCHOSCOPY WITH ENDOBRONCHIAL NAVIGATION;  Surgeon: [name], MD;  Location: [facility] OR;  Service: Thoracic;  Laterality: N/A;  . Video bronchoscopy with endobronchial ultrasound N/A 01/18/2014    Procedure: VIDEO BRONCHOSCOPY WITH ENDOBRONCHIAL ULTRASOUND;  Surgeon: [name], MD;  Location: [place];  Service: Thoracic;  Laterality: N/A;  . Video bronchoscopy N/A 07/04/2014    Procedure: VIDEO BRONCHOSCOPY WITH  ENDOBRONCIAL BIOPSY;  Surgeon: [name], MD;  Location: [place];  Service: Thoracic;  Laterality: N/A;    REVIEW OF  SYSTEMS:  A comprehensive review of systems was negative.   PHYSICAL EXAMINATION: General appearance: alert, cooperative and no distress Head: Normocephalic, without obvious abnormality, atraumatic Neck: no adenopathy, no JVD, supple, symmetrical, trachea midline and thyroid not enlarged, symmetric, no tenderness/mass/nodules Lymph nodes: Cervical, supraclavicular, and axillary nodes normal. Resp: clear to auscultation bilaterally Back: symmetric, no curvature. ROM normal. No CVA tenderness. Cardio: regular rate and rhythm, S1, S2 normal, no murmur, click, rub or gallop GI: soft, non-tender; bowel sounds normal; no masses,  no organomegaly Extremities: extremities normal, atraumatic, no cyanosis or edema  ECOG PERFORMANCE STATUS: 1 - Symptomatic but completely ambulatory  Blood pressure 135/76, pulse 97, temperature 97.7 F (36.5 C), temperature source Oral, resp. rate 18, height '5\' 5"'$  (1.651 m), weight 122 lb (55.339 kg), SpO2 97 %.  LABORATORY DATA: Lab Results  Component Value Date   WBC 5.3 01/30/2015   HGB 13.2 01/30/2015   HCT 39.0 01/30/2015   MCV 96.5 01/30/2015   PLT 143 01/30/2015      Chemistry      Component Value Date/Time   NA 141 01/30/2015 1115   NA 140 07/03/2014 1036   K 4.4 01/30/2015 1115   K 4.1 07/03/2014 1036   CL 106 07/03/2014 1036   CO2 28 01/30/2015 1115   CO2 26 07/03/2014 1036   BUN 29.1* 01/30/2015 1115   BUN 27* 07/03/2014 1036   CREATININE 1.3 01/30/2015 1115   CREATININE 0.93 07/03/2014 1036   CREATININE 0.80 01/12/2014 1519      Component Value Date/Time   CALCIUM 9.4 01/30/2015 1115   CALCIUM 9.6 07/03/2014 1036   ALKPHOS 79 01/30/2015 1115   ALKPHOS 64 07/03/2014 1036   AST 25 01/30/2015 1115   AST 24 07/03/2014 1036   ALT 17 01/30/2015 1115   ALT 16 07/03/2014 1036   BILITOT 0.65 01/30/2015 1115   BILITOT 0.8 07/03/2014 1036       RADIOGRAPHIC STUDIES: Ct Chest Wo Contrast  02/20/2015   CLINICAL DATA:  Small cell lung  cancer.  Status post chemo and XRT.  EXAM: CT CHEST WITHOUT CONTRAST  TECHNIQUE: Multidetector CT imaging of the chest was performed following the standard protocol without IV contrast.  COMPARISON:  11/14/2014  FINDINGS: Mediastinum: The heart size appears normal. No pericardial effusion identified. Aortic atherosclerosis identified. The trachea is patent and is midline. Unremarkable appearance of the esophagus. There is no mediastinal or hilar adenopathy.  Lungs/Pleura: No pleural effusion identified. Moderate changes of centrilobular emphysema identified. The treated lesion within the superior segment of right lower lobe is stable measuring 9 mm, image 27 of series 2. Calcified mass like thickening in the right apex is unchanged. No new pulmonary nodule or mass identified.  Upper Abdomen: The bones appear osteopenic. No aggressive lytic or sclerotic bone lesion identified. The adrenal glands are  normal. The visualized portions of the spleen are normal. Again noted are several hyperdense left renal lesions which are incompletely characterized without IV contrast.  Musculoskeletal: No aggressive lytic or sclerotic bone lesion identified.  IMPRESSION: 1. Stable exam. No change in treated nodule in superior segment of right lower lobe. 2. Emphysema 3. Aortic atherosclerosis.   Electronically Signed   By: Kerby Moors M.D.   On: 02/20/2015 10:13    ASSESSMENT AND PLAN: This is a very pleasant 81 years old white male recently diagnosed with limited stage small cell lung cancer and currently undergoing systemic chemotherapy with cisplatin and etoposide status post 6 cycle. He tolerated his treatment fairly well with no significant adverse effects. He has been observation for the last 9 months and doing very well. The patient was discussed with and also seen by Dr. Julien Nordmann. His recent restaging Ct scan of the chest showed stable disease. He will  Continue on observation and follow up in 3 months with another  restaging CT scan of the chest with contrast to re-evaluate his disease He will continue to have Port-A-Cath flush every 6 weeks. He was advised to call immediately if he has any concerning symptoms in the interval. The patient voices understanding of current disease status and treatment options and is in agreement with the current care plan.  All questions were answered. The patient knows to call the clinic with any problems, questions or concerns. We can certainly see the patient much sooner if necessary.  Carlton Adam, PA-C 02/21/2015  ADDENDUM: Hematology/Oncology Attending: I had a face to face encounter with the patient. I recommended his care plan. This is a very pleasant 80 years old white male with history of limited stage small cell lung cancer status post systemic chemotherapy with cisplatin and etoposide completed in December 2015 with partial response. The patient has been observation since that time. He is feeling fine today with no specific complaints. The recent CT scan of the chest showed no evidence for disease progression. I discussed the scan results with the patient and his wife. I recommended for him to continue on observation with repeat CT scan of the chest in 3 months. The patient will continue with Port-A-Cath flush every 6 weeks. He was advised to call immediately if he has any concerning symptoms in the interval.  Disclaimer: This note was dictated with voice recognition software. Similar sounding words can inadvertently be transcribed and may not be corrected upon review. Eilleen Kempf., MD 02/23/2015

## 2015-02-21 NOTE — Patient Instructions (Signed)
Your Ct scan showed stable disease Continue on observation an follow up in 3 months with another re-staging CT scan to re-evaluate your disease

## 2015-02-21 NOTE — Telephone Encounter (Signed)
Pt confirmed labs/ov/flush per 09/28 POF, gave pt AVS and Calendar... KJ

## 2015-03-13 ENCOUNTER — Ambulatory Visit (HOSPITAL_BASED_OUTPATIENT_CLINIC_OR_DEPARTMENT_OTHER): Payer: Medicare Other

## 2015-03-13 DIAGNOSIS — Z95828 Presence of other vascular implants and grafts: Secondary | ICD-10-CM

## 2015-03-13 DIAGNOSIS — Z452 Encounter for adjustment and management of vascular access device: Secondary | ICD-10-CM

## 2015-03-13 DIAGNOSIS — C7A1 Malignant poorly differentiated neuroendocrine tumors: Secondary | ICD-10-CM

## 2015-03-13 MED ORDER — HEPARIN SOD (PORK) LOCK FLUSH 100 UNIT/ML IV SOLN
500.0000 [IU] | Freq: Once | INTRAVENOUS | Status: AC
  Administered 2015-03-13: 500 [IU] via INTRAVENOUS
  Filled 2015-03-13: qty 5

## 2015-03-13 MED ORDER — SODIUM CHLORIDE 0.9 % IJ SOLN
10.0000 mL | INTRAMUSCULAR | Status: DC | PRN
  Administered 2015-03-13: 10 mL via INTRAVENOUS
  Filled 2015-03-13: qty 10

## 2015-03-13 NOTE — Patient Instructions (Signed)

## 2015-04-04 DIAGNOSIS — I10 Essential (primary) hypertension: Secondary | ICD-10-CM | POA: Diagnosis not present

## 2015-04-24 ENCOUNTER — Ambulatory Visit (HOSPITAL_BASED_OUTPATIENT_CLINIC_OR_DEPARTMENT_OTHER): Payer: Medicare Other

## 2015-04-24 VITALS — BP 110/70 | HR 99 | Temp 97.3°F | Resp 18

## 2015-04-24 DIAGNOSIS — Z452 Encounter for adjustment and management of vascular access device: Secondary | ICD-10-CM | POA: Diagnosis not present

## 2015-04-24 DIAGNOSIS — C7A1 Malignant poorly differentiated neuroendocrine tumors: Secondary | ICD-10-CM

## 2015-04-24 DIAGNOSIS — Z95828 Presence of other vascular implants and grafts: Secondary | ICD-10-CM

## 2015-04-24 MED ORDER — SODIUM CHLORIDE 0.9 % IJ SOLN
10.0000 mL | INTRAMUSCULAR | Status: DC | PRN
  Administered 2015-04-24: 10 mL via INTRAVENOUS
  Filled 2015-04-24: qty 10

## 2015-04-24 MED ORDER — HEPARIN SOD (PORK) LOCK FLUSH 100 UNIT/ML IV SOLN
500.0000 [IU] | Freq: Once | INTRAVENOUS | Status: AC
  Administered 2015-04-24: 500 [IU] via INTRAVENOUS
  Filled 2015-04-24: qty 5

## 2015-04-24 NOTE — Patient Instructions (Signed)

## 2015-04-25 ENCOUNTER — Other Ambulatory Visit: Payer: Self-pay | Admitting: *Deleted

## 2015-04-25 DIAGNOSIS — C3491 Malignant neoplasm of unspecified part of right bronchus or lung: Secondary | ICD-10-CM

## 2015-04-26 ENCOUNTER — Telehealth: Payer: Self-pay | Admitting: Internal Medicine

## 2015-04-26 NOTE — Telephone Encounter (Signed)
s.w. pt and confirmed lab/flush b4 ct....pt ok and aware

## 2015-05-18 ENCOUNTER — Other Ambulatory Visit: Payer: Self-pay | Admitting: Physician Assistant

## 2015-05-18 ENCOUNTER — Other Ambulatory Visit (HOSPITAL_COMMUNITY)
Admission: AD | Admit: 2015-05-18 | Discharge: 2015-05-18 | Disposition: A | Discharge status: Home / Self Care | Payer: Medicare Other | Source: Ambulatory Visit | Attending: Internal Medicine | Admitting: Internal Medicine

## 2015-05-18 ENCOUNTER — Ambulatory Visit (HOSPITAL_BASED_OUTPATIENT_CLINIC_OR_DEPARTMENT_OTHER): Payer: Medicare Other

## 2015-05-18 ENCOUNTER — Ambulatory Visit (HOSPITAL_COMMUNITY)
Admission: RE | Admit: 2015-05-18 | Discharge: 2015-05-18 | Disposition: A | Discharge status: Home / Self Care | Payer: Medicare Other | Source: Ambulatory Visit | Attending: Physician Assistant | Admitting: Physician Assistant

## 2015-05-18 ENCOUNTER — Other Ambulatory Visit (HOSPITAL_BASED_OUTPATIENT_CLINIC_OR_DEPARTMENT_OTHER): Payer: Medicare Other

## 2015-05-18 DIAGNOSIS — M47814 Spondylosis without myelopathy or radiculopathy, thoracic region: Secondary | ICD-10-CM | POA: Insufficient documentation

## 2015-05-18 DIAGNOSIS — C3491 Malignant neoplasm of unspecified part of right bronchus or lung: Secondary | ICD-10-CM

## 2015-05-18 DIAGNOSIS — C7A1 Malignant poorly differentiated neuroendocrine tumors: Secondary | ICD-10-CM

## 2015-05-18 DIAGNOSIS — Z452 Encounter for adjustment and management of vascular access device: Secondary | ICD-10-CM

## 2015-05-18 DIAGNOSIS — Z95828 Presence of other vascular implants and grafts: Secondary | ICD-10-CM

## 2015-05-18 LAB — COMPREHENSIVE METABOLIC PANEL
ALK PHOS: 88 U/L (ref 38–126)
ALT: 19 U/L (ref 17–63)
ANION GAP: 10 (ref 5–15)
AST: 22 U/L (ref 15–41)
Albumin: 4.1 g/dL (ref 3.5–5.0)
BILIRUBIN TOTAL: 0.7 mg/dL (ref 0.3–1.2)
BUN: 26 mg/dL — AB (ref 6–20)
CALCIUM: 9.3 mg/dL (ref 8.9–10.3)
CO2: 26 mmol/L (ref 22–32)
Chloride: 103 mmol/L (ref 101–111)
Creatinine, Ser: 1.09 mg/dL (ref 0.61–1.24)
GFR calc Af Amer: >60 mL/min (ref >60)
GLUCOSE: 118 mg/dL — AB (ref 65–99)
POTASSIUM: 4.1 mmol/L (ref 3.5–5.1)
Sodium: 139 mmol/L (ref 135–145)
TOTAL PROTEIN: 7 g/dL (ref 6.5–8.1)

## 2015-05-18 LAB — CBC WITH DIFFERENTIAL/PLATELET
BASO%: 0.4 % (ref 0.0–2.0)
Basophils Absolute: 0 10*3/uL (ref 0.0–0.1)
EOS%: 0.6 % (ref 0.0–7.0)
Eosinophils Absolute: 0 10*3/uL (ref 0.0–0.5)
HCT: 38.9 % (ref 38.4–49.9)
HGB: 12.9 g/dL — ABNORMAL LOW (ref 13.0–17.1)
LYMPH%: 7.6 % — AB (ref 14.0–49.0)
MCH: 31.9 pg (ref 27.2–33.4)
MCHC: 33.1 g/dL (ref 32.0–36.0)
MCV: 96.6 fL (ref 79.3–98.0)
MONO#: 0.6 10*3/uL (ref 0.1–0.9)
MONO%: 10 % (ref 0.0–14.0)
NEUT%: 81.4 % — ABNORMAL HIGH (ref 39.0–75.0)
NEUTROS ABS: 5 10*3/uL (ref 1.5–6.5)
Platelets: 125 10*3/uL — ABNORMAL LOW (ref 140–400)
RBC: 4.03 10*6/uL — AB (ref 4.20–5.82)
RDW: 13.6 % (ref 11.0–14.6)
WBC: 6.2 10*3/uL (ref 4.0–10.3)
lymph#: 0.5 10*3/uL — ABNORMAL LOW (ref 0.9–3.3)

## 2015-05-18 MED ORDER — SODIUM CHLORIDE 0.9 % IJ SOLN
10.0000 mL | INTRAMUSCULAR | Status: AC | PRN
  Administered 2015-05-18: 10 mL via INTRAVENOUS
  Filled 2015-05-18: qty 10

## 2015-05-18 MED ORDER — HEPARIN SOD (PORK) LOCK FLUSH 100 UNIT/ML IV SOLN
500.0000 [IU] | Freq: Once | INTRAVENOUS | Status: DC
  Filled 2015-05-18: qty 5

## 2015-05-18 MED ORDER — SODIUM CHLORIDE 0.9 % IJ SOLN
10.0000 mL | INTRAMUSCULAR | Status: DC | PRN
  Administered 2015-05-18: 10 mL via INTRAVENOUS
  Filled 2015-05-18: qty 10

## 2015-05-18 MED ORDER — HEPARIN SOD (PORK) LOCK FLUSH 100 UNIT/ML IV SOLN
500.0000 [IU] | Freq: Once | INTRAVENOUS | Status: AC
  Administered 2015-05-18: 500 [IU] via INTRAVENOUS
  Filled 2015-05-18: qty 5

## 2015-05-18 NOTE — Patient Instructions (Signed)
Patient to have port needle de accessed after CT scan today at [place] An implanted port is a type of central line that is placed under the skin. Central lines are used to provide IV access when treatment or nutrition needs to be given through a person's veins. Implanted ports are used for long-term IV access. An implanted port may be placed because:   You need IV medicine that would be irritating to the small veins in your hands or arms.   You need long-term IV medicines, such as antibiotics.   You need IV nutrition for a long period.   You need frequent blood draws for lab tests.   You need dialysis.  Implanted ports are usually placed in the chest area, but they can also be placed in the upper arm, the abdomen, or the leg. An implanted port has two main parts:   Reservoir. The reservoir is round and will appear as a small, raised area under your skin. The reservoir is the part where a needle is inserted to give medicines or draw blood.   Catheter. The catheter is a thin, flexible tube that extends from the reservoir. The catheter is placed into a large vein. Medicine that is inserted into the reservoir goes into the catheter and then into the vein.  HOW WILL I CARE FOR MY INCISION SITE? Do not get the incision site wet. Bathe or shower as directed by your health care provider.  HOW IS MY PORT ACCESSED? Special steps must be taken to access the port:   Before the port is accessed, a numbing cream can be placed on the skin. This helps numb the skin over the port site.   Your health care provider uses a sterile technique to access the port.  Your health care provider must put on a mask and sterile gloves.  The skin over your port is cleaned carefully with an antiseptic and allowed to dry.  The port is gently pinched between sterile gloves, and a needle is inserted into the port.  Only "non-coring" port needles should be used to access  the port. Once the port is accessed, a blood return should be checked. This helps ensure that the port is in the vein and is not clogged.   If your port needs to remain accessed for a constant infusion, a clear (transparent) bandage will be placed over the needle site. The bandage and needle will need to be changed every week, or as directed by your health care provider.   Keep the bandage covering the needle clean and dry. Do not get it wet. Follow your health care provider's instructions on how to take a shower or bath while the port is accessed.   If your port does not need to stay accessed, no bandage is needed over the port.  WHAT IS FLUSHING? Flushing helps keep the port from getting clogged. Follow your health care provider's instructions on how and when to flush the port. Ports are usually flushed with saline solution or a medicine called heparin. The need for flushing will depend on how the port is used.   If the port is used for intermittent medicines or blood draws, the port will need to be flushed:   After medicines have been given.   After blood has been drawn.   As part of routine maintenance.   If a constant infusion is running, the port may not need to be flushed.  HOW LONG WILL  MY PORT STAY IMPLANTED? The port can stay in for as long as your health care provider thinks it is needed. When it is time for the port to come out, surgery will be done to remove it. The procedure is similar to the one performed when the port was put in.  WHEN SHOULD I SEEK IMMEDIATE MEDICAL CARE? When you have an implanted port, you should seek immediate medical care if:   You notice a bad smell coming from the incision site.   You have swelling, redness, or drainage at the incision site.   You have more swelling or pain at the port site or the surrounding area.   You have a fever that is not controlled with medicine.   This information is not intended to replace advice given to  you by your health care provider. Make sure you discuss any questions you have with your health care provider.   Document Released: 05/12/2005 Document Revised: 03/02/2013 Document Reviewed: 01/17/2013 Elsevier Interactive Patient Education [employer].

## 2015-05-18 NOTE — Addendum Note (Signed)
Addended by: Azzie Glatter on: 05/18/2015 12:57 PM   Modules accepted: Orders, SmartSet

## 2015-05-23 ENCOUNTER — Telehealth: Payer: Self-pay | Admitting: Internal Medicine

## 2015-05-23 ENCOUNTER — Other Ambulatory Visit: Payer: Medicare Other

## 2015-05-23 ENCOUNTER — Encounter: Payer: Self-pay | Admitting: Internal Medicine

## 2015-05-23 ENCOUNTER — Ambulatory Visit (HOSPITAL_BASED_OUTPATIENT_CLINIC_OR_DEPARTMENT_OTHER): Payer: Medicare Other | Admitting: Internal Medicine

## 2015-05-23 VITALS — BP 152/68 | HR 104 | Temp 98.3°F | Resp 18 | Ht 65.0 in | Wt 122.8 lb

## 2015-05-23 DIAGNOSIS — Z85118 Personal history of other malignant neoplasm of bronchus and lung: Secondary | ICD-10-CM | POA: Diagnosis not present

## 2015-05-23 DIAGNOSIS — I10 Essential (primary) hypertension: Secondary | ICD-10-CM

## 2015-05-23 DIAGNOSIS — C3491 Malignant neoplasm of unspecified part of right bronchus or lung: Secondary | ICD-10-CM

## 2015-05-23 NOTE — Progress Notes (Signed)
[place] Telephone:([phone]) [phone]   Fax:(336) [phone]  OFFICE PROGRESS NOTE  [name], MD [address] [place] [address]  DIAGNOSIS: Limited stage small cell lung cancer, stage IIA (T1a, N1, M0) presented with right lower lobe nodule in addition to right hilar lymphadenopathy diagnosed in August of 2015.  PRIOR THERAPY: Systemic chemotherapy with cisplatin 60 mg/M2 on day 1 and etoposide at 120 mg/M2 on days 1, 2 and 3 with Neulasta support on day 4. Status post 6 cycles, last dose was giving 05/23/2014 with partial response. He is not interested in prophylactic cranial irradiation after discussion with Dr. [name].  CURRENT THERAPY: Observation.  INTERVAL HISTORY: Patrick Carr Reason 81 y.o. male returns to the clinic today for followup visit accompanied by his wife and daughter. The patient is feeling fine today with no specific complaints. He enjoyed his Christmas holiday with his family. Unfortunately he lost his daughter to ovarian cancer few months ago. He denied having any fever or chills. He has no nausea or vomiting. He denied having any significant chest pain, shortness of breath, cough or hemoptysis. He has no significant weight loss or night sweats. He had repeat CT scan of the chest performed recently and he is here for evaluation and discussion of his scan results.  MEDICAL HISTORY: Past Medical History  Diagnosis Date  . Hypertension   . Hyperlipidemia   . BPH (benign prostatic hyperplasia)   . Lesion of right lung     RLL  . RBBB     no palpations  . Renal cysts, acquired, bilateral   . History of kidney stones   . Hernia, inguinal left   . History of blood transfusion     artery in sinus bleed  . Phlebitis 03/20/14    bilateral forearms  . Radiation 02/08/14-03/21/14    Stage III lung cancer 30 fractions  . Irregular heart rate   . Cancer (HCC)     Lung    ALLERGIES:  is allergic to contrast media;  other; and pneumococcal vaccines.  MEDICATIONS:  Current Outpatient Prescriptions  Medication Sig Dispense Refill  . alfuzosin (UROXATRAL) 10 MG 24 hr tablet Take 10 mg by mouth at bedtime.     . ALPRAZolam (XANAX) 0.25 MG tablet Take 0.25 mg by mouth at bedtime as needed for anxiety.    [name] amLODipine (NORVASC) 5 MG tablet Take 5 mg by mouth at bedtime.     [name] aspirin 81 MG tablet Take 81 mg by mouth at bedtime.     [name] atorvastatin (LIPITOR) 40 MG tablet Take 40 mg by mouth at bedtime.     . diphenhydrAMINE (BENADRYL) 25 MG tablet Take 1 tablet (25 mg total) by mouth every 6 (six) hours as needed for itching (Rash). (Patient not taking: Reported on 11/21/2014) 30 tablet 0  . famotidine (PEPCID) 20 MG tablet Take 1 tablet (20 mg total) by mouth 2 (two) times daily. 30 tablet 0  . lidocaine-prilocaine (EMLA) cream Apply 1 application topically as needed. Apply over port site 1 hour prior to chemo. Do not rub in medicine. 30 g 0  . tamsulosin (FLOMAX) 0.4 MG CAPS capsule Take 0.4 mg by mouth at bedtime.      No current facility-administered medications for this visit.   Facility-Administered Medications Ordered in Other Visits  Medication Dose Route Frequency Provider Last Rate Last Dose  . sodium chloride 0.9 % injection 10 mL  10 mL Intravenous PRN Julien Nordmann  Maxen Rowland, MD   10 mL at 05/23/14 1635  . sodium chloride 0.9 % injection 10 mL  10 mL Intravenous PRN [name], MD   10 mL at 05/18/15 1255    SURGICAL HISTORY:  Past Surgical History  Procedure Laterality Date  . Sinus artery surgery      not surgery  . No past surgeries    . Video bronchoscopy with endobronchial navigation N/A 01/18/2014    Procedure: VIDEO BRONCHOSCOPY WITH ENDOBRONCHIAL NAVIGATION;  Surgeon: [name], MD;  Location: [facility] OR;  Service: Thoracic;  Laterality: N/A;  . Video bronchoscopy with endobronchial ultrasound N/A 01/18/2014    Procedure: VIDEO BRONCHOSCOPY WITH ENDOBRONCHIAL ULTRASOUND;  Surgeon:  [name], MD;  Location: [place];  Service: Thoracic;  Laterality: N/A;  . Video bronchoscopy N/A 07/04/2014    Procedure: VIDEO BRONCHOSCOPY WITH  ENDOBRONCIAL BIOPSY;  Surgeon: [name], MD;  Location: [place];  Service: Thoracic;  Laterality: N/A;    REVIEW OF SYSTEMS:  A comprehensive review of systems was negative.   PHYSICAL EXAMINATION: General appearance: alert, cooperative and no distress Head: Normocephalic, without obvious abnormality, atraumatic Neck: no adenopathy, no JVD, supple, symmetrical, trachea midline and thyroid not enlarged, symmetric, no tenderness/mass/nodules Lymph nodes: Cervical, supraclavicular, and axillary nodes normal. Resp: clear to auscultation bilaterally Back: symmetric, no curvature. ROM normal. No CVA tenderness. Cardio: regular rate and rhythm, S1, S2 normal, no murmur, click, rub or gallop GI: soft, non-tender; bowel sounds normal; no masses,  no organomegaly Extremities: extremities normal, atraumatic, no cyanosis or edema  ECOG PERFORMANCE STATUS: 1 - Symptomatic but completely ambulatory  Blood pressure 152/68, pulse 104, temperature 98.3 F (36.8 C), temperature source Oral, resp. rate 18, height '5\' 5"'$  (1.651 m), weight 122 lb 12.8 oz (55.702 kg), SpO2 96 %.  LABORATORY DATA: Lab Results  Component Value Date   WBC 6.2 05/18/2015   HGB 12.9* 05/18/2015   HCT 38.9 05/18/2015   MCV 96.6 05/18/2015   PLT 125* 05/18/2015      Chemistry      Component Value Date/Time   NA 139 05/18/2015 1048   NA 141 01/30/2015 1115   K 4.1 05/18/2015 1048   K 4.4 01/30/2015 1115   CL 103 05/18/2015 1048   CO2 26 05/18/2015 1048   CO2 28 01/30/2015 1115   BUN 26* 05/18/2015 1048   BUN 29.1* 01/30/2015 1115   CREATININE 1.09 05/18/2015 1048   CREATININE 1.3 01/30/2015 1115   CREATININE 0.80 01/12/2014 1519      Component Value Date/Time   CALCIUM 9.3 05/18/2015 1048   CALCIUM 9.4 01/30/2015 1115   ALKPHOS 88 05/18/2015 1048    ALKPHOS 79 01/30/2015 1115   AST 22 05/18/2015 1048   AST 25 01/30/2015 1115   ALT 19 05/18/2015 1048   ALT 17 01/30/2015 1115   BILITOT 0.7 05/18/2015 1048   BILITOT 0.65 01/30/2015 1115       RADIOGRAPHIC STUDIES: Ct Chest Wo Contrast  05/18/2015  CLINICAL DATA:  Small cell cancer of the RIGHT lung. EXAM: CT CHEST WITHOUT CONTRAST TECHNIQUE: Multidetector CT imaging of the chest was performed following the standard protocol without IV contrast. COMPARISON:  CT 02/20/2015 FINDINGS: Mediastinum/Nodes: No axillary or supraclavicular lymphadenopathy. No mediastinal hilar adenopathy. No pericardial fluid. Esophagus normal. Lungs/Pleura: Nodular thickening at the RIGHT lung apex is similar to comparison exam. The lesion measures 16 mm on image 43, series 602 compared to 14 mm on prior. In axial dimension lesion measures 22  by 12 mm compared to 20 by 11 mm. In comparison to more remote scan of 08/11/2014, lesion stable. New ill-defined linear nodular opacity in the posterior aspect of the RIGHT lower lobe best seen on coronal image 62, series 602. This has a post radiation pattern. Within the superior segment of the RIGHT lower lobe previous described nodule is less well-defined measuring 4 mm on image 27, series 5 compared to 5 mm on prior (remeasured). Upper abdomen: Limited view of the liver, kidneys, pancreas are unremarkable. Normal adrenal glands. Sternal high-density lesions in the LEFT kidney Musculoskeletal: Stable sclerotic changes within the thoracic spine which are likely degenerative IMPRESSION: 1. Stable apical nodularity in the RIGHT upper lobe. 2. Increased linear bandlike thickening in the RIGHT lower lobe suggests radiation change. 3. Recommend attention on follow-up CT these evolving RIGHT lung parenchymal findings. 4. No evidence disease progression. Electronically Signed   By: Suzy Bouchard M.D.   On: 05/18/2015 14:41    ASSESSMENT AND PLAN: This is a very pleasant 81 years old  white male recently diagnosed with limited stage small cell lung cancer and currently undergoing systemic chemotherapy with cisplatin and etoposide status post 6 cycle. He tolerated his treatment fairly well with no significant adverse effects. He has been observation for more than a year and has been doing very well. The recent CT scan of the chest showed no evidence for disease recurrence. I discussed the scan results with the patient and his family. I recommended for him to continue on observation with repeat CT scan of the chest in 3 months. He will continue to have Port-A-Cath flush every 6 weeks. He was advised to call immediately if he has any concerning symptoms in the interval. The patient voices understanding of current disease status and treatment options and is in agreement with the current care plan.  All questions were answered. The patient knows to call the clinic with any problems, questions or concerns. We can certainly see the patient much sooner if necessary.  Disclaimer: This note was dictated with voice recognition software. Similar sounding words can inadvertently be transcribed and may not be corrected upon review.

## 2015-05-23 NOTE — Telephone Encounter (Signed)
Gave patient avs report and appointments for March. [facility] will call re ct - patient aware.

## 2015-06-06 ENCOUNTER — Other Ambulatory Visit: Payer: Medicare Other

## 2015-06-06 ENCOUNTER — Ambulatory Visit (HOSPITAL_COMMUNITY): Payer: Medicare Other

## 2015-06-12 ENCOUNTER — Other Ambulatory Visit: Payer: Self-pay | Admitting: Medical Oncology

## 2015-06-12 DIAGNOSIS — C3491 Malignant neoplasm of unspecified part of right bronchus or lung: Secondary | ICD-10-CM

## 2015-06-13 ENCOUNTER — Ambulatory Visit: Payer: Medicare Other | Admitting: Internal Medicine

## 2015-07-04 ENCOUNTER — Ambulatory Visit (HOSPITAL_BASED_OUTPATIENT_CLINIC_OR_DEPARTMENT_OTHER): Payer: Medicare Other

## 2015-07-04 VITALS — BP 127/74 | HR 87 | Temp 97.5°F | Resp 18

## 2015-07-04 DIAGNOSIS — Z85118 Personal history of other malignant neoplasm of bronchus and lung: Secondary | ICD-10-CM

## 2015-07-04 DIAGNOSIS — Z95828 Presence of other vascular implants and grafts: Secondary | ICD-10-CM

## 2015-07-04 DIAGNOSIS — Z452 Encounter for adjustment and management of vascular access device: Secondary | ICD-10-CM

## 2015-07-04 MED ORDER — HEPARIN SOD (PORK) LOCK FLUSH 100 UNIT/ML IV SOLN
500.0000 [IU] | Freq: Once | INTRAVENOUS | Status: AC
  Administered 2015-07-04: 500 [IU] via INTRAVENOUS
  Filled 2015-07-04: qty 5

## 2015-07-04 MED ORDER — SODIUM CHLORIDE 0.9% FLUSH
10.0000 mL | INTRAVENOUS | Status: DC | PRN
  Administered 2015-07-04: 10 mL via INTRAVENOUS
  Filled 2015-07-04: qty 10

## 2015-07-04 NOTE — Patient Instructions (Signed)

## 2015-08-17 ENCOUNTER — Ambulatory Visit: Payer: Medicare Other

## 2015-08-17 ENCOUNTER — Other Ambulatory Visit (HOSPITAL_BASED_OUTPATIENT_CLINIC_OR_DEPARTMENT_OTHER): Payer: Medicare Other

## 2015-08-17 ENCOUNTER — Ambulatory Visit (HOSPITAL_COMMUNITY)
Admission: RE | Admit: 2015-08-17 | Discharge: 2015-08-17 | Disposition: A | Discharge status: Home / Self Care | Payer: Medicare Other | Source: Ambulatory Visit | Attending: Internal Medicine | Admitting: Internal Medicine

## 2015-08-17 VITALS — BP 107/61 | HR 98 | Temp 97.5°F | Resp 18

## 2015-08-17 DIAGNOSIS — C3491 Malignant neoplasm of unspecified part of right bronchus or lung: Secondary | ICD-10-CM | POA: Diagnosis not present

## 2015-08-17 DIAGNOSIS — Z95828 Presence of other vascular implants and grafts: Secondary | ICD-10-CM

## 2015-08-17 DIAGNOSIS — I10 Essential (primary) hypertension: Secondary | ICD-10-CM

## 2015-08-17 DIAGNOSIS — R911 Solitary pulmonary nodule: Secondary | ICD-10-CM | POA: Diagnosis not present

## 2015-08-17 LAB — COMPREHENSIVE METABOLIC PANEL
ALT: 17 U/L (ref 0–55)
ANION GAP: 8 meq/L (ref 3–11)
AST: 21 U/L (ref 5–34)
Albumin: 3.9 g/dL (ref 3.5–5.0)
Alkaline Phosphatase: 69 U/L (ref 40–150)
BUN: 20.9 mg/dL (ref 7.0–26.0)
CALCIUM: 9.3 mg/dL (ref 8.4–10.4)
CHLORIDE: 104 meq/L (ref 98–109)
CO2: 28 meq/L (ref 22–29)
CREATININE: 1.2 mg/dL (ref 0.7–1.3)
EGFR: 55 mL/min/{1.73_m2} — AB (ref >90)
Glucose: 222 mg/dl — ABNORMAL HIGH (ref 70–140)
Potassium: 3.9 mEq/L (ref 3.5–5.1)
Sodium: 140 mEq/L (ref 136–145)
Total Bilirubin: 0.62 mg/dL (ref 0.20–1.20)
Total Protein: 6.8 g/dL (ref 6.4–8.3)

## 2015-08-17 LAB — CBC WITH DIFFERENTIAL/PLATELET
BASO%: 0.4 % (ref 0.0–2.0)
BASOS ABS: 0 10*3/uL (ref 0.0–0.1)
EOS ABS: 0 10*3/uL (ref 0.0–0.5)
EOS%: 0.7 % (ref 0.0–7.0)
HEMATOCRIT: 39.4 % (ref 38.4–49.9)
HEMOGLOBIN: 13.1 g/dL (ref 13.0–17.1)
LYMPH#: 0.4 10*3/uL — AB (ref 0.9–3.3)
LYMPH%: 7.6 % — ABNORMAL LOW (ref 14.0–49.0)
MCH: 31.9 pg (ref 27.2–33.4)
MCHC: 33.1 g/dL (ref 32.0–36.0)
MCV: 96.3 fL (ref 79.3–98.0)
MONO#: 0.5 10*3/uL (ref 0.1–0.9)
MONO%: 7.9 % (ref 0.0–14.0)
NEUT#: 4.9 10*3/uL (ref 1.5–6.5)
NEUT%: 83.4 % — AB (ref 39.0–75.0)
PLATELETS: 133 10*3/uL — AB (ref 140–400)
RBC: 4.09 10*6/uL — ABNORMAL LOW (ref 4.20–5.82)
RDW: 14.2 % (ref 11.0–14.6)
WBC: 5.9 10*3/uL (ref 4.0–10.3)

## 2015-08-17 MED ORDER — SODIUM CHLORIDE 0.9% FLUSH
10.0000 mL | INTRAVENOUS | Status: DC | PRN
  Administered 2015-08-17: 10 mL via INTRAVENOUS
  Filled 2015-08-17: qty 10

## 2015-08-17 NOTE — Patient Instructions (Signed)
Patient to have port needle de-accessed after CT scan today, before leaving [employer]. Patient to come back to flush nurse if needle is left in after scan.   Implanted [facility] Guide An implanted port is a type of central line that is placed under the skin. Central lines are used to provide IV access when treatment or nutrition needs to be given through a person's veins. Implanted ports are used for long-term IV access. An implanted port may be placed because:   You need IV medicine that would be irritating to the small veins in your hands or arms.   You need long-term IV medicines, such as antibiotics.   You need IV nutrition for a long period.   You need frequent blood draws for lab tests.   You need dialysis.  Implanted ports are usually placed in the chest area, but they can also be placed in the upper arm, the abdomen, or the leg. An implanted port has two main parts:   Reservoir. The reservoir is round and will appear as a small, raised area under your skin. The reservoir is the part where a needle is inserted to give medicines or draw blood.   Catheter. The catheter is a thin, flexible tube that extends from the reservoir. The catheter is placed into a large vein. Medicine that is inserted into the reservoir goes into the catheter and then into the vein.  HOW WILL I CARE FOR MY INCISION SITE? Do not get the incision site wet. Bathe or shower as directed by your health care provider.  HOW IS MY PORT ACCESSED? Special steps must be taken to access the port:   Before the port is accessed, a numbing cream can be placed on the skin. This helps numb the skin over the port site.   Your health care provider uses a sterile technique to access the port.  Your health care provider must put on a mask and sterile gloves.  The skin over your port is cleaned carefully with an antiseptic and allowed to dry.  The port is gently pinched between sterile gloves, and a  needle is inserted into the port.  Only "non-coring" port needles should be used to access the port. Once the port is accessed, a blood return should be checked. This helps ensure that the port is in the vein and is not clogged.   If your port needs to remain accessed for a constant infusion, a clear (transparent) bandage will be placed over the needle site. The bandage and needle will need to be changed every week, or as directed by your health care provider.   Keep the bandage covering the needle clean and dry. Do not get it wet. Follow your health care provider's instructions on how to take a shower or bath while the port is accessed.   If your port does not need to stay accessed, no bandage is needed over the port.  WHAT IS FLUSHING? Flushing helps keep the port from getting clogged. Follow your health care provider's instructions on how and when to flush the port. Ports are usually flushed with saline solution or a medicine called heparin. The need for flushing will depend on how the port is used.   If the port is used for intermittent medicines or blood draws, the port will need to be flushed:   After medicines have been given.   After blood has been drawn.   As part of routine maintenance.   If a  constant infusion is running, the port may not need to be flushed.  HOW LONG WILL MY PORT STAY IMPLANTED? The port can stay in for as long as your health care provider thinks it is needed. When it is time for the port to come out, surgery will be done to remove it. The procedure is similar to the one performed when the port was put in.  WHEN SHOULD I SEEK IMMEDIATE MEDICAL CARE? When you have an implanted port, you should seek immediate medical care if:   You notice a bad smell coming from the incision site.   You have swelling, redness, or drainage at the incision site.   You have more swelling or pain at the port site or the surrounding area.   You have a fever that is  not controlled with medicine.   This information is not intended to replace advice given to you by your health care provider. Make sure you discuss any questions you have with your health care provider.   Document Released: 05/12/2005 Document Revised: 03/02/2013 Document Reviewed: 01/17/2013 Elsevier Interactive Patient Education [employer].

## 2015-08-23 ENCOUNTER — Telehealth: Payer: Self-pay | Admitting: Internal Medicine

## 2015-08-23 ENCOUNTER — Encounter: Payer: Self-pay | Admitting: Internal Medicine

## 2015-08-23 ENCOUNTER — Other Ambulatory Visit: Payer: Self-pay | Admitting: Medical Oncology

## 2015-08-23 ENCOUNTER — Ambulatory Visit (HOSPITAL_BASED_OUTPATIENT_CLINIC_OR_DEPARTMENT_OTHER): Payer: Medicare Other | Admitting: Internal Medicine

## 2015-08-23 VITALS — BP 113/68 | HR 95 | Temp 97.3°F | Resp 18 | Ht 65.0 in | Wt 122.6 lb

## 2015-08-23 DIAGNOSIS — C3491 Malignant neoplasm of unspecified part of right bronchus or lung: Secondary | ICD-10-CM

## 2015-08-23 DIAGNOSIS — Z85118 Personal history of other malignant neoplasm of bronchus and lung: Secondary | ICD-10-CM | POA: Diagnosis not present

## 2015-08-23 DIAGNOSIS — Z95828 Presence of other vascular implants and grafts: Secondary | ICD-10-CM

## 2015-08-23 MED ORDER — LIDOCAINE-PRILOCAINE 2.5-2.5 % EX CREA: 1.0000 "application " | TOPICAL_CREAM | CUTANEOUS | Status: DC | PRN

## 2015-08-23 MED ORDER — ALPRAZOLAM 0.25 MG PO TABS: 0.2500 mg | ORAL_TABLET | Freq: Every evening | ORAL | Status: AC | PRN

## 2015-08-23 NOTE — Progress Notes (Signed)
[place] Telephone:(336) [phone]   Fax:(336) [phone]  OFFICE PROGRESS NOTE  [name], MD [address] [place] [address]  DIAGNOSIS: Limited stage small cell lung cancer, stage IIA (T1a, N1, M0) presented with right lower lobe nodule in addition to right hilar lymphadenopathy diagnosed in August of 2015.  PRIOR THERAPY: Systemic chemotherapy with cisplatin 60 mg/M2 on day 1 and etoposide at 120 mg/M2 on days 1, 2 and 3 with Neulasta support on day 4. Status post 6 cycles, last dose was giving 05/23/2014 with partial response. He is not interested in prophylactic cranial irradiation after discussion with Dr. [name].  CURRENT THERAPY: Observation.  INTERVAL HISTORY: Patrick Carr 82 y.o. male returns to the clinic today for followup visit accompanied by his wife. The patient is feeling fine today with no specific complaints. Has been observation for almost 18 months. He denied having any fever or chills. He has no nausea or vomiting. He denied having any significant chest pain, shortness of breath, cough or hemoptysis. He has no significant weight loss or night sweats. He had repeat CT scan of the chest performed recently and he is here for evaluation and discussion of his scan results.  MEDICAL HISTORY: Past Medical History  Diagnosis Date  . Hypertension   . Hyperlipidemia   . BPH (benign prostatic hyperplasia)   . Lesion of right lung     RLL  . RBBB     no palpations  . Renal cysts, acquired, bilateral   . History of kidney stones   . Hernia, inguinal left   . History of blood transfusion     artery in sinus bleed  . Phlebitis 03/20/14    bilateral forearms  . Radiation 02/08/14-03/21/14    Stage III lung cancer 30 fractions  . Irregular heart rate   . Cancer (HCC)     Lung    ALLERGIES:  is allergic to contrast media; other; and pneumococcal vaccines.  MEDICATIONS:  Current Outpatient Prescriptions    Medication Sig Dispense Refill  . alfuzosin (UROXATRAL) 10 MG 24 hr tablet Take 10 mg by mouth at bedtime.     . ALPRAZolam (XANAX) 0.25 MG tablet Take 0.25 mg by mouth at bedtime as needed for anxiety.    [name] amLODipine (NORVASC) 5 MG tablet Take 5 mg by mouth at bedtime.     [name] aspirin 81 MG tablet Take 81 mg by mouth at bedtime.     [name] atorvastatin (LIPITOR) 40 MG tablet Take 40 mg by mouth at bedtime.     . diphenhydrAMINE (BENADRYL) 25 MG tablet Take 1 tablet (25 mg total) by mouth every 6 (six) hours as needed for itching (Rash). (Patient not taking: Reported on 11/21/2014) 30 tablet 0  . famotidine (PEPCID) 20 MG tablet Take 1 tablet (20 mg total) by mouth 2 (two) times daily. 30 tablet 0  . lidocaine-prilocaine (EMLA) cream Apply 1 application topically as needed. Apply over port site 1 hour prior to chemo. Do not rub in medicine. 30 g 0  . tamsulosin (FLOMAX) 0.4 MG CAPS capsule Take 0.4 mg by mouth at bedtime.      No current facility-administered medications for this visit.   Facility-Administered Medications Ordered in Other Visits  Medication Dose Route Frequency Provider Last Rate Last Dose  . sodium chloride 0.9 % injection 10 mL  10 mL Intravenous PRN [name], MD   10 mL at 05/23/14 1635  . sodium chloride  0.9 % injection 10 mL  10 mL Intravenous PRN [name], MD   10 mL at 05/18/15 1255    SURGICAL HISTORY:  Past Surgical History  Procedure Laterality Date  . Sinus artery surgery      not surgery  . No past surgeries    . Video bronchoscopy with endobronchial navigation N/A 01/18/2014    Procedure: VIDEO BRONCHOSCOPY WITH ENDOBRONCHIAL NAVIGATION;  Surgeon: [name], MD;  Location: [facility] OR;  Service: Thoracic;  Laterality: N/A;  . Video bronchoscopy with endobronchial ultrasound N/A 01/18/2014    Procedure: VIDEO BRONCHOSCOPY WITH ENDOBRONCHIAL ULTRASOUND;  Surgeon: [name], MD;  Location: [place];  Service: Thoracic;  Laterality: N/A;  . Video  bronchoscopy N/A 07/04/2014    Procedure: VIDEO BRONCHOSCOPY WITH  ENDOBRONCIAL BIOPSY;  Surgeon: [name], MD;  Location: [place];  Service: Thoracic;  Laterality: N/A;    REVIEW OF SYSTEMS:  A comprehensive review of systems was negative.   PHYSICAL EXAMINATION: General appearance: alert, cooperative and no distress Head: Normocephalic, without obvious abnormality, atraumatic Neck: no adenopathy, no JVD, supple, symmetrical, trachea midline and thyroid not enlarged, symmetric, no tenderness/mass/nodules Lymph nodes: Cervical, supraclavicular, and axillary nodes normal. Resp: clear to auscultation bilaterally Back: symmetric, no curvature. ROM normal. No CVA tenderness. Cardio: regular rate and rhythm, S1, S2 normal, no murmur, click, rub or gallop GI: soft, non-tender; bowel sounds normal; no masses,  no organomegaly Extremities: extremities normal, atraumatic, no cyanosis or edema  ECOG PERFORMANCE STATUS: 1 - Symptomatic but completely ambulatory  Blood pressure 113/68, pulse 95, temperature 97.3 F (36.3 C), temperature source Oral, resp. rate 18, height '5\' 5"'$  (1.651 m), weight 122 lb 9.6 oz (55.611 kg), SpO2 97 %.  LABORATORY DATA: Lab Results  Component Value Date   WBC 5.9 08/17/2015   HGB 13.1 08/17/2015   HCT 39.4 08/17/2015   MCV 96.3 08/17/2015   PLT 133* 08/17/2015      Chemistry      Component Value Date/Time   NA 140 08/17/2015 1033   NA 139 05/18/2015 1048   K 3.9 08/17/2015 1033   K 4.1 05/18/2015 1048   CL 103 05/18/2015 1048   CO2 28 08/17/2015 1033   CO2 26 05/18/2015 1048   BUN 20.9 08/17/2015 1033   BUN 26* 05/18/2015 1048   CREATININE 1.2 08/17/2015 1033   CREATININE 1.09 05/18/2015 1048   CREATININE 0.80 01/12/2014 1519      Component Value Date/Time   CALCIUM 9.3 08/17/2015 1033   CALCIUM 9.3 05/18/2015 1048   ALKPHOS 69 08/17/2015 1033   ALKPHOS 88 05/18/2015 1048   AST 21 08/17/2015 1033   AST 22 05/18/2015 1048   ALT 17 08/17/2015  1033   ALT 19 05/18/2015 1048   BILITOT 0.62 08/17/2015 1033   BILITOT 0.7 05/18/2015 1048       RADIOGRAPHIC STUDIES: Ct Chest Wo Contrast  08/17/2015  CLINICAL DATA:  Right lung cancer diagnosed in August 2015. Chemotherapy and radiation therapy completed 15 months ago. No current complaints. EXAM: CT CHEST WITHOUT CONTRAST TECHNIQUE: Multidetector CT imaging of the chest was performed following the standard protocol without IV contrast. COMPARISON:  Chest CT 05/18/2015 and 02/20/2015 FINDINGS: Mediastinum/Nodes: There are no enlarged mediastinal, hilar or axillary lymph nodes.Hilar assessment is limited by the lack of intravenous contrast, although the hilar contours appear unchanged. The thyroid gland, trachea and esophagus demonstrate no significant findings. The heart size is normal. There is no pericardial effusion. There is extensive  atherosclerosis of the aorta, great vessels and coronary arteries. Right IJ Port-A-Cath extends to the SVC right atrial junction. Lungs/Pleura: There is no pleural effusion. Diffuse changes of centrilobular emphysema are again noted. The partially calcified right apical lesion appears unchanged, measuring 2.2 x 1.3 cm on image 7. There is stable probable radiation change in the superior segment of the right lower lobe on images 27-30. The underlying 9 x 6 mm nodular component on image 29 also appears stable, best seen on the reformatted images. No new or enlarging pulmonary nodules are identified. There is no confluent airspace opacity or endobronchial lesion. Upper abdomen: The visualized upper abdomen appears stable. There are several small hyperdense left renal lesions which are grossly stable. Musculoskeletal/Chest wall: There is no chest wall mass or suspicious osseous finding. The bones are demineralized with stable discogenic sclerosis in the spine. Old rib fractures are present on the right. IMPRESSION: 1. Stable right lower lobe pulmonary nodule and  surrounding radiation changes compared with most recent study. 2. Stable partially calcified right apical lesion. 3. No evidence of metastatic disease or acute findings. 4. Diffuse emphysema and atherosclerosis. Electronically Signed   By: Richardean Sale M.D.   On: 08/17/2015 13:19    ASSESSMENT AND PLAN: This is a very pleasant 82 years old white male recently diagnosed with limited stage small cell lung cancer and currently undergoing systemic chemotherapy with cisplatin and etoposide status post 6 cycle. He tolerated his treatment fairly well with no significant adverse effects. He has been observation for the last 18 months. The patient is feeling fine with no complaints. The recent CT scan of the chest showed no evidence for disease recurrence. I discussed the scan results with the patient and his wife. I recommended for him to continue on observation with repeat CT scan of the chest in 6 months. He will continue to have Port-A-Cath flush every 8 weeks. He was advised to call immediately if he has any concerning symptoms in the interval. The patient voices understanding of current disease status and treatment options and is in agreement with the current care plan.  All questions were answered. The patient knows to call the clinic with any problems, questions or concerns. We can certainly see the patient much sooner if necessary.  Disclaimer: This note was dictated with voice recognition software. Similar sounding words can inadvertently be transcribed and may not be corrected upon review.

## 2015-08-23 NOTE — Telephone Encounter (Signed)
Gave and printed appt shced and avs for pt for May, July and Sept

## 2015-10-12 ENCOUNTER — Telehealth: Payer: Self-pay | Admitting: Internal Medicine

## 2015-10-12 ENCOUNTER — Other Ambulatory Visit: Payer: Self-pay

## 2015-10-12 DIAGNOSIS — Z95828 Presence of other vascular implants and grafts: Secondary | ICD-10-CM | POA: Insufficient documentation

## 2015-10-12 NOTE — Telephone Encounter (Signed)
pt wife call to r/s appt due to pt not feeling well today....sched per request....pt ok and aware of new d.t

## 2015-10-15 ENCOUNTER — Telehealth: Payer: Self-pay | Admitting: Internal Medicine

## 2015-10-15 ENCOUNTER — Ambulatory Visit (HOSPITAL_BASED_OUTPATIENT_CLINIC_OR_DEPARTMENT_OTHER): Payer: Medicare Other

## 2015-10-15 DIAGNOSIS — Z85118 Personal history of other malignant neoplasm of bronchus and lung: Secondary | ICD-10-CM | POA: Diagnosis not present

## 2015-10-15 DIAGNOSIS — Z452 Encounter for adjustment and management of vascular access device: Secondary | ICD-10-CM

## 2015-10-15 DIAGNOSIS — C3491 Malignant neoplasm of unspecified part of right bronchus or lung: Secondary | ICD-10-CM

## 2015-10-15 DIAGNOSIS — Z95828 Presence of other vascular implants and grafts: Secondary | ICD-10-CM

## 2015-10-15 MED ORDER — HEPARIN SOD (PORK) LOCK FLUSH 100 UNIT/ML IV SOLN
500.0000 [IU] | Freq: Once | INTRAVENOUS | Status: AC | PRN
  Administered 2015-10-15: 500 [IU] via INTRAVENOUS
  Filled 2015-10-15: qty 5

## 2015-10-15 MED ORDER — SODIUM CHLORIDE 0.9 % IJ SOLN
10.0000 mL | INTRAMUSCULAR | Status: DC | PRN
  Administered 2015-10-15: 10 mL via INTRAVENOUS
  Filled 2015-10-15: qty 10

## 2015-10-15 NOTE — Telephone Encounter (Signed)
pt stopped to get updated sch-printed

## 2015-10-15 NOTE — Patient Instructions (Signed)

## 2015-12-07 ENCOUNTER — Ambulatory Visit (HOSPITAL_BASED_OUTPATIENT_CLINIC_OR_DEPARTMENT_OTHER): Payer: Medicare Other

## 2015-12-07 DIAGNOSIS — C3491 Malignant neoplasm of unspecified part of right bronchus or lung: Secondary | ICD-10-CM

## 2015-12-07 DIAGNOSIS — Z85118 Personal history of other malignant neoplasm of bronchus and lung: Secondary | ICD-10-CM | POA: Diagnosis not present

## 2015-12-07 DIAGNOSIS — Z452 Encounter for adjustment and management of vascular access device: Secondary | ICD-10-CM | POA: Diagnosis not present

## 2015-12-07 DIAGNOSIS — Z95828 Presence of other vascular implants and grafts: Secondary | ICD-10-CM

## 2015-12-07 MED ORDER — SODIUM CHLORIDE 0.9 % IJ SOLN
10.0000 mL | INTRAMUSCULAR | Status: DC | PRN
  Administered 2015-12-07: 10 mL via INTRAVENOUS
  Filled 2015-12-07: qty 10

## 2015-12-07 MED ORDER — HEPARIN SOD (PORK) LOCK FLUSH 100 UNIT/ML IV SOLN
500.0000 [IU] | Freq: Once | INTRAVENOUS | Status: AC | PRN
  Administered 2015-12-07: 500 [IU] via INTRAVENOUS
  Filled 2015-12-07: qty 5

## 2015-12-07 NOTE — Patient Instructions (Signed)

## 2015-12-30 ENCOUNTER — Emergency Department (HOSPITAL_COMMUNITY)
Admission: EM | Admit: 2015-12-30 | Discharge: 2015-12-30 | Disposition: A | Discharge status: Home / Self Care | Payer: Medicare Other | Attending: Emergency Medicine | Admitting: Emergency Medicine

## 2015-12-30 ENCOUNTER — Emergency Department (HOSPITAL_COMMUNITY): Payer: Medicare Other

## 2015-12-30 ENCOUNTER — Encounter (HOSPITAL_COMMUNITY): Payer: Self-pay | Admitting: *Deleted

## 2015-12-30 DIAGNOSIS — Y939 Activity, unspecified: Secondary | ICD-10-CM | POA: Diagnosis not present

## 2015-12-30 DIAGNOSIS — Y999 Unspecified external cause status: Secondary | ICD-10-CM | POA: Insufficient documentation

## 2015-12-30 DIAGNOSIS — Y92007 Garden or yard of unspecified non-institutional (private) residence as the place of occurrence of the external cause: Secondary | ICD-10-CM | POA: Insufficient documentation

## 2015-12-30 DIAGNOSIS — Z85118 Personal history of other malignant neoplasm of bronchus and lung: Secondary | ICD-10-CM | POA: Diagnosis not present

## 2015-12-30 DIAGNOSIS — S6992XA Unspecified injury of left wrist, hand and finger(s), initial encounter: Secondary | ICD-10-CM | POA: Diagnosis present

## 2015-12-30 DIAGNOSIS — W0110XA Fall on same level from slipping, tripping and stumbling with subsequent striking against unspecified object, initial encounter: Secondary | ICD-10-CM | POA: Diagnosis not present

## 2015-12-30 DIAGNOSIS — Z79899 Other long term (current) drug therapy: Secondary | ICD-10-CM | POA: Insufficient documentation

## 2015-12-30 DIAGNOSIS — S60822A Blister (nonthermal) of left wrist, initial encounter: Secondary | ICD-10-CM | POA: Diagnosis not present

## 2015-12-30 DIAGNOSIS — T148XXA Other injury of unspecified body region, initial encounter: Secondary | ICD-10-CM

## 2015-12-30 DIAGNOSIS — Z87891 Personal history of nicotine dependence: Secondary | ICD-10-CM | POA: Diagnosis not present

## 2015-12-30 DIAGNOSIS — Z7982 Long term (current) use of aspirin: Secondary | ICD-10-CM | POA: Insufficient documentation

## 2015-12-30 DIAGNOSIS — M25532 Pain in left wrist: Secondary | ICD-10-CM | POA: Diagnosis not present

## 2015-12-30 DIAGNOSIS — I10 Essential (primary) hypertension: Secondary | ICD-10-CM | POA: Diagnosis not present

## 2015-12-30 NOTE — ED Provider Notes (Signed)
[facility] DEPT Provider Note   CSN: [id number] Arrival date & time: 12/30/15  1436  First Provider Contact:  First MD Initiated Contact with Patient 12/30/15 [phone]        History   Chief Complaint Chief Complaint  Patient presents with  . Wrist Injury    HPI Patrick Carr is a 82 y.o. male.  Clinical-year-old male presents after a fall 2 days ago where he sustained a contusion to his left wrist. No loss of consciousness during the event, no difficulty ambulating or using the hand since. He has not tried anything for the injury and has had limited pain which has improved without intervention.   The history is provided by the patient.    Past Medical History:  Diagnosis Date  . BPH (benign prostatic hyperplasia)   . Cancer ([place])    Lung  . Hernia, inguinal left   . History of blood transfusion    artery in sinus bleed  . History of kidney stones   . Hyperlipidemia   . Hypertension   . Irregular heart rate   . Lesion of right lung    RLL  . Phlebitis 03/20/14   bilateral forearms  . Radiation 02/08/14-03/21/14   Stage III lung cancer 30 fractions  . RBBB    no palpations  . Renal cysts, acquired, bilateral     Patient Active Problem List   Diagnosis Date Noted  . Port catheter in place 10/12/2015  . Phlebitis 03/16/2014  . Small cell carcinoma of right lung ([place]) 01/26/2014  . Hypertension   . Hyperlipidemia   . BPH (benign prostatic hyperplasia)   . RBBB   . Renal cysts, acquired, bilateral   . Lesion of right lung   . Lung nodule 01/03/2014    Past Surgical History:  Procedure Laterality Date  . NO PAST SURGERIES    . Sinus artery surgery     not surgery  . VIDEO BRONCHOSCOPY N/A 07/04/2014   Procedure: VIDEO BRONCHOSCOPY WITH  ENDOBRONCIAL BIOPSY;  Surgeon: [name], MD;  Location: [facility];  Service: Thoracic;  Laterality: N/A;  . VIDEO BRONCHOSCOPY WITH ENDOBRONCHIAL NAVIGATION N/A 01/18/2014   Procedure: VIDEO BRONCHOSCOPY WITH  ENDOBRONCHIAL NAVIGATION;  Surgeon: [name], MD;  Location: [facility];  Service: Thoracic;  Laterality: N/A;  . VIDEO BRONCHOSCOPY WITH ENDOBRONCHIAL ULTRASOUND N/A 01/18/2014   Procedure: VIDEO BRONCHOSCOPY WITH ENDOBRONCHIAL ULTRASOUND;  Surgeon: [name], MD;  Location: [facility];  Service: Thoracic;  Laterality: N/A;       Home Medications    Prior to Admission medications   Medication Sig Start Date End Date Taking? Authorizing Provider  alfuzosin (UROXATRAL) 10 MG 24 hr tablet Take 10 mg by mouth at bedtime.     Historical Provider, MD  ALPRAZolam Duanne Moron) 0.25 MG tablet Take 1 tablet (0.25 mg total) by mouth at bedtime as needed for anxiety. 08/23/15   [name], MD  amLODipine (NORVASC) 5 MG tablet Take 5 mg by mouth at bedtime.     Historical Provider, MD  aspirin 81 MG tablet Take 81 mg by mouth at bedtime.     Historical Provider, MD  atorvastatin (LIPITOR) 40 MG tablet Take 40 mg by mouth at bedtime.     Historical Provider, MD  diphenhydrAMINE (BENADRYL) 25 MG tablet Take 1 tablet (25 mg total) by mouth every 6 (six) hours as needed for itching (Rash). Patient not taking: Reported on 11/21/2014 08/11/14   Noemi Chapel, MD  famotidine (PEPCID)  20 MG tablet Take 1 tablet (20 mg total) by mouth 2 (two) times daily. 08/11/14   Noemi Chapel, MD  lidocaine-prilocaine (EMLA) cream Apply 1 application topically as needed. Apply over port site 1 hour prior to chemo. Do not rub in medicine. 08/23/15   [name], MD  tamsulosin (FLOMAX) 0.4 MG CAPS capsule Take 0.4 mg by mouth at bedtime.     Historical Provider, MD    Family History Family History  Problem Relation Age of Onset  . Heart disease Mother   . Cancer Father     Social History Social History  Substance Use Topics  . Smoking status: Former Smoker    Packs/day: 0.50    Years: 62.00    Quit date: 01/29/2014  . Smokeless tobacco: Never Used  . Alcohol use No     Allergies   Contrast media [iodinated  diagnostic agents]; Other; and Pneumococcal vaccines   Review of Systems Review of Systems   Physical Exam Updated Vital Signs BP 148/84   Pulse 68   Temp 97.6 F (36.4 C) (Oral)   Resp 18   SpO2 95%   Physical Exam  Constitutional: He is oriented to person, place, and time. He appears well-developed and well-nourished. No distress.  HENT:  Head: Normocephalic and atraumatic.  Eyes: Conjunctivae are normal.  Neck: Neck supple. No tracheal deviation present.  Cardiovascular: Normal rate and regular rhythm.   Pulmonary/Chest: Effort normal. No respiratory distress.  Abdominal: Soft. He exhibits no distension.  Musculoskeletal:       Left wrist: He exhibits swelling (with 1.5 cm blood-filled bulla overlying the base of the palm, spreading ecchymosis).  Neurological: He is alert and oriented to person, place, and time.  Skin: Skin is warm and dry.  Psychiatric: He has a normal mood and affect.     ED Treatments / Results  Labs (all labs ordered are listed, but only abnormal results are displayed) Labs Reviewed - No data to display  EKG  EKG Interpretation None       Radiology Dg Wrist Complete Left  Result Date: 12/30/2015 CLINICAL DATA:  82 year old male with history of trauma after falling over a tree root in the yard 2 days ago onto the left wrist complaining of pain in the wrist since that time. EXAM: LEFT WRIST - COMPLETE 3+ VIEW COMPARISON:  No priors. FINDINGS: Multiple views of the left wrist demonstrate no acute displaced fracture, subluxation or dislocation. Soft tissue prominence along the volar aspect of the wrist is of uncertain etiology and significance. IMPRESSION: No acute radiographic abnormality of the bones of the left wrist. Electronically Signed   By: Vinnie Langton M.D.   On: 12/30/2015 15:10    Procedures Procedures (including critical care time)  Medications Ordered in ED Medications - No data to display   Initial Impression / Assessment  and Plan / ED Course  I have reviewed the triage vital signs and the nursing notes.  Pertinent labs & imaging results that were available during my care of the patient were reviewed by me and considered in my medical decision making (see chart for details).  Clinical Course    82 y.o. male presents with A blood blister over his left wrist after sustaining a fall. No bony injury is noted on x-ray. I recommended that he not rupture it artificially to maintain the natural barrier to infection and to keep it clean if it does spontaneously rupture. He can check in with his primary care physician as  needed for a recheck of the area following supportive care if it bothers him.  Final Clinical Impressions(s) / ED Diagnoses   Final diagnoses:  Blood blister    New Prescriptions New Prescriptions   No medications on file     Leo Grosser, MD 12/30/15 [phone]

## 2015-12-30 NOTE — ED Triage Notes (Signed)
Pt states he tripped over a tree root 2 days ago, falling onto his left side, hitting his arm, leg and head. Pt has a large blood blister on his left wrist, which he states has gotten bigger since the fall.

## 2016-01-23 DIAGNOSIS — Z0000 Encounter for general adult medical examination without abnormal findings: Secondary | ICD-10-CM | POA: Diagnosis not present

## 2016-01-23 DIAGNOSIS — H6123 Impacted cerumen, bilateral: Secondary | ICD-10-CM | POA: Diagnosis not present

## 2016-01-23 DIAGNOSIS — Z125 Encounter for screening for malignant neoplasm of prostate: Secondary | ICD-10-CM | POA: Diagnosis not present

## 2016-01-23 DIAGNOSIS — Z23 Encounter for immunization: Secondary | ICD-10-CM | POA: Diagnosis not present

## 2016-01-23 DIAGNOSIS — E7800 Pure hypercholesterolemia, unspecified: Secondary | ICD-10-CM | POA: Diagnosis not present

## 2016-01-23 DIAGNOSIS — I10 Essential (primary) hypertension: Secondary | ICD-10-CM | POA: Diagnosis not present

## 2016-01-25 ENCOUNTER — Ambulatory Visit (HOSPITAL_BASED_OUTPATIENT_CLINIC_OR_DEPARTMENT_OTHER): Payer: Medicare Other

## 2016-01-25 ENCOUNTER — Other Ambulatory Visit (HOSPITAL_BASED_OUTPATIENT_CLINIC_OR_DEPARTMENT_OTHER): Payer: Medicare Other

## 2016-01-25 DIAGNOSIS — C3491 Malignant neoplasm of unspecified part of right bronchus or lung: Secondary | ICD-10-CM | POA: Diagnosis not present

## 2016-01-25 DIAGNOSIS — I10 Essential (primary) hypertension: Secondary | ICD-10-CM | POA: Diagnosis not present

## 2016-01-25 DIAGNOSIS — Z95828 Presence of other vascular implants and grafts: Secondary | ICD-10-CM

## 2016-01-25 DIAGNOSIS — E7800 Pure hypercholesterolemia, unspecified: Secondary | ICD-10-CM | POA: Diagnosis not present

## 2016-01-25 DIAGNOSIS — Z125 Encounter for screening for malignant neoplasm of prostate: Secondary | ICD-10-CM | POA: Diagnosis not present

## 2016-01-25 LAB — COMPREHENSIVE METABOLIC PANEL
ALBUMIN: 3.7 g/dL (ref 3.5–5.0)
ALT: 13 U/L (ref 0–55)
AST: 22 U/L (ref 5–34)
Alkaline Phosphatase: 90 U/L (ref 40–150)
Anion Gap: 11 mEq/L (ref 3–11)
BUN: 16.9 mg/dL (ref 7.0–26.0)
CHLORIDE: 100 meq/L (ref 98–109)
CO2: 26 mEq/L (ref 22–29)
CREATININE: 1 mg/dL (ref 0.7–1.3)
Calcium: 9.5 mg/dL (ref 8.4–10.4)
EGFR: 69 mL/min/{1.73_m2} — ABNORMAL LOW (ref >90)
GLUCOSE: 113 mg/dL (ref 70–140)
POTASSIUM: 4.2 meq/L (ref 3.5–5.1)
SODIUM: 136 meq/L (ref 136–145)
Total Bilirubin: 0.96 mg/dL (ref 0.20–1.20)
Total Protein: 6.8 g/dL (ref 6.4–8.3)

## 2016-01-25 LAB — CBC WITH DIFFERENTIAL/PLATELET
BASO%: 0.2 % (ref 0.0–2.0)
BASOS ABS: 0 10*3/uL (ref 0.0–0.1)
EOS%: 1.2 % (ref 0.0–7.0)
Eosinophils Absolute: 0.1 10*3/uL (ref 0.0–0.5)
HEMATOCRIT: 37.7 % — AB (ref 38.4–49.9)
HEMOGLOBIN: 12.8 g/dL — AB (ref 13.0–17.1)
LYMPH#: 0.4 10*3/uL — AB (ref 0.9–3.3)
LYMPH%: 7.5 % — ABNORMAL LOW (ref 14.0–49.0)
MCH: 32.2 pg (ref 27.2–33.4)
MCHC: 34 g/dL (ref 32.0–36.0)
MCV: 95 fL (ref 79.3–98.0)
MONO#: 0.6 10*3/uL (ref 0.1–0.9)
MONO%: 10.8 % (ref 0.0–14.0)
NEUT#: 4.1 10*3/uL (ref 1.5–6.5)
NEUT%: 80.3 % — ABNORMAL HIGH (ref 39.0–75.0)
Platelets: 135 10*3/uL — ABNORMAL LOW (ref 140–400)
RBC: 3.97 10*6/uL — ABNORMAL LOW (ref 4.20–5.82)
RDW: 13.5 % (ref 11.0–14.6)
WBC: 5.1 10*3/uL (ref 4.0–10.3)

## 2016-01-25 MED ORDER — HEPARIN SOD (PORK) LOCK FLUSH 100 UNIT/ML IV SOLN
500.0000 [IU] | Freq: Once | INTRAVENOUS | Status: AC | PRN
  Administered 2016-01-25: 500 [IU] via INTRAVENOUS
  Filled 2016-01-25: qty 5

## 2016-01-25 MED ORDER — SODIUM CHLORIDE 0.9 % IJ SOLN
10.0000 mL | INTRAMUSCULAR | Status: DC | PRN
  Administered 2016-01-25: 10 mL via INTRAVENOUS
  Filled 2016-01-25: qty 10

## 2016-01-25 NOTE — Patient Instructions (Signed)

## 2016-01-29 ENCOUNTER — Other Ambulatory Visit: Payer: Medicare Other

## 2016-01-30 DIAGNOSIS — I10 Essential (primary) hypertension: Secondary | ICD-10-CM | POA: Diagnosis not present

## 2016-01-30 DIAGNOSIS — R3129 Other microscopic hematuria: Secondary | ICD-10-CM | POA: Diagnosis not present

## 2016-01-30 DIAGNOSIS — R7309 Other abnormal glucose: Secondary | ICD-10-CM | POA: Diagnosis not present

## 2016-01-30 DIAGNOSIS — E7800 Pure hypercholesterolemia, unspecified: Secondary | ICD-10-CM | POA: Diagnosis not present

## 2016-02-01 ENCOUNTER — Other Ambulatory Visit: Payer: Medicare Other

## 2016-02-01 ENCOUNTER — Other Ambulatory Visit: Payer: Self-pay | Admitting: Medical Oncology

## 2016-02-01 ENCOUNTER — Telehealth: Payer: Self-pay | Admitting: *Deleted

## 2016-02-01 DIAGNOSIS — C3490 Malignant neoplasm of unspecified part of unspecified bronchus or lung: Secondary | ICD-10-CM

## 2016-02-01 NOTE — Telephone Encounter (Signed)
Ct order changed

## 2016-02-01 NOTE — Telephone Encounter (Signed)
TC from central scheduling regarding up coming chest CT. Order is for Chest CT with contrast but patient is allergic to contrast dye. Previous CT scans have been done w/o contrast. Scheduling would need the order changed to do scan w/o contrast. Please clarify with scheduling at [phone]

## 2016-02-04 ENCOUNTER — Ambulatory Visit (HOSPITAL_COMMUNITY)
Admission: RE | Admit: 2016-02-04 | Discharge: 2016-02-04 | Disposition: A | Discharge status: Home / Self Care | Payer: Medicare Other | Source: Ambulatory Visit | Attending: Internal Medicine | Admitting: Internal Medicine

## 2016-02-04 ENCOUNTER — Encounter (HOSPITAL_COMMUNITY): Payer: Self-pay

## 2016-02-04 DIAGNOSIS — C3490 Malignant neoplasm of unspecified part of unspecified bronchus or lung: Secondary | ICD-10-CM | POA: Insufficient documentation

## 2016-02-04 DIAGNOSIS — R911 Solitary pulmonary nodule: Secondary | ICD-10-CM | POA: Diagnosis not present

## 2016-02-04 DIAGNOSIS — J432 Centrilobular emphysema: Secondary | ICD-10-CM | POA: Diagnosis not present

## 2016-02-05 ENCOUNTER — Encounter: Payer: Self-pay | Admitting: Internal Medicine

## 2016-02-05 ENCOUNTER — Telehealth: Payer: Self-pay | Admitting: Internal Medicine

## 2016-02-05 ENCOUNTER — Ambulatory Visit (HOSPITAL_BASED_OUTPATIENT_CLINIC_OR_DEPARTMENT_OTHER): Payer: Medicare Other | Admitting: Internal Medicine

## 2016-02-05 VITALS — BP 116/66 | HR 97 | Temp 97.6°F | Resp 18 | Ht 65.0 in | Wt 122.7 lb

## 2016-02-05 DIAGNOSIS — Z85118 Personal history of other malignant neoplasm of bronchus and lung: Secondary | ICD-10-CM | POA: Diagnosis not present

## 2016-02-05 DIAGNOSIS — Z9221 Personal history of antineoplastic chemotherapy: Secondary | ICD-10-CM | POA: Diagnosis not present

## 2016-02-05 DIAGNOSIS — C3491 Malignant neoplasm of unspecified part of right bronchus or lung: Secondary | ICD-10-CM

## 2016-02-05 NOTE — Telephone Encounter (Signed)
Avs report and appointment schd given per 02/05/16 los. °

## 2016-02-05 NOTE — Progress Notes (Signed)
[name] Telephone:([phone]) [phone]   Fax:(336) [phone]  OFFICE PROGRESS NOTE  [name], MD [address] [place] [address]  DIAGNOSIS: Limited stage small cell lung cancer, stage IIA (T1a, N1, M0) presented with right lower lobe nodule in addition to right hilar lymphadenopathy diagnosed in August of 2015.  PRIOR THERAPY: Systemic chemotherapy with cisplatin 60 mg/M2 on day 1 and etoposide at 120 mg/M2 on days 1, 2 and 3 with Neulasta support on day 4. Status post 6 cycles, last dose was giving 05/23/2014 with partial response. He is not interested in prophylactic cranial irradiation after discussion with Dr. [name].  CURRENT THERAPY: Observation.  INTERVAL HISTORY: Patrick Carr 82 y.o. male returns to the clinic today for followup visit accompanied by his wife. The patient has no complaints today. He denied having any fever or chills. He has no nausea or vomiting. He denied having any significant chest pain, shortness of breath, cough or hemoptysis. He has no significant weight loss or night sweats. He had repeat CT scan of the chest performed recently and he is here for evaluation and discussion of his scan results.  MEDICAL HISTORY: Past Medical History:  Diagnosis Date  . BPH (benign prostatic hyperplasia)   . Cancer ([place])    Lung  . Hernia, inguinal left   . History of blood transfusion    artery in sinus bleed  . History of kidney stones   . Hyperlipidemia   . Hypertension   . Irregular heart rate   . Lesion of right lung    RLL  . Phlebitis 03/20/14   bilateral forearms  . Radiation 02/08/14-03/21/14   Stage III lung cancer 30 fractions  . RBBB    no palpations  . Renal cysts, acquired, bilateral     ALLERGIES:  is allergic to contrast media [iodinated diagnostic agents]; other; and pneumococcal vaccines.  MEDICATIONS:  Current Outpatient Prescriptions  Medication Sig Dispense Refill  . alfuzosin  (UROXATRAL) 10 MG 24 hr tablet Take 10 mg by mouth at bedtime.     . ALPRAZolam (XANAX) 0.25 MG tablet Take 1 tablet (0.25 mg total) by mouth at bedtime as needed for anxiety. 30 tablet 0  . amLODipine (NORVASC) 5 MG tablet Take 5 mg by mouth at bedtime.     [name] aspirin 81 MG tablet Take 81 mg by mouth at bedtime.     [name] atorvastatin (LIPITOR) 40 MG tablet Take 40 mg by mouth at bedtime.     . diphenhydrAMINE (BENADRYL) 25 MG tablet Take 1 tablet (25 mg total) by mouth every 6 (six) hours as needed for itching (Rash). 30 tablet 0  . famotidine (PEPCID) 20 MG tablet Take 1 tablet (20 mg total) by mouth 2 (two) times daily. 30 tablet 0  . lidocaine-prilocaine (EMLA) cream Apply 1 application topically as needed. Apply over port site 1 hour prior to chemo. Do not rub in medicine. 30 g 0  . losartan (COZAAR) 100 MG tablet     . tamsulosin (FLOMAX) 0.4 MG CAPS capsule Take 0.4 mg by mouth at bedtime.      No current facility-administered medications for this visit.    Facility-Administered Medications Ordered in Other Visits  Medication Dose Route Frequency Provider Last Rate Last Dose  . sodium chloride 0.9 % injection 10 mL  10 mL Intravenous PRN [name], MD   10 mL at 05/23/14 1635  . sodium chloride 0.9 % injection 10 mL  10 mL Intravenous PRN [name], MD   10 mL at 05/18/15 1255    SURGICAL HISTORY:  Past Surgical History:  Procedure Laterality Date  . NO PAST SURGERIES    . Sinus artery surgery     not surgery  . VIDEO BRONCHOSCOPY N/A 07/04/2014   Procedure: VIDEO BRONCHOSCOPY WITH  ENDOBRONCIAL BIOPSY;  Surgeon: [name], MD;  Location: [facility];  Service: Thoracic;  Laterality: N/A;  . VIDEO BRONCHOSCOPY WITH ENDOBRONCHIAL NAVIGATION N/A 01/18/2014   Procedure: VIDEO BRONCHOSCOPY WITH ENDOBRONCHIAL NAVIGATION;  Surgeon: [name], MD;  Location: [facility];  Service: Thoracic;  Laterality: N/A;  . VIDEO BRONCHOSCOPY WITH ENDOBRONCHIAL ULTRASOUND N/A 01/18/2014    Procedure: VIDEO BRONCHOSCOPY WITH ENDOBRONCHIAL ULTRASOUND;  Surgeon: [name], MD;  Location[facility];  Service: Thoracic;  Laterality: N/A;    REVIEW OF SYSTEMS:  A comprehensive review of systems was negative.   PHYSICAL EXAMINATION: General appearance: alert, cooperative and no distress Head: Normocephalic, without obvious abnormality, atraumatic Neck: no adenopathy, no JVD, supple, symmetrical, trachea midline and thyroid not enlarged, symmetric, no tenderness/mass/nodules Lymph nodes: Cervical, supraclavicular, and axillary nodes normal. Resp: clear to auscultation bilaterally Back: symmetric, no curvature. ROM normal. No CVA tenderness. Cardio: regular rate and rhythm, S1, S2 normal, no murmur, click, rub or gallop GI: soft, non-tender; bowel sounds normal; no masses,  no organomegaly Extremities: extremities normal, atraumatic, no cyanosis or edema  ECOG PERFORMANCE STATUS: 1 - Symptomatic but completely ambulatory  Blood pressure 116/66, pulse 97, temperature 97.6 F (36.4 C), temperature source Oral, resp. rate 18, height '5\' 5"'$  (1.651 m), weight 122 lb 11.2 oz (55.7 kg), SpO2 96 %.  LABORATORY DATA: Lab Results  Component Value Date   WBC 5.1 01/25/2016   HGB 12.8 (L) 01/25/2016   HCT 37.7 (L) 01/25/2016   MCV 95.0 01/25/2016   PLT 135 (L) 01/25/2016      Chemistry      Component Value Date/Time   NA 136 01/25/2016 0930   K 4.2 01/25/2016 0930   CL 103 05/18/2015 1048   CO2 26 01/25/2016 0930   BUN 16.9 01/25/2016 0930   CREATININE 1.0 01/25/2016 0930      Component Value Date/Time   CALCIUM 9.5 01/25/2016 0930   ALKPHOS 90 01/25/2016 0930   AST 22 01/25/2016 0930   ALT 13 01/25/2016 0930   BILITOT 0.96 01/25/2016 0930       RADIOGRAPHIC STUDIES: Ct Chest Wo Contrast  Result Date: 02/05/2016 CLINICAL DATA:  Restaging lung cancer. Diagnosis August 15th. Chemotherapy and radiation therapy complete. Small cell lung cancer. MD specified w/o IV dye  Restaging rt lung ca dx 8/15;chemo and XRT complete;no complaints; Limited stage small cell lung cancer, stage IIA (T1a, N1, M0) presented with right lower lobe nodule in addition to right hilar lymphadenopathy EXAM: CT CHEST WITHOUT CONTRAST TECHNIQUE: Multidetector CT imaging of the chest was performed following the standard protocol without IV contrast. COMPARISON:  08/17/2015 FINDINGS: Cardiovascular: Coronary artery calcification and aortic atherosclerotic calcification. Mediastinum/Nodes: No axillary or supraclavicular lymphadenopathy. No mediastinal hilar lymphadenopathy. Gas and small 1 of fluid in the esophagus. No high-grade obstruction evident Lungs/Pleura: In the RIGHT upper lobe, apical pleural parenchymal thickening with central calcification measures 22 by 14 mm compared to 22 x 13 mm for no interval change. RIGHT lower lobe nodule measures 8 mm by 5 mm compared to 9 mm x 6 mm (image 65, series 5). RIGHT lower lobe radiation change similar to comparison exam. No  new nodularity. Centrilobular emphysema in the upper lobes. Upper Abdomen: Low-density lesions in the kidneys simple fluid attenuation. Adrenal glands are normal. Musculoskeletal: No aggressive osseous lesion. IMPRESSION: 1. Stable RIGHT lower lobe nodule and radiation change in the RIGHT lower lobe. 2. Stable pleural-parenchymal nodular thickeningat the RIGHT lung apex. 3. Centrilobular emphysema. Electronically Signed   By: Suzy Bouchard M.D.   On: 02/05/2016 09:16    ASSESSMENT AND PLAN: This is a very pleasant 82 years old white male recently diagnosed with limited stage small cell lung cancer and currently undergoing systemic chemotherapy with cisplatin and etoposide status post 6 cycle. He tolerated his treatment fairly well with no significant adverse effects. He has been observation since December 2015.  The recent CT scan of the chest showed no evidence for disease recurrence. I discussed the scan results with the patient  today. I recommended for him to continue on observation with repeat CT scan of the chest in 6 months. He requested removal of the Port-A-Cath and I will send him to interventional radiology for this procedure. He was advised to call immediately if he has any concerning symptoms in the interval. The patient voices understanding of current disease status and treatment options and is in agreement with the current care plan.  All questions were answered. The patient knows to call the clinic with any problems, questions or concerns. We can certainly see the patient much sooner if necessary.  Disclaimer: This note was dictated with voice recognition software. Similar sounding words can inadvertently be transcribed and may not be corrected upon review.

## 2016-02-06 ENCOUNTER — Ambulatory Visit: Payer: Medicare Other | Admitting: Internal Medicine

## 2016-02-08 ENCOUNTER — Other Ambulatory Visit: Payer: Self-pay | Admitting: Radiology

## 2016-02-13 ENCOUNTER — Ambulatory Visit (HOSPITAL_COMMUNITY)
Admission: RE | Admit: 2016-02-13 | Discharge: 2016-02-13 | Disposition: A | Discharge status: Home / Self Care | Payer: Medicare Other | Source: Ambulatory Visit | Attending: Internal Medicine | Admitting: Internal Medicine

## 2016-02-13 ENCOUNTER — Encounter (HOSPITAL_COMMUNITY): Payer: Self-pay

## 2016-02-13 DIAGNOSIS — E785 Hyperlipidemia, unspecified: Secondary | ICD-10-CM | POA: Diagnosis not present

## 2016-02-13 DIAGNOSIS — Z91041 Radiographic dye allergy status: Secondary | ICD-10-CM | POA: Diagnosis not present

## 2016-02-13 DIAGNOSIS — C3491 Malignant neoplasm of unspecified part of right bronchus or lung: Secondary | ICD-10-CM

## 2016-02-13 DIAGNOSIS — Z85118 Personal history of other malignant neoplasm of bronchus and lung: Secondary | ICD-10-CM | POA: Diagnosis not present

## 2016-02-13 DIAGNOSIS — I4510 Unspecified right bundle-branch block: Secondary | ICD-10-CM | POA: Insufficient documentation

## 2016-02-13 DIAGNOSIS — N281 Cyst of kidney, acquired: Secondary | ICD-10-CM | POA: Diagnosis not present

## 2016-02-13 DIAGNOSIS — Z7982 Long term (current) use of aspirin: Secondary | ICD-10-CM | POA: Insufficient documentation

## 2016-02-13 DIAGNOSIS — I10 Essential (primary) hypertension: Secondary | ICD-10-CM | POA: Insufficient documentation

## 2016-02-13 DIAGNOSIS — Z87442 Personal history of urinary calculi: Secondary | ICD-10-CM | POA: Diagnosis not present

## 2016-02-13 DIAGNOSIS — N400 Enlarged prostate without lower urinary tract symptoms: Secondary | ICD-10-CM | POA: Diagnosis not present

## 2016-02-13 DIAGNOSIS — Z452 Encounter for adjustment and management of vascular access device: Secondary | ICD-10-CM | POA: Diagnosis not present

## 2016-02-13 DIAGNOSIS — Z9221 Personal history of antineoplastic chemotherapy: Secondary | ICD-10-CM | POA: Diagnosis not present

## 2016-02-13 DIAGNOSIS — Z5111 Encounter for antineoplastic chemotherapy: Secondary | ICD-10-CM | POA: Diagnosis not present

## 2016-02-13 HISTORY — PX: IR GENERIC HISTORICAL: IMG1180011

## 2016-02-13 LAB — CBC WITH DIFFERENTIAL/PLATELET
Basophils Absolute: 0 10*3/uL (ref 0.0–0.1)
Basophils Relative: 0 %
EOS ABS: 0.1 10*3/uL (ref 0.0–0.7)
EOS PCT: 1 %
HCT: 40.7 % (ref 39.0–52.0)
Hemoglobin: 13.6 g/dL (ref 13.0–17.0)
LYMPHS PCT: 10 %
Lymphs Abs: 0.5 10*3/uL — ABNORMAL LOW (ref 0.7–4.0)
MCH: 32.3 pg (ref 26.0–34.0)
MCHC: 33.4 g/dL (ref 30.0–36.0)
MCV: 96.7 fL (ref 78.0–100.0)
MONOS PCT: 10 %
Monocytes Absolute: 0.5 10*3/uL (ref 0.1–1.0)
Neutro Abs: 4.4 10*3/uL (ref 1.7–7.7)
Neutrophils Relative %: 79 %
PLATELETS: 144 10*3/uL — AB (ref 150–400)
RBC: 4.21 MIL/uL — AB (ref 4.22–5.81)
RDW: 13.5 % (ref 11.5–15.5)
WBC: 5.6 10*3/uL (ref 4.0–10.5)

## 2016-02-13 LAB — PROTIME-INR
INR: 1.01
PROTHROMBIN TIME: 13.3 s (ref 11.4–15.2)

## 2016-02-13 MED ORDER — LIDOCAINE-EPINEPHRINE (PF) 2 %-1:200000 IJ SOLN
INTRAMUSCULAR | Status: AC | PRN
  Administered 2016-02-13: 10 mL

## 2016-02-13 MED ORDER — SODIUM CHLORIDE 0.9 % IV SOLN
INTRAVENOUS | Status: DC
  Administered 2016-02-13: 12:00:00 via INTRAVENOUS

## 2016-02-13 MED ORDER — MIDAZOLAM HCL 2 MG/2ML IJ SOLN
INTRAMUSCULAR | Status: AC | PRN
  Administered 2016-02-13 (×3): 0.5 mg via INTRAVENOUS

## 2016-02-13 MED ORDER — CEFAZOLIN SODIUM-DEXTROSE 2-4 GM/100ML-% IV SOLN
2.0000 g | INTRAVENOUS | Status: AC
  Administered 2016-02-13: 2 g via INTRAVENOUS
  Filled 2016-02-13: qty 100

## 2016-02-13 MED ORDER — MIDAZOLAM HCL 2 MG/2ML IJ SOLN
INTRAMUSCULAR | Status: AC
  Filled 2016-02-13: qty 2

## 2016-02-13 MED ORDER — LIDOCAINE-EPINEPHRINE (PF) 2 %-1:200000 IJ SOLN
INTRAMUSCULAR | Status: AC
  Filled 2016-02-13: qty 20

## 2016-02-13 MED ORDER — NALOXONE HCL 0.4 MG/ML IJ SOLN
INTRAMUSCULAR | Status: AC
  Filled 2016-02-13: qty 1

## 2016-02-13 MED ORDER — FLUMAZENIL 0.5 MG/5ML IV SOLN
INTRAVENOUS | Status: AC
  Filled 2016-02-13: qty 5

## 2016-02-13 MED ORDER — FENTANYL CITRATE (PF) 100 MCG/2ML IJ SOLN
INTRAMUSCULAR | Status: AC
  Filled 2016-02-13: qty 2

## 2016-02-13 MED ORDER — FENTANYL CITRATE (PF) 100 MCG/2ML IJ SOLN
INTRAMUSCULAR | Status: AC | PRN
  Administered 2016-02-13 (×3): 25 ug via INTRAVENOUS

## 2016-02-13 NOTE — Discharge Instructions (Signed)
Moderate Conscious Sedation, Adult, Care After Refer to this sheet in the next few weeks. These instructions provide you with information on caring for yourself after your procedure. Your health care provider may also give you more specific instructions. Your treatment has been planned according to current medical practices, but problems sometimes occur. Call your health care provider if you have any problems or questions after your procedure. WHAT TO EXPECT AFTER THE PROCEDURE  After your procedure:  You may feel sleepy, clumsy, and have poor balance for several hours.  Vomiting may occur if you eat too soon after the procedure. HOME CARE INSTRUCTIONS  Do not participate in any activities where you could become injured for at least 24 hours. Do not:  Drive.  Swim.  Ride a bicycle.  Operate heavy machinery.  Cook.  Use power tools.  Climb ladders.  Work from a high place.  Do not make important decisions or sign legal documents until you are improved.  If you vomit, drink water, juice, or soup when you can drink without vomiting. Make sure you have little or no nausea before eating solid foods.  Only take over-the-counter or prescription medicines for pain, discomfort, or fever as directed by your health care provider.  Make sure you and your family fully understand everything about the medicines given to you, including what side effects may occur.  You should not drink alcohol, take sleeping pills, or take medicines that cause drowsiness for at least 24 hours.  If you smoke, do not smoke without supervision.  If you are feeling better, you may resume normal activities 24 hours after you were sedated.  Keep all appointments with your health care provider. SEEK MEDICAL CARE IF:  Your skin is pale or bluish in color.  You continue to feel nauseous or vomit.  Your pain is getting worse and is not helped by medicine.  You have bleeding or swelling.  You are still  sleepy or feeling clumsy after 24 hours. SEEK IMMEDIATE MEDICAL CARE IF:  You develop a rash.  You have difficulty breathing.  You develop any type of allergic problem.  You have a fever. MAKE SURE YOU:  Understand these instructions.  Will watch your condition.  Will get help right away if you are not doing well or get worse.   This information is not intended to replace advice given to you by your health care provider. Make sure you discuss any questions you have with your health care provider.   Document Released: 03/02/2013 Document Revised: 06/02/2014 Document Reviewed: 03/02/2013 Elsevier Interactive Patient Education 2016 [place] dressing after 1400 tomorrow- 02/14/16, shower at that time.

## 2016-02-13 NOTE — Procedures (Signed)
S/p RT IJ PORT REMOVAL  No comp Stable Full report in PACS

## 2016-02-13 NOTE — Progress Notes (Signed)
Referring Physician(s): Mohamed,Mohamed  Supervising Physician: Daryll Brod  Patient Status:  Outpatient  Chief Complaint:  "I'm getting my port out"  Subjective: Patient with history of limited stage small cell lung cancer and prior right chest wall Port-A-Cath via IR service on 04/10/14. He has undergone chemoradiation and been on observation since December 2015. Recent CT scan of the chest shows no evidence for disease recurrence. He presents again today for Port-A-Cath removal. He currently denies fever, headache, chest pain, worsening dyspnea, cough, abdominal/back pain, nausea, vomiting or abnormal bleeding.  Past Medical History:  Diagnosis Date  . BPH (benign prostatic hyperplasia)   . Cancer ([place])    Lung  . Hernia, inguinal left   . History of blood transfusion    artery in sinus bleed  . History of kidney stones   . Hyperlipidemia   . Hypertension   . Irregular heart rate   . Lesion of right lung    RLL  . Phlebitis 03/20/14   bilateral forearms  . Radiation 02/08/14-03/21/14   Stage III lung cancer 30 fractions  . RBBB    no palpations  . Renal cysts, acquired, bilateral    Past Surgical History:  Procedure Laterality Date  . NO PAST SURGERIES    . Sinus artery surgery     not surgery  . VIDEO BRONCHOSCOPY N/A 07/04/2014   Procedure: VIDEO BRONCHOSCOPY WITH  ENDOBRONCIAL BIOPSY;  Surgeon: [name], MD;  Location: [facility];  Service: Thoracic;  Laterality: N/A;  . VIDEO BRONCHOSCOPY WITH ENDOBRONCHIAL NAVIGATION N/A 01/18/2014   Procedure: VIDEO BRONCHOSCOPY WITH ENDOBRONCHIAL NAVIGATION;  Surgeon: [name], MD;  Location: [facility];  Service: Thoracic;  Laterality: N/A;  . VIDEO BRONCHOSCOPY WITH ENDOBRONCHIAL ULTRASOUND N/A 01/18/2014   Procedure: VIDEO BRONCHOSCOPY WITH ENDOBRONCHIAL ULTRASOUND;  Surgeon: [name], MD;  Location: [facility];  Service: Thoracic;  Laterality: N/A;    Allergies: Contrast media [iodinated diagnostic  agents]; Other; and Pneumococcal vaccines  Medications: Prior to Admission medications   Medication Sig Start Date End Date Taking? Authorizing Provider  alfuzosin (UROXATRAL) 10 MG 24 hr tablet Take 10 mg by mouth at bedtime.    Yes Historical Provider, MD  ALPRAZolam (XANAX) 0.25 MG tablet Take 1 tablet (0.25 mg total) by mouth at bedtime as needed for anxiety. 08/23/15  Yes [name], MD  amLODipine (NORVASC) 5 MG tablet Take 5 mg by mouth at bedtime.    Yes Historical Provider, MD  aspirin 81 MG tablet Take 81 mg by mouth at bedtime.    Yes Historical Provider, MD  atorvastatin (LIPITOR) 40 MG tablet Take 40 mg by mouth at bedtime.    Yes Historical Provider, MD  losartan (COZAAR) 100 MG tablet  11/18/15  Yes Historical Provider, MD  tamsulosin (FLOMAX) 0.4 MG CAPS capsule Take 0.4 mg by mouth at bedtime.    Yes Historical Provider, MD  diphenhydrAMINE (BENADRYL) 25 MG tablet Take 1 tablet (25 mg total) by mouth every 6 (six) hours as needed for itching (Rash). 08/11/14   Noemi Chapel, MD  famotidine (PEPCID) 20 MG tablet Take 1 tablet (20 mg total) by mouth 2 (two) times daily. 08/11/14   Noemi Chapel, MD  lidocaine-prilocaine (EMLA) cream Apply 1 application topically as needed. Apply over port site 1 hour prior to chemo. Do not rub in medicine. 08/23/15   [name], MD     Vital Signs: BP (!) 149/88 (BP Location: Left Arm)   Pulse (!) 104  Temp 97.6 F (36.4 C) (Oral)   Resp 18   SpO2 97%   Physical Exam awake, alert. Chest with distant breath sounds bilaterally, slightly more diminished right lower lobe; clean, intact right chest wall Port-A-Cath ;heart with regular rate and rhythm. Abdomen soft, positive bowel sounds, nontender; lower extremities-no edema  Imaging: No results found.  Labs:  CBC:  Recent Labs  05/18/15 0937 08/17/15 1033 01/25/16 0930  WBC 6.2 5.9 5.1  HGB 12.9* 13.1 12.8*  HCT 38.9 39.4 37.7*  PLT 125* 133* 135*    COAGS: No results for  input(s): INR, APTT in the last 8760 hours.  BMP:  Recent Labs  05/18/15 1048 08/17/15 1033 01/25/16 0930  NA 139 140 136  K 4.1 3.9 4.2  CL 103  --   --   CO2 '26 28 26  '$ GLUCOSE 118* 222* 113  BUN 26* 20.9 16.9  CALCIUM 9.3 9.3 9.5  CREATININE 1.09 1.2 1.0  GFRNONAA >60  --   --   GFRAA >60  --   --     LIVER FUNCTION TESTS:  Recent Labs  05/18/15 1048 08/17/15 1033 01/25/16 0930  BILITOT 0.7 0.62 0.96  AST '22 21 22  '$ ALT '19 17 13  '$ ALKPHOS 88 69 90  PROT 7.0 6.8 6.8  ALBUMIN 4.1 3.9 3.7    Assessment and Plan: Patient with history of limited stage small cell lung cancer and prior right chest wall Port-A-Cath via IR service on 04/10/14. He has undergone chemoradiation and been on observation since December 2015. Recent CT scan of the chest shows no evidence for disease recurrence. He presents again today for Port-A-Cath removal. Details/risks of procedure, including but not limited to, internal bleeding, infection, injury to adjacent structures, discussed with patient and wife with their understanding and consent.  Electronically Signed: D. Rowe Robert 02/13/2016, 11:56 AM   I spent a total of 20 minutes at the the patient's bedside AND on the patient's hospital floor or unit, greater than 50% of which was counseling/coordinating care for port a cath removal

## 2016-02-18 ENCOUNTER — Telehealth: Payer: Self-pay | Admitting: Internal Medicine

## 2016-02-18 NOTE — Telephone Encounter (Signed)
All flush appointments canceled per patient request. Patient has had port removed no longer need flush appointments. Daughter requested that she be contacted for future appointments per the patient gets confused.

## 2016-03-27 DIAGNOSIS — H52223 Regular astigmatism, bilateral: Secondary | ICD-10-CM | POA: Diagnosis not present

## 2016-03-27 DIAGNOSIS — Z135 Encounter for screening for eye and ear disorders: Secondary | ICD-10-CM | POA: Diagnosis not present

## 2016-03-27 DIAGNOSIS — H5212 Myopia, left eye: Secondary | ICD-10-CM | POA: Diagnosis not present

## 2016-03-27 DIAGNOSIS — H524 Presbyopia: Secondary | ICD-10-CM | POA: Diagnosis not present

## 2016-03-27 DIAGNOSIS — H2513 Age-related nuclear cataract, bilateral: Secondary | ICD-10-CM | POA: Diagnosis not present

## 2016-04-21 ENCOUNTER — Telehealth: Payer: Self-pay | Admitting: *Deleted

## 2016-04-21 DIAGNOSIS — C3491 Malignant neoplasm of unspecified part of right bronchus or lung: Secondary | ICD-10-CM

## 2016-04-21 NOTE — Telephone Encounter (Signed)
"  This is Patrick Carr with [employer].  Calling about this patient with order for CT chest with contrast.  He is allergic to contrast and scanned without contrast in the past.  Does provider need to order a different test or will this be changed to CT without contrast." Verbal order received and read back from Dr. Julien Nordmann for CT chest without contrast.  Order given to [facility] and entered by this nurse at this time.

## 2016-06-21 ENCOUNTER — Encounter (HOSPITAL_COMMUNITY): Payer: Self-pay | Admitting: Oncology

## 2016-06-21 ENCOUNTER — Emergency Department (HOSPITAL_COMMUNITY)
Admission: EM | Admit: 2016-06-21 | Discharge: 2016-06-21 | Disposition: A | Discharge status: Home / Self Care | Payer: Medicare Other | Attending: Emergency Medicine | Admitting: Emergency Medicine

## 2016-06-21 ENCOUNTER — Emergency Department (HOSPITAL_COMMUNITY): Payer: Medicare Other

## 2016-06-21 DIAGNOSIS — I10 Essential (primary) hypertension: Secondary | ICD-10-CM | POA: Diagnosis not present

## 2016-06-21 DIAGNOSIS — Z85118 Personal history of other malignant neoplasm of bronchus and lung: Secondary | ICD-10-CM | POA: Diagnosis not present

## 2016-06-21 DIAGNOSIS — K4090 Unilateral inguinal hernia, without obstruction or gangrene, not specified as recurrent: Secondary | ICD-10-CM | POA: Diagnosis not present

## 2016-06-21 DIAGNOSIS — Z7982 Long term (current) use of aspirin: Secondary | ICD-10-CM | POA: Diagnosis not present

## 2016-06-21 DIAGNOSIS — Z79899 Other long term (current) drug therapy: Secondary | ICD-10-CM | POA: Diagnosis not present

## 2016-06-21 DIAGNOSIS — R1032 Left lower quadrant pain: Secondary | ICD-10-CM | POA: Diagnosis present

## 2016-06-21 DIAGNOSIS — Z87891 Personal history of nicotine dependence: Secondary | ICD-10-CM | POA: Diagnosis not present

## 2016-06-21 LAB — I-STAT CHEM 8, ED
BUN: 30 mg/dL — ABNORMAL HIGH (ref 6–20)
CALCIUM ION: 1.17 mmol/L (ref 1.15–1.40)
CHLORIDE: 102 mmol/L (ref 101–111)
CREATININE: 1.1 mg/dL (ref 0.61–1.24)
GLUCOSE: 113 mg/dL — AB (ref 65–99)
HCT: 39 % (ref 39.0–52.0)
Hemoglobin: 13.3 g/dL (ref 13.0–17.0)
Potassium: 4.2 mmol/L (ref 3.5–5.1)
Sodium: 140 mmol/L (ref 135–145)
TCO2: 27 mmol/L (ref 0–100)

## 2016-06-21 NOTE — Discharge Instructions (Signed)
You have a right inguinal hernia. If the hernia comes out and will not go back in with moderate pressure then you should return immediately to the emergency department. Don't do any heavy lifting or straining. Take Tylenol for pain. Follow-up with the general surgeon Dr. Excell Seltzer or one of his partners in 1-2 weeks. He will need to have the hernia repaired

## 2016-06-21 NOTE — ED Triage Notes (Addendum)
Pt presents d/t left groin swelling and pain.  Left side of groin has a firm raised area that developed this AM.  Pt denies heavy lifting or over exertion.  Rates pain 5/10.  Pt w/ hx of inguinal hernia.  Pt could not reduce hernia at home.

## 2016-06-21 NOTE — ED Provider Notes (Signed)
[facility] DEPT Provider Note   CSN: [id number] Arrival date & time: 06/21/16  1909     History   Chief Complaint Chief Complaint  Patient presents with  . Inguinal Hernia    HPI Patrick Carr is a 83 y.o. male.  Patient complains of left lower quadrant abdominal pain. He feels like there is a mass in his left inguinal area    Abdominal Pain   This is a new problem. The current episode started 6 to 12 hours ago. The problem occurs constantly. The problem has not changed since onset.The pain is associated with an unknown factor. The pain is located in the LLQ. The quality of the pain is dull. The pain is at a severity of 6/10. The pain is moderate. Pertinent negatives include diarrhea, frequency, hematuria and headaches.    Past Medical History:  Diagnosis Date  . BPH (benign prostatic hyperplasia)   . Cancer ([place])    Lung  . Hernia, inguinal left   . History of blood transfusion    artery in sinus bleed  . History of kidney stones   . Hyperlipidemia   . Hypertension   . Irregular heart rate   . Lesion of right lung    RLL  . Phlebitis 03/20/14   bilateral forearms  . Radiation 02/08/14-03/21/14   Stage III lung cancer 30 fractions  . RBBB    no palpations  . Renal cysts, acquired, bilateral     Patient Active Problem List   Diagnosis Date Noted  . Port catheter in place 10/12/2015  . Phlebitis 03/16/2014  . Small cell carcinoma of right lung ([place]) 01/26/2014  . Hypertension   . Hyperlipidemia   . BPH (benign prostatic hyperplasia)   . RBBB   . Renal cysts, acquired, bilateral   . Lesion of right lung   . Lung nodule 01/03/2014    Past Surgical History:  Procedure Laterality Date  . IR GENERIC HISTORICAL  02/13/2016   IR REMOVAL TUN ACCESS W/ PORT W/O FL MOD SED 02/13/2016 Greggory Keen, MD [facility]-INTERV RAD  . NO PAST SURGERIES    . Sinus artery surgery     not surgery  . VIDEO BRONCHOSCOPY N/A 07/04/2014   Procedure: VIDEO BRONCHOSCOPY WITH   ENDOBRONCIAL BIOPSY;  Surgeon: [name], MD;  Location: [facility];  Service: Thoracic;  Laterality: N/A;  . VIDEO BRONCHOSCOPY WITH ENDOBRONCHIAL NAVIGATION N/A 01/18/2014   Procedure: VIDEO BRONCHOSCOPY WITH ENDOBRONCHIAL NAVIGATION;  Surgeon: [name], MD;  Location: [facility];  Service: Thoracic;  Laterality: N/A;  . VIDEO BRONCHOSCOPY WITH ENDOBRONCHIAL ULTRASOUND N/A 01/18/2014   Procedure: VIDEO BRONCHOSCOPY WITH ENDOBRONCHIAL ULTRASOUND;  Surgeon: [name], MD;  Location: [facility];  Service: Thoracic;  Laterality: N/A;       Home Medications    Prior to Admission medications   Medication Sig Start Date End Date Taking? Authorizing Provider  alfuzosin (UROXATRAL) 10 MG 24 hr tablet Take 10 mg by mouth at bedtime.    Yes Historical Provider, MD  ALPRAZolam (XANAX) 0.25 MG tablet Take 1 tablet (0.25 mg total) by mouth at bedtime as needed for anxiety. 08/23/15  Yes [name], MD  aspirin 81 MG tablet Take 81 mg by mouth at bedtime.    Yes Historical Provider, MD  atorvastatin (LIPITOR) 40 MG tablet Take 40 mg by mouth at bedtime.    Yes Historical Provider, MD  losartan (COZAAR) 100 MG tablet Take 100 mg by mouth at bedtime.  11/18/15  Yes Historical Provider, MD  tamsulosin (FLOMAX) 0.4 MG CAPS capsule Take 0.4 mg by mouth at bedtime.    Yes Historical Provider, MD  diphenhydrAMINE (BENADRYL) 25 MG tablet Take 1 tablet (25 mg total) by mouth every 6 (six) hours as needed for itching (Rash). Patient not taking: Reported on 06/21/2016 08/11/14   Noemi Chapel, MD  famotidine (PEPCID) 20 MG tablet Take 1 tablet (20 mg total) by mouth 2 (two) times daily. Patient not taking: Reported on 06/21/2016 08/11/14   Noemi Chapel, MD  lidocaine-prilocaine (EMLA) cream Apply 1 application topically as needed. Apply over port site 1 hour prior to chemo. Do not rub in medicine. 08/23/15   [name], MD    Family History Family History  Problem Relation Age of Onset  . Heart  disease Mother   . Cancer Father     Social History Social History  Substance Use Topics  . Smoking status: Former Smoker    Packs/day: 0.50    Years: 62.00    Quit date: 01/29/2014  . Smokeless tobacco: Never Used  . Alcohol use No     Allergies   Contrast media [iodinated diagnostic agents]; Other; and Pneumococcal vaccines   Review of Systems Review of Systems  Constitutional: Negative for appetite change and fatigue.  HENT: Negative for congestion, ear discharge and sinus pressure.   Eyes: Negative for discharge.  Respiratory: Negative for cough.   Cardiovascular: Negative for chest pain.  Gastrointestinal: Positive for abdominal pain. Negative for diarrhea.  Genitourinary: Negative for frequency and hematuria.  Musculoskeletal: Negative for back pain.  Skin: Negative for rash.  Neurological: Negative for seizures and headaches.  Psychiatric/Behavioral: Negative for hallucinations.     Physical Exam Updated Vital Signs BP (!) 161/103 (BP Location: Right Arm)   Pulse 105   Temp 97.6 F (36.4 C) (Oral)   Resp 18   Ht '5\' 5"'$  (1.651 m)   Wt 116 lb 1.6 oz (52.7 kg)   SpO2 95%   BMI 19.32 kg/m   Physical Exam  Constitutional: He is oriented to person, place, and time. He appears well-developed.  HENT:  Head: Normocephalic.  Eyes: Conjunctivae and EOM are normal. No scleral icterus.  Neck: Neck supple. No thyromegaly present.  Cardiovascular: Normal rate and regular rhythm.  Exam reveals no gallop and no friction rub.   No murmur heard. Pulmonary/Chest: No stridor. He has no wheezes. He has no rales. He exhibits no tenderness.  Abdominal: There is tenderness. There is no rebound.  Patient has a left inguinal hernia that about 3 cm in diameter. The hernia was reduced with moderate pressure. Patient's abdominal exam was normal after that   Musculoskeletal: Normal range of motion. He exhibits no edema.  Lymphadenopathy:    He has no cervical adenopathy.    Neurological: He is oriented to person, place, and time. He exhibits normal muscle tone. Coordination normal.  Skin: No rash noted. No erythema.  Psychiatric: He has a normal mood and affect. His behavior is normal.     ED Treatments / Results  Labs (all labs ordered are listed, but only abnormal results are displayed) Labs Reviewed  I-STAT CHEM 8, ED - Abnormal; Notable for the following:       Result Value   BUN 30 (*)    Glucose, Bld 113 (*)    All other components within normal limits    EKG  EKG Interpretation None       Radiology No results found.  Procedures Procedures (  including critical care time)  Medications Ordered in ED Medications - No data to display   Initial Impression / Assessment and Plan / ED Course  I have reviewed the triage vital signs and the nursing notes.  Pertinent labs & imaging results that were available during my care of the patient were reviewed by me and considered in my medical decision making (see chart for details).     Patient with reducible left inguinal hernia'  CT scan shows inguinal hernia he is to follow-up with surgery  Final Clinical Impressions(s) / ED Diagnoses   Final diagnoses:  None    New Prescriptions New Prescriptions   No medications on file     [name] Ferguson, MD 06/21/16 2348

## 2016-07-24 IMAGING — CT CT CHEST W/ CM
2 of 3 series · 15 of 36 positions shown, 18 images · IV contrast (omnipaque)
Comparison: 05/01/2014

CLINICAL DATA: Lung cancer diagnosed December 2013, completed
radiation therapy. Ongoing chemotherapy.

EXAM:
CT CHEST WITH CONTRAST
TECHNIQUE: Multidetector CT imaging of the chest was performed during
intravenous contrast administration.
CONTRAST:  80mL OMNIPAQUE IOHEXOL 300 MG/ML  SOLN

[Series 2: chest with st · axial · 0.69mm/px · z∈[-310,-35]mm · 12 of 65 slices shown, 15 images]
[im 5/65  mediastinal]
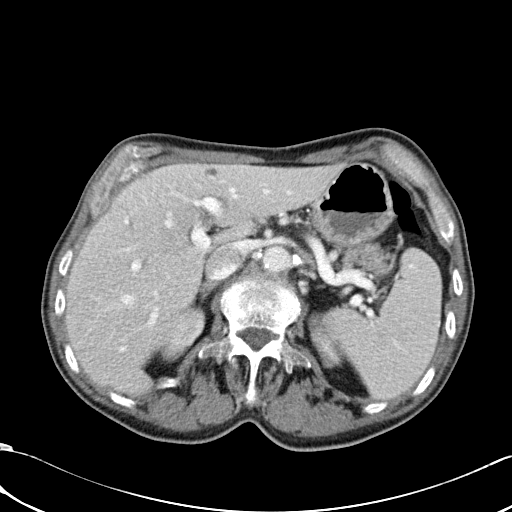
[im 5/65  lung]
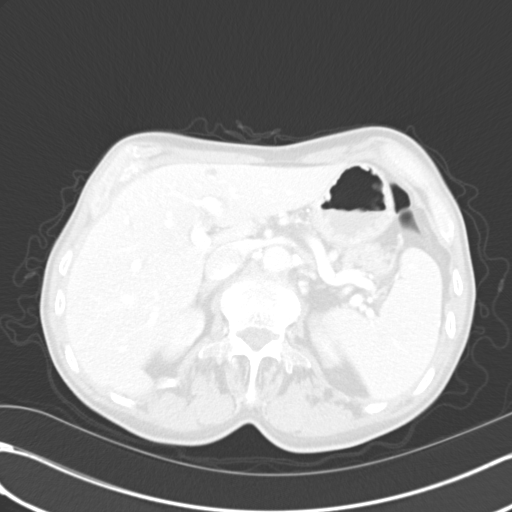
[im 10/65  lung]
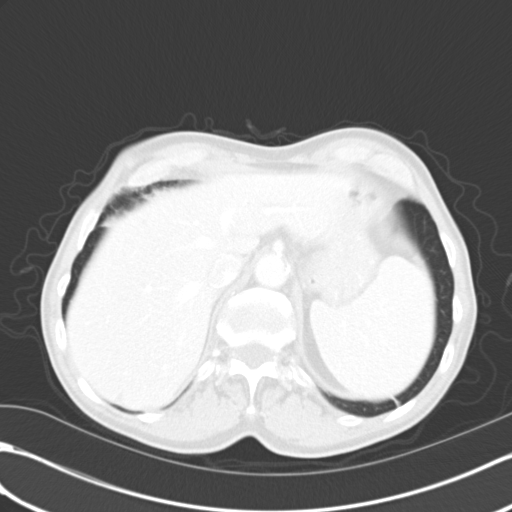
[im 15/65  lung]
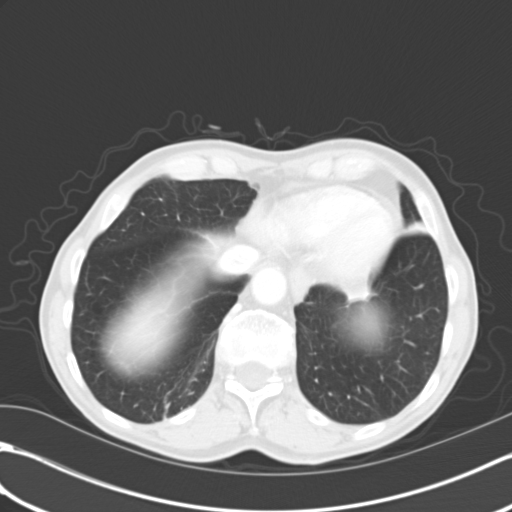
[im 19/65  lung]
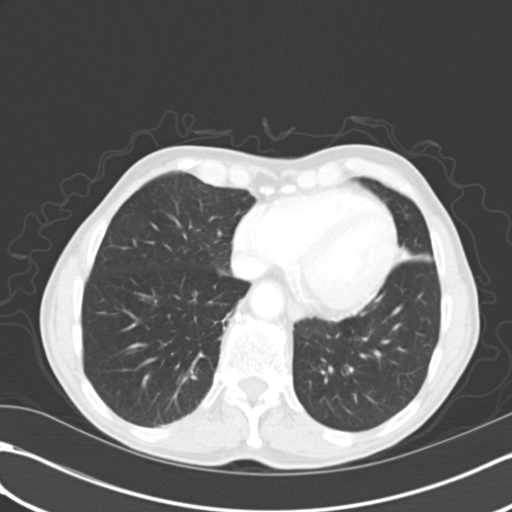
[im 24/65  mediastinal]
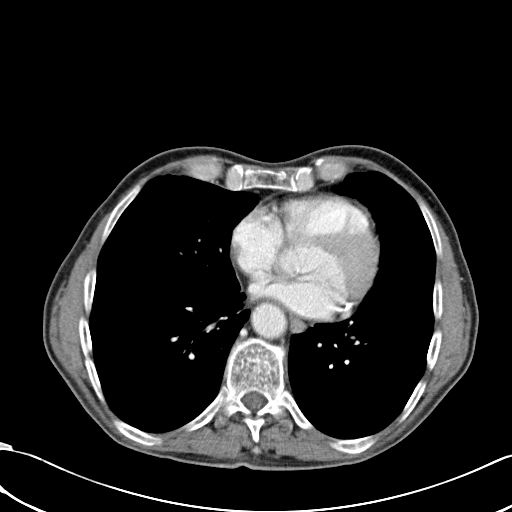
[im 24/65  lung]
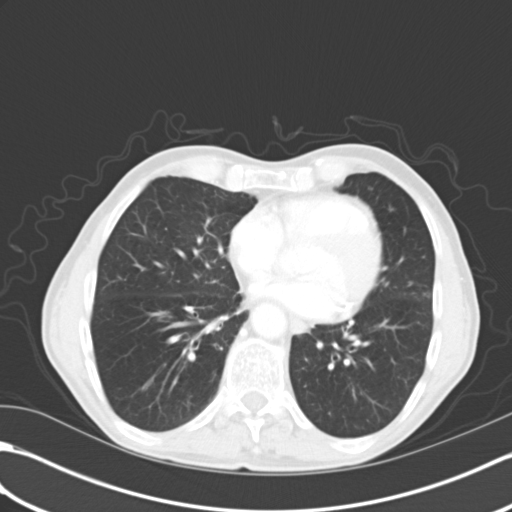
[im 29/65  lung]
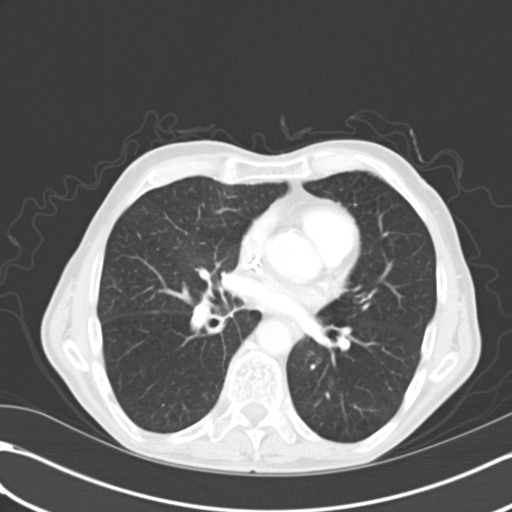
[im 36/65  lung]
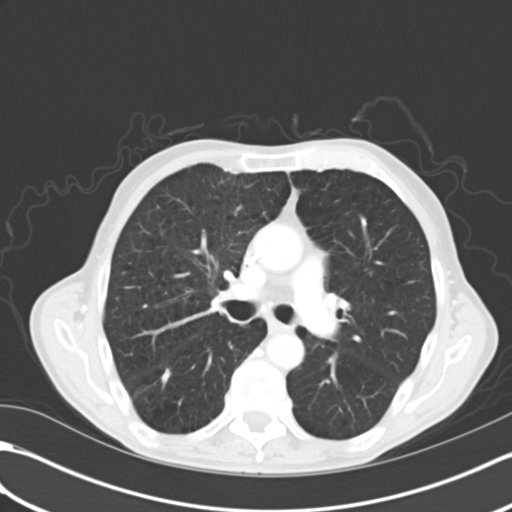
[im 41/65  lung]
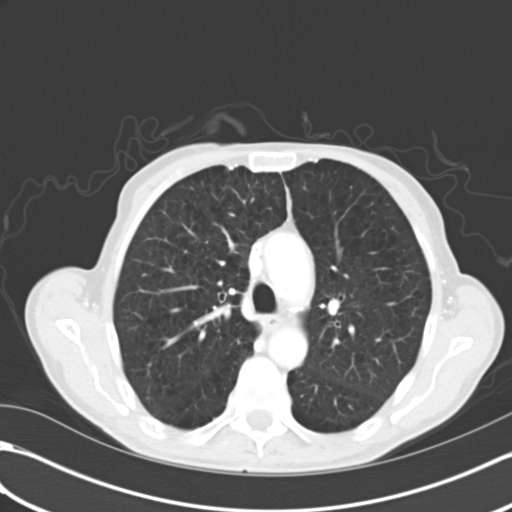
[im 46/65  mediastinal]
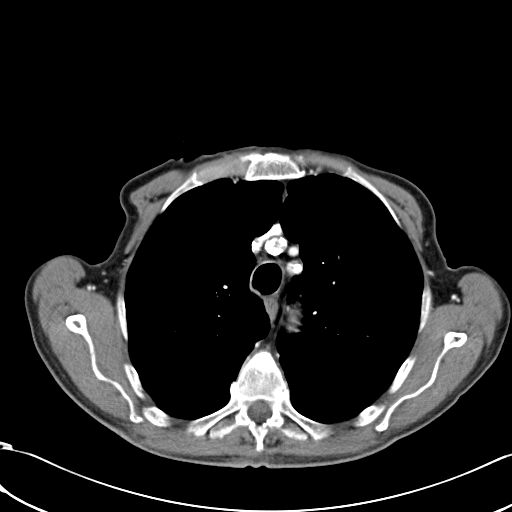
[im 46/65  lung]
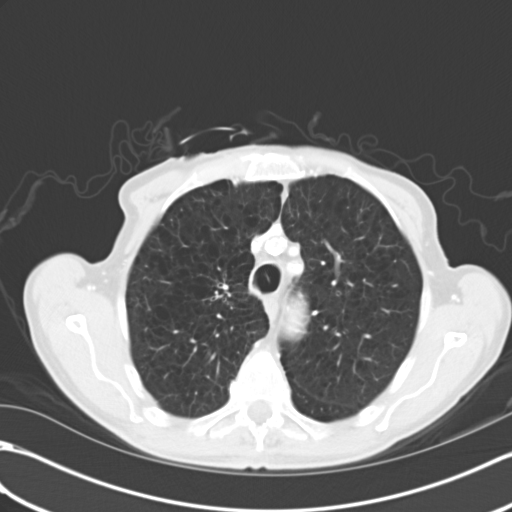
[im 50/65  lung]
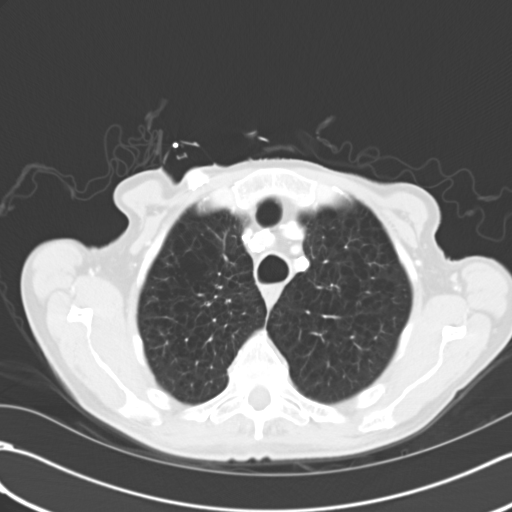
[im 55/65  lung]
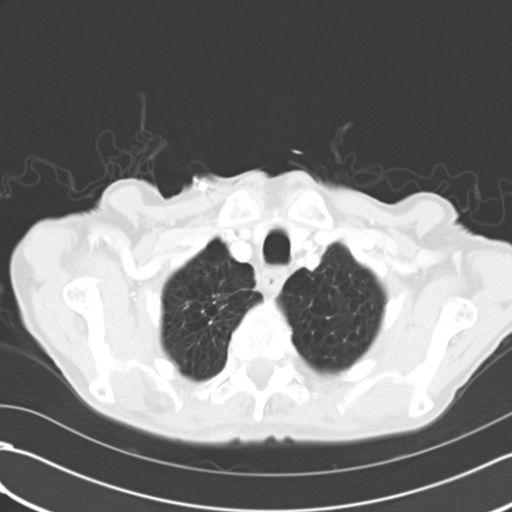
[im 60/65  lung]
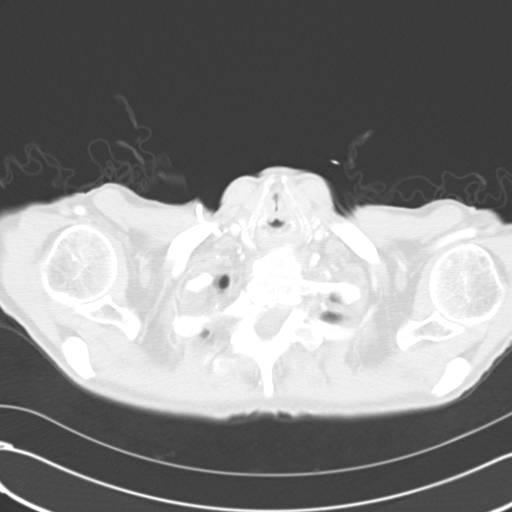

[Series 602: <mpr thick range> · coronal · 0.69mm/px · 3 of 84 slices shown]
[im 17/84  lung]
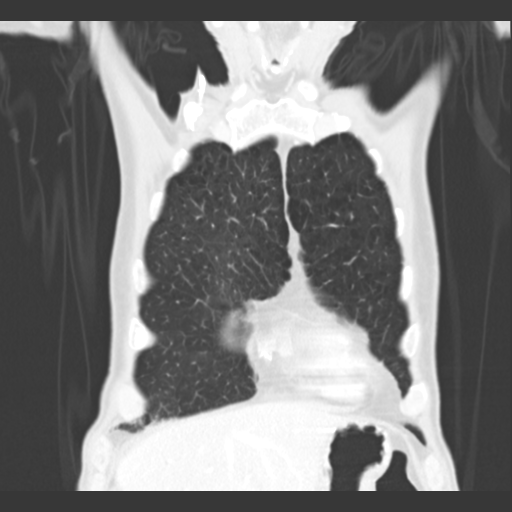
[im 34/84  lung]
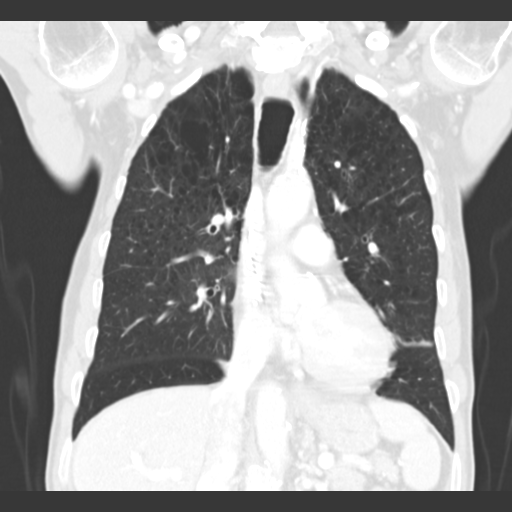
[im 50/84  lung]
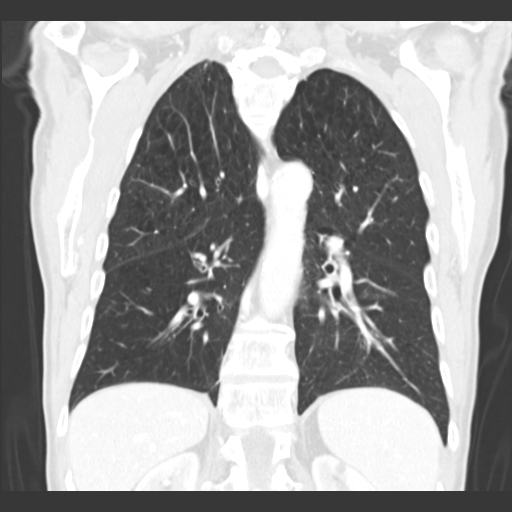

[15 of 36 positions shown; findings below may reference images not displayed]

FINDINGS: Mediastinum/Nodes: Heart size is normal. Right-sided Port-A-Cath in
place with tip at the cavoatrial junction. No pericardial effusion.
Small mediastinal nodes measuring 5 mm and smaller are stable. There
is a persistent nodular filling defect within the right lateral
trachea measuring 0.9 cm image 21 series 5. Moderate to severe
atheromatous aortic calcification noted without aneurysm.

Lungs/Pleura: Emphysematous changes are noted. Right apical pleural
thickening/scarring is stable with internal calcification, measuring
overall 3.2 x 2.0 cm, stable. Right lower lobe spiculated nodule
measuring 1.1 x 0.4 cm image 30 is stable. Mild central bronchial
wall thickening is reidentified. No pleural effusion.

Upper abdomen: Low-density renal cortical lesions most compatible
with cysts are reidentified. Adrenal glands are normal.

Musculoskeletal: No acute osseous abnormality. Multilevel disc
degenerative change noted within the thoracic spine with extensive
mid thoracic kyphosis reidentified and stable mid thoracic
compression deformities.
IMPRESSION: No significant interval change in probable right apical scarring and
a right lower lobe spiculated 1.1 cm nodule.

Right lateral tracheal intraluminal lesion, for which stability
raises the question of a polyp or a neoplasm rather than secretions.
Further evaluation at bronchoscopy is recommended.

These results will be called to the ordering clinician or
representative by the Radiologist Assistant, and communication
documented in the PACS or zVision Dashboard.

## 2016-07-30 ENCOUNTER — Telehealth: Payer: Self-pay | Admitting: *Deleted

## 2016-07-30 NOTE — Telephone Encounter (Signed)
"  This is Radiology Central Scheduling.  Which order are we to use and what location?"  Patient is allergic to contrast.  Patrick Carr location is great but if he needs Forestine Na we can see this result also.  No further questions.  Old order discontinued at this time.

## 2016-08-01 ENCOUNTER — Ambulatory Visit (HOSPITAL_COMMUNITY)
Admission: RE | Admit: 2016-08-01 | Discharge: 2016-08-01 | Disposition: A | Discharge status: Home / Self Care | Payer: Medicare Other | Source: Ambulatory Visit | Attending: Internal Medicine | Admitting: Internal Medicine

## 2016-08-01 ENCOUNTER — Other Ambulatory Visit (HOSPITAL_BASED_OUTPATIENT_CLINIC_OR_DEPARTMENT_OTHER): Payer: Medicare Other

## 2016-08-01 DIAGNOSIS — I2510 Atherosclerotic heart disease of native coronary artery without angina pectoris: Secondary | ICD-10-CM | POA: Diagnosis not present

## 2016-08-01 DIAGNOSIS — J439 Emphysema, unspecified: Secondary | ICD-10-CM | POA: Diagnosis not present

## 2016-08-01 DIAGNOSIS — Z85118 Personal history of other malignant neoplasm of bronchus and lung: Secondary | ICD-10-CM

## 2016-08-01 DIAGNOSIS — I700 Atherosclerosis of aorta: Secondary | ICD-10-CM | POA: Insufficient documentation

## 2016-08-01 DIAGNOSIS — C3491 Malignant neoplasm of unspecified part of right bronchus or lung: Secondary | ICD-10-CM

## 2016-08-01 DIAGNOSIS — C3431 Malignant neoplasm of lower lobe, right bronchus or lung: Secondary | ICD-10-CM | POA: Diagnosis not present

## 2016-08-01 DIAGNOSIS — J701 Chronic and other pulmonary manifestations due to radiation: Secondary | ICD-10-CM | POA: Diagnosis not present

## 2016-08-01 LAB — CBC WITH DIFFERENTIAL/PLATELET
BASO%: 0.2 % (ref 0.0–2.0)
BASOS ABS: 0 10*3/uL (ref 0.0–0.1)
EOS ABS: 0 10*3/uL (ref 0.0–0.5)
EOS%: 0.5 % (ref 0.0–7.0)
HEMATOCRIT: 41.7 % (ref 38.4–49.9)
HEMOGLOBIN: 14 g/dL (ref 13.0–17.1)
LYMPH#: 0.5 10*3/uL — AB (ref 0.9–3.3)
LYMPH%: 8.1 % — ABNORMAL LOW (ref 14.0–49.0)
MCH: 32.4 pg (ref 27.2–33.4)
MCHC: 33.6 g/dL (ref 32.0–36.0)
MCV: 96.5 fL (ref 79.3–98.0)
MONO#: 0.6 10*3/uL (ref 0.1–0.9)
MONO%: 9.6 % (ref 0.0–14.0)
NEUT%: 81.6 % — ABNORMAL HIGH (ref 39.0–75.0)
NEUTROS ABS: 5.1 10*3/uL (ref 1.5–6.5)
PLATELETS: 145 10*3/uL (ref 140–400)
RBC: 4.32 10*6/uL (ref 4.20–5.82)
RDW: 13.9 % (ref 11.0–14.6)
WBC: 6.3 10*3/uL (ref 4.0–10.3)

## 2016-08-01 LAB — COMPREHENSIVE METABOLIC PANEL
ALBUMIN: 4.4 g/dL (ref 3.5–5.0)
ALK PHOS: 100 U/L (ref 40–150)
ALT: 20 U/L (ref 0–55)
AST: 30 U/L (ref 5–34)
Anion Gap: 10 mEq/L (ref 3–11)
BILIRUBIN TOTAL: 1.2 mg/dL (ref 0.20–1.20)
BUN: 27.6 mg/dL — AB (ref 7.0–26.0)
CALCIUM: 10 mg/dL (ref 8.4–10.4)
CO2: 29 mEq/L (ref 22–29)
Chloride: 102 mEq/L (ref 98–109)
Creatinine: 1.3 mg/dL (ref 0.7–1.3)
EGFR: 54 mL/min/{1.73_m2} — AB (ref >90)
GLUCOSE: 136 mg/dL (ref 70–140)
Potassium: 4.1 mEq/L (ref 3.5–5.1)
SODIUM: 140 meq/L (ref 136–145)
TOTAL PROTEIN: 7.4 g/dL (ref 6.4–8.3)

## 2016-08-04 ENCOUNTER — Encounter: Payer: Self-pay | Admitting: Internal Medicine

## 2016-08-04 ENCOUNTER — Telehealth: Payer: Self-pay | Admitting: Internal Medicine

## 2016-08-04 ENCOUNTER — Ambulatory Visit (HOSPITAL_BASED_OUTPATIENT_CLINIC_OR_DEPARTMENT_OTHER): Payer: Medicare Other | Admitting: Internal Medicine

## 2016-08-04 VITALS — BP 116/63 | HR 106 | Temp 97.8°F | Resp 19 | Ht 65.0 in | Wt 124.2 lb

## 2016-08-04 DIAGNOSIS — Z85118 Personal history of other malignant neoplasm of bronchus and lung: Secondary | ICD-10-CM | POA: Diagnosis not present

## 2016-08-04 DIAGNOSIS — C3491 Malignant neoplasm of unspecified part of right bronchus or lung: Secondary | ICD-10-CM

## 2016-08-04 DIAGNOSIS — R0609 Other forms of dyspnea: Secondary | ICD-10-CM

## 2016-08-04 NOTE — Progress Notes (Signed)
[facility] [phone]   [phone]  OFFICE PROGRESS NOTE  [name], MD [address] [place] [address]  DIAGNOSIS: Limited stage small cell lung cancer, stage IIA (T1a, N1, M0) presented with right lower lobe nodule in addition to right hilar lymphadenopathy diagnosed in August of 2015.  PRIOR THERAPY: Systemic chemotherapy with cisplatin 60 mg/M2 on day 1 and etoposide at 120 mg/M2 on days 1, 2 and 3 with Neulasta support on day 4. Status post 6 cycles, last dose was giving 05/23/2014 with partial response. He is not interested in prophylactic cranial irradiation after discussion with Dr. [name].  CURRENT THERAPY: Observation.  INTERVAL HISTORY: Patrick Carr 83 y.o. male came to the clinic today for six-month follow-up visit accompanied by his daughter. The patient is feeling fine today was no specific complaints. He denied having any chest pain but has shortness of breath with exertion with no cough or hemoptysis. He has no fever or chills. He denied having any significant weight loss or night sweats. He has no headache or visual changes. He had repeat CT scan of the chest performed recently and he is here for evaluation and discussion of his scan results.   MEDICAL HISTORY: Past Medical History:  Diagnosis Date  . BPH (benign prostatic hyperplasia)   . Cancer ([place])    Lung  . Hernia, inguinal left   . History of blood transfusion    artery in sinus bleed  . History of kidney stones   . Hyperlipidemia   . Hypertension   . Irregular heart rate   . Lesion of right lung    RLL  . Phlebitis 03/20/14   bilateral forearms  . Radiation 02/08/14-03/21/14   Stage III lung cancer 30 fractions  . RBBB    no palpations  . Renal cysts, acquired, bilateral     ALLERGIES:  is allergic to contrast media [iodinated diagnostic agents]; other; and pneumococcal vaccines.  MEDICATIONS:  Current Outpatient  Prescriptions  Medication Sig Dispense Refill  . alfuzosin (UROXATRAL) 10 MG 24 hr tablet Take 10 mg by mouth at bedtime.     . ALPRAZolam (XANAX) 0.25 MG tablet Take 1 tablet (0.25 mg total) by mouth at bedtime as needed for anxiety. 30 tablet 0  . aspirin 81 MG tablet Take 81 mg by mouth at bedtime.     [name] atorvastatin (LIPITOR) 40 MG tablet Take 40 mg by mouth at bedtime.     . diphenhydrAMINE (BENADRYL) 25 MG tablet Take 1 tablet (25 mg total) by mouth every 6 (six) hours as needed for itching (Rash). 30 tablet 0  . famotidine (PEPCID) 20 MG tablet Take 1 tablet (20 mg total) by mouth 2 (two) times daily. 30 tablet 0  . lidocaine-prilocaine (EMLA) cream Apply 1 application topically as needed. Apply over port site 1 hour prior to chemo. Do not rub in medicine. 30 g 0  . losartan (COZAAR) 100 MG tablet Take 100 mg by mouth at bedtime.     . tamsulosin (FLOMAX) 0.4 MG CAPS capsule Take 0.4 mg by mouth at bedtime.      No current facility-administered medications for this visit.    Facility-Administered Medications Ordered in Other Visits  Medication Dose Route Frequency Provider Last Rate Last Dose  . sodium chloride 0.9 % injection 10 mL  10 mL Intravenous PRN [name], MD   10 mL at 05/23/14 1635  . sodium chloride 0.9 % injection 10  mL  10 mL Intravenous PRN [name], MD   10 mL at 05/18/15 1255    SURGICAL HISTORY:  Past Surgical History:  Procedure Laterality Date  . IR GENERIC HISTORICAL  02/13/2016   IR REMOVAL TUN ACCESS W/ PORT W/O FL MOD SED 02/13/2016 Greggory Keen, MD [facility]-INTERV RAD  . NO PAST SURGERIES    . Sinus artery surgery     not surgery  . VIDEO BRONCHOSCOPY N/A 07/04/2014   Procedure: VIDEO BRONCHOSCOPY WITH  ENDOBRONCIAL BIOPSY;  Surgeon: [name], MD;  Location: [facility];  Service: Thoracic;  Laterality: N/A;  . VIDEO BRONCHOSCOPY WITH ENDOBRONCHIAL NAVIGATION N/A 01/18/2014   Procedure: VIDEO BRONCHOSCOPY WITH ENDOBRONCHIAL NAVIGATION;  Surgeon:  [name], MD;  Location: [facility];  Service: Thoracic;  Laterality: N/A;  . VIDEO BRONCHOSCOPY WITH ENDOBRONCHIAL ULTRASOUND N/A 01/18/2014   Procedure: VIDEO BRONCHOSCOPY WITH ENDOBRONCHIAL ULTRASOUND;  Surgeon: [name], MD;  Location: [facility];  Service: Thoracic;  Laterality: N/A;    REVIEW OF SYSTEMS:  A comprehensive review of systems was negative except for: Respiratory: positive for dyspnea on exertion   PHYSICAL EXAMINATION: General appearance: alert, cooperative and no distress Head: Normocephalic, without obvious abnormality, atraumatic Neck: no adenopathy, no JVD, supple, symmetrical, trachea midline and thyroid not enlarged, symmetric, no tenderness/mass/nodules Lymph nodes: Cervical, supraclavicular, and axillary nodes normal. Resp: clear to auscultation bilaterally Back: symmetric, no curvature. ROM normal. No CVA tenderness. Cardio: regular rate and rhythm, S1, S2 normal, no murmur, click, rub or gallop GI: soft, non-tender; bowel sounds normal; no masses,  no organomegaly Extremities: extremities normal, atraumatic, no cyanosis or edema  ECOG PERFORMANCE STATUS: 1 - Symptomatic but completely ambulatory  Blood pressure 116/63, pulse (!) 106, temperature 97.8 F (36.6 C), temperature source Oral, resp. rate 19, height '5\' 5"'$  (1.651 m), weight 124 lb 4 oz (56.4 kg), SpO2 97 %.  LABORATORY DATA: Lab Results  Component Value Date   WBC 6.3 08/01/2016   HGB 14.0 08/01/2016   HCT 41.7 08/01/2016   MCV 96.5 08/01/2016   PLT 145 08/01/2016      Chemistry      Component Value Date/Time   NA 140 08/01/2016 1005   K 4.1 08/01/2016 1005   CL 102 06/21/2016 2146   CO2 29 08/01/2016 1005   BUN 27.6 (H) 08/01/2016 1005   CREATININE 1.3 08/01/2016 1005      Component Value Date/Time   CALCIUM 10.0 08/01/2016 1005   ALKPHOS 100 08/01/2016 1005   AST 30 08/01/2016 1005   ALT 20 08/01/2016 1005   BILITOT 1.20 08/01/2016 1005       RADIOGRAPHIC STUDIES: Ct  Chest Wo Contrast  Result Date: 08/02/2016 CLINICAL DATA:  Re- stage small cell lung cancer of the right lower lobe diagnosed August 2015 status post systemic chemotherapy and radiation therapy, presenting for restaging on observation. EXAM: CT CHEST WITHOUT CONTRAST TECHNIQUE: Multidetector CT imaging of the chest was performed following the standard protocol without IV contrast. COMPARISON:  02/04/2016 chest CT. FINDINGS: Cardiovascular: Normal heart size. No significant pericardial fluid/thickening. Left main and left circumflex coronary atherosclerosis. Atherosclerotic nonaneurysmal thoracic aorta. Normal caliber pulmonary arteries. Mediastinum/Nodes: No discrete thyroid nodules. Unremarkable esophagus. No pathologically enlarged axillary, mediastinal or gross hilar lymph nodes, noting limited sensitivity for the detection of hilar adenopathy on this noncontrast study. Lungs/Pleura: No pneumothorax. No pleural effusion. Moderate centrilobular emphysema with mild diffuse bronchial wall thickening. Partially calcified irregular subpleural nodular opacity at the right lung apex measuring 2.5  x 1.6 cm (series 5/ image 16), previously 2.6 x 1.5 cm using similar measurement technique, stable, compatible with benign pleural-parenchymal scarring. Superior segment right lower lobe 8 x 5 mm pulmonary nodule (series 5/ image 66), previously 8 x 5 mm, stable. Stable bandlike region of reticulation and ground-glass attenuation in the parahilar right lung and superior segment right lower lobe, compatible with mild radiation fibrosis. No acute consolidative airspace disease or new significant pulmonary nodules. Upper abdomen: Simple renal cysts in the upper kidneys bilaterally measuring up to 3.2 cm in the posterior upper left kidney. Small hyperdense renal cortical lesions in the upper left kidney measuring up to 1.0 cm, incompletely characterized on this noncontrast scan, stable, suggesting benign hemorrhagic/  proteinaceous renal cysts. Stable curvilinear calcifications at the periphery of the normal size spleen. Musculoskeletal: No aggressive appearing focal osseous lesions. Moderate thoracic spondylosis. Stable 1.5 cm subcutaneous nodule in the medial ventral right chest, most compatible with a sebaceous cyst. IMPRESSION: 1. Stable treated neoplasm in the superior segment right lower lobe. Stable mild radiation fibrosis in the right mid lung. 2. No evidence of metastatic disease in the chest. 3. Aortic atherosclerosis. Left main and 1 vessel coronary atherosclerosis . 4. Moderate emphysema with mild diffuse bronchial wall thickening, suggesting COPD . Electronically Signed   By: Ilona Sorrel M.D.   On: 08/02/2016 09:26    ASSESSMENT AND PLAN:  This is a very pleasant 83 years old white male with limited stage small cell lung cancer and completed 6 cycles of systemic chemotherapy with cisplatin and etoposide concurrent with radiation. He has almost complete response to this treatment and has been observation since December 2015. The patient is doing fine and the recent CT scan of the chest showed no evidence for disease recurrence. I discussed the scan with the patient and his daughter. I recommended for him to continue on observation with repeat CT scan of the chest in 6 months. He was advised to call immediately if he has any concerning symptoms in the interval. The patient voices understanding of current disease status and treatment options and is in agreement with the current care plan.  All questions were answered. The patient knows to call the clinic with any problems, questions or concerns. We can certainly see the patient much sooner if necessary. I spent 10 minutes counseling the patient face to face. The total time spent in the appointment was 15 minutes. Disclaimer: This note was dictated with voice recognition software. Similar sounding words can inadvertently be transcribed and may not be  corrected upon review.

## 2016-08-04 NOTE — Telephone Encounter (Signed)
Appointments scheduled per 3/12 LOS. Patient given AVS report and calendar with future scheduled appointments. Patient states that they do not take any form of contrast per having an allergy to the dye.

## 2016-08-06 DIAGNOSIS — D692 Other nonthrombocytopenic purpura: Secondary | ICD-10-CM | POA: Diagnosis not present

## 2016-08-06 DIAGNOSIS — I10 Essential (primary) hypertension: Secondary | ICD-10-CM | POA: Diagnosis not present

## 2016-12-22 ENCOUNTER — Other Ambulatory Visit: Payer: Self-pay | Admitting: Medical Oncology

## 2016-12-22 DIAGNOSIS — C3491 Malignant neoplasm of unspecified part of right bronchus or lung: Secondary | ICD-10-CM

## 2017-01-09 DIAGNOSIS — D170 Benign lipomatous neoplasm of skin and subcutaneous tissue of head, face and neck: Secondary | ICD-10-CM | POA: Diagnosis not present

## 2017-01-30 ENCOUNTER — Other Ambulatory Visit (HOSPITAL_BASED_OUTPATIENT_CLINIC_OR_DEPARTMENT_OTHER): Payer: Medicare Other

## 2017-01-30 ENCOUNTER — Ambulatory Visit (HOSPITAL_COMMUNITY)
Admission: RE | Admit: 2017-01-30 | Discharge: 2017-01-30 | Disposition: A | Discharge status: Home / Self Care | Payer: Medicare Other | Source: Ambulatory Visit | Attending: Internal Medicine | Admitting: Internal Medicine

## 2017-01-30 DIAGNOSIS — I700 Atherosclerosis of aorta: Secondary | ICD-10-CM | POA: Insufficient documentation

## 2017-01-30 DIAGNOSIS — Z85118 Personal history of other malignant neoplasm of bronchus and lung: Secondary | ICD-10-CM | POA: Diagnosis not present

## 2017-01-30 DIAGNOSIS — J439 Emphysema, unspecified: Secondary | ICD-10-CM | POA: Insufficient documentation

## 2017-01-30 DIAGNOSIS — Z125 Encounter for screening for malignant neoplasm of prostate: Secondary | ICD-10-CM | POA: Diagnosis not present

## 2017-01-30 DIAGNOSIS — Z9889 Other specified postprocedural states: Secondary | ICD-10-CM | POA: Insufficient documentation

## 2017-01-30 DIAGNOSIS — J8410 Pulmonary fibrosis, unspecified: Secondary | ICD-10-CM | POA: Insufficient documentation

## 2017-01-30 DIAGNOSIS — E7800 Pure hypercholesterolemia, unspecified: Secondary | ICD-10-CM | POA: Diagnosis not present

## 2017-01-30 DIAGNOSIS — Z0000 Encounter for general adult medical examination without abnormal findings: Secondary | ICD-10-CM | POA: Diagnosis not present

## 2017-01-30 DIAGNOSIS — I10 Essential (primary) hypertension: Secondary | ICD-10-CM | POA: Diagnosis not present

## 2017-01-30 DIAGNOSIS — Z7982 Long term (current) use of aspirin: Secondary | ICD-10-CM | POA: Diagnosis not present

## 2017-01-30 DIAGNOSIS — C3491 Malignant neoplasm of unspecified part of right bronchus or lung: Secondary | ICD-10-CM | POA: Diagnosis not present

## 2017-01-30 LAB — CBC WITH DIFFERENTIAL/PLATELET
BASO%: 0.4 % (ref 0.0–2.0)
Basophils Absolute: 0 10*3/uL (ref 0.0–0.1)
EOS%: 1.4 % (ref 0.0–7.0)
Eosinophils Absolute: 0.1 10*3/uL (ref 0.0–0.5)
HCT: 39.7 % (ref 38.4–49.9)
HGB: 13.3 g/dL (ref 13.0–17.1)
LYMPH#: 0.5 10*3/uL — AB (ref 0.9–3.3)
LYMPH%: 11.3 % — AB (ref 14.0–49.0)
MCH: 32.1 pg (ref 27.2–33.4)
MCHC: 33.5 g/dL (ref 32.0–36.0)
MCV: 96 fL (ref 79.3–98.0)
MONO#: 0.4 10*3/uL (ref 0.1–0.9)
MONO%: 10 % (ref 0.0–14.0)
NEUT%: 76.9 % — ABNORMAL HIGH (ref 39.0–75.0)
NEUTROS ABS: 3.3 10*3/uL (ref 1.5–6.5)
Platelets: 125 10*3/uL — ABNORMAL LOW (ref 140–400)
RBC: 4.14 10*6/uL — AB (ref 4.20–5.82)
RDW: 13.7 % (ref 11.0–14.6)
WBC: 4.2 10*3/uL (ref 4.0–10.3)

## 2017-01-30 LAB — COMPREHENSIVE METABOLIC PANEL
ALBUMIN: 3.8 g/dL (ref 3.5–5.0)
ALK PHOS: 100 U/L (ref 40–150)
ALT: 15 U/L (ref 0–55)
ANION GAP: 9 meq/L (ref 3–11)
AST: 22 U/L (ref 5–34)
BUN: 20.3 mg/dL (ref 7.0–26.0)
CALCIUM: 9.7 mg/dL (ref 8.4–10.4)
CHLORIDE: 102 meq/L (ref 98–109)
CO2: 31 mEq/L — ABNORMAL HIGH (ref 22–29)
Creatinine: 1.1 mg/dL (ref 0.7–1.3)
EGFR: 61 mL/min/{1.73_m2} — AB (ref >90)
Glucose: 133 mg/dl (ref 70–140)
POTASSIUM: 3.9 meq/L (ref 3.5–5.1)
Sodium: 141 mEq/L (ref 136–145)
Total Bilirubin: 0.64 mg/dL (ref 0.20–1.20)
Total Protein: 6.9 g/dL (ref 6.4–8.3)

## 2017-02-02 ENCOUNTER — Encounter: Payer: Self-pay | Admitting: Internal Medicine

## 2017-02-02 ENCOUNTER — Telehealth: Payer: Self-pay | Admitting: Internal Medicine

## 2017-02-02 ENCOUNTER — Ambulatory Visit (HOSPITAL_BASED_OUTPATIENT_CLINIC_OR_DEPARTMENT_OTHER): Payer: Medicare Other | Admitting: Internal Medicine

## 2017-02-02 VITALS — BP 138/80 | HR 96 | Temp 98.2°F | Resp 18 | Ht 65.0 in | Wt 122.3 lb

## 2017-02-02 DIAGNOSIS — Z85118 Personal history of other malignant neoplasm of bronchus and lung: Secondary | ICD-10-CM | POA: Diagnosis not present

## 2017-02-02 DIAGNOSIS — I10 Essential (primary) hypertension: Secondary | ICD-10-CM

## 2017-02-02 DIAGNOSIS — C3491 Malignant neoplasm of unspecified part of right bronchus or lung: Secondary | ICD-10-CM

## 2017-02-02 NOTE — Progress Notes (Signed)
[place] [phone]   Fax:([phone] [place], [place] [address] [place] [address]  DIAGNOSIS: Limited stage small cell lung cancer, stage IIA (T1a, N1, M0) presented with right lower lobe nodule in addition to right hilar lymphadenopathy diagnosed in August of 2015.  PRIOR THERAPY: Systemic chemotherapy with cisplatin 60 mg/M2 on day 1 and etoposide at 120 mg/M2 on days 1, 2 and 3 with Neulasta support on day 4. Status post 6 cycles, last dose was giving 05/23/2014 with partial response. He is not interested in prophylactic cranial irradiation after discussion with Dr. [name].  CURRENT THERAPY: Observation.  INTERVAL HISTORY: Patrick Carr 83 y.o. male returns to the clinic today for follow-up visit accompanied by his daughter. The patient is feeling fine today with no specific complaints. He denied having any chest pain, shortness of breath, cough or hemoptysis. He denied having any fever or chills. He has no nausea, vomiting, diarrhea or constipation. The patient has no recent weight loss or night sweats. He has been observation. He had repeat CT scan of the chest performed recently and he is here for evaluation and discussion of his scan results.  MEDICAL HISTORY: Past Medical History:  Diagnosis Date  . BPH (benign prostatic hyperplasia)   . Cancer ([place])    Lung  . Hernia, inguinal left   . History of blood transfusion    artery in sinus bleed  . History of kidney stones   . Hyperlipidemia   . Hypertension   . Irregular heart rate   . Lesion of right lung    RLL  . Phlebitis 03/20/14   bilateral forearms  . Radiation 02/08/14-03/21/14   Stage III lung cancer 30 fractions  . RBBB    no palpations  . Renal cysts, acquired, bilateral     ALLERGIES:  is allergic to contrast media [iodinated diagnostic agents]; other; and pneumococcal vaccines.  MEDICATIONS:  Current Outpatient  Prescriptions  Medication Sig Dispense Refill  . alfuzosin (UROXATRAL) 10 MG 24 hr tablet Take 10 mg by mouth at bedtime.     . ALPRAZolam (XANAX) 0.25 MG tablet Take 1 tablet (0.25 mg total) by mouth at bedtime as needed for anxiety. 30 tablet 0  . aspirin 81 MG tablet Take 81 mg by mouth at bedtime.     [name] atorvastatin (LIPITOR) 40 MG tablet Take 40 mg by mouth at bedtime.     . diphenhydrAMINE (BENADRYL) 25 MG tablet Take 1 tablet (25 mg total) by mouth every 6 (six) hours as needed for itching (Rash). 30 tablet 0  . famotidine (PEPCID) 20 MG tablet Take 1 tablet (20 mg total) by mouth 2 (two) times daily. 30 tablet 0  . lidocaine-prilocaine (EMLA) cream Apply 1 application topically as needed. Apply over port site 1 hour prior to chemo. Do not rub in medicine. 30 g 0  . losartan (COZAAR) 100 MG tablet Take 100 mg by mouth at bedtime.     . tamsulosin (FLOMAX) 0.4 MG CAPS capsule Take 0.4 mg by mouth at bedtime.      No current facility-administered medications for this visit.    Facility-Administered Medications Ordered in Other Visits  Medication Dose Route Frequency Provider Last Rate Last Dose  . sodium chloride 0.9 % injection 10 mL  10 mL Intravenous PRN [name], MD   10 mL at 05/23/14 1635  . sodium chloride 0.9 % injection 10 mL  10 mL Intravenous PRN [name], MD   10 mL at 05/18/15 1255    SURGICAL HISTORY:  Past Surgical History:  Procedure Laterality Date  . IR GENERIC HISTORICAL  02/13/2016   IR REMOVAL TUN ACCESS W/ PORT W/O FL MOD SED 02/13/2016 Greggory Keen, MD [facility]INTER[facility]  . NO PAST SURGERIES    . Sinus artery surgery     not surgery  . VIDEO BRONCHOSCOPY N/A 07/04/2014   Procedure: VIDEO BRONCHOSCOPY WITH  ENDOBRONCIAL BIOPSY;  Surgeon: [name], MD;  Location: [facility];  Service: Thoracic;  Laterality: N/A;  . VIDEO BRONCHOSCOPY WITH ENDOBRONCHIAL NAVIGATION N/A 01/18/2014   Procedure: VIDEO BRONCHOSCOPY WITH ENDOBRONCHIAL NAVIGATION;  Surgeon:  [name], MD;  Location: [facility];  Service: Thoracic;  Laterality: N/A;  . VIDEO BRONCHOSCOPY WITH ENDOBRONCHIAL ULTRASOUND N/A 01/18/2014   Procedure: VIDEO BRONCHOSCOPY WITH ENDOBRONCHIAL ULTRASOUND;  Surgeon: [name], MD;  Location: [facility];  Service: Thoracic;  Laterality: N/A;    REVIEW OF SYSTEMS:  A comprehensive review of systems was negative.   PHYSICAL EXAMINATION: General appearance: alert, cooperative and no distress Head: Normocephalic, without obvious abnormality, atraumatic Neck: no adenopathy, no JVD, supple, symmetrical, trachea midline and thyroid not enlarged, symmetric, no tenderness/mass/nodules Lymph nodes: Cervical, supraclavicular, and axillary nodes normal. Resp: clear to auscultation bilaterally Back: symmetric, no curvature. ROM normal. No CVA tenderness. Cardio: regular rate and rhythm, S1, S2 normal, no murmur, click, rub or gallop GI: soft, non-tender; bowel sounds normal; no masses,  no organomegaly Extremities: extremities normal, atraumatic, no cyanosis or edema  ECOG PERFORMANCE STATUS: 1 - Symptomatic but completely ambulatory  Blood pressure 138/80, pulse 96, temperature 98.2 F (36.8 C), temperature source Oral, resp. rate 18, height 5\' 5"  (1.651 m), weight 122 lb 4.8 oz (55.5 kg), SpO2 96 %.  LABORATORY DATA: Lab Results  Component Value Date   WBC 4.2 01/30/2017   HGB 13.3 01/30/2017   HCT 39.7 01/30/2017   MCV 96.0 01/30/2017   PLT 125 (L) 01/30/2017      Chemistry      Component Value Date/Time   NA 141 01/30/2017 1011   K 3.9 01/30/2017 1011   CL 102 06/21/2016 2146   CO2 31 (H) 01/30/2017 1011   BUN 20.3 01/30/2017 1011   CREATININE 1.1 01/30/2017 1011      Component Value Date/Time   CALCIUM 9.7 01/30/2017 1011   ALKPHOS 100 01/30/2017 1011   AST 22 01/30/2017 1011   ALT 15 01/30/2017 1011   BILITOT 0.64 01/30/2017 1011       RADIOGRAPHIC STUDIES: Ct Chest Wo Contrast  Result Date: 01/30/2017 CLINICAL  DATA:  Right lung cancer restaging. EXAM: CT CHEST WITHOUT CONTRAST TECHNIQUE: Multidetector CT imaging of the chest was performed following the standard protocol without IV contrast. COMPARISON:  08/01/2016 FINDINGS: Cardiovascular: Normal heart size. No pericardial effusion. Aortic atherosclerosis. Calcification within the left circumflex coronary artery noted. Mediastinum/Nodes: The trachea appears patent. Normal appearance of the esophagus. No mediastinal or hilar adenopathy. No axillary or supraclavicular adenopathy. Lungs/Pleura: No pleural effusion. Moderate changes of centrilobular emphysema. Partially calcified irregular subpleural nodular opacity in the right lung apex is unchanged measuring 2.5 by 1.6 cm, image 11 of series 7. Superior segment of right lower lobe lung nodule and is stable measuring 8 mm, image 63 of series 7. No new or progressive findings identified. Upper Abdomen: Bilateral renal lesions are again noted including several small hyperdense cortical lesions arising from the upper pole the left  kidney. These are incompletely characterized without IV contrast. The adrenal glands appear unremarkable. No acute abnormality noted within the upper abdomen. Musculoskeletal: There is degenerative disc disease identified within the thoracic spine. IMPRESSION: 1. Stable appearance of treated neoplasm in the superior segment of right lower lobe. Mild radiation fibrosis in the right midlung is unchanged. 2. Aortic Atherosclerosis (ICD10-I70.0) and Emphysema (ICD10-J43.9). Electronically Signed   By: Kerby Moors M.D.   On: 01/30/2017 13:07    ASSESSMENT AND PLAN:  This is a very pleasant 83 years old white male with limited stage small cell lung cancer and completed 6 cycles of systemic chemotherapy with cisplatin and etoposide concurrent with radiation. He had almost complete response to this treatment and has been observation since December 2015. The patient is currently on observation and  doing fine. His repeat CT scan of the chest showed no evidence for disease recurrence or metastasis. I discussed the scan results with the patient and his daughter today. I recommended for him to continue on observation with repeat CT scan of the chest in 6 months. He was advised to call immediately if he has any concerning symptoms in the interval. The patient voices understanding of current disease status and treatment options and is in agreement with the current care plan. All questions were answered. The patient knows to call the clinic with any problems, questions or concerns. We can certainly see the patient much sooner if necessary. I spent 10 minutes counseling the patient face to face. The total time spent in the appointment was 15 minutes. Disclaimer: This note was dictated with voice recognition software. Similar sounding words can inadvertently be transcribed and may not be corrected upon review.

## 2017-02-02 NOTE — Telephone Encounter (Signed)
Gave avs and calendar for march

## 2017-02-02 NOTE — Addendum Note (Signed)
Addended by: Ardeen Garland on: 02/02/2017 11:58 AM   Modules accepted: Orders

## 2017-02-04 DIAGNOSIS — D72819 Decreased white blood cell count, unspecified: Secondary | ICD-10-CM | POA: Diagnosis not present

## 2017-02-04 DIAGNOSIS — Z0001 Encounter for general adult medical examination with abnormal findings: Secondary | ICD-10-CM | POA: Diagnosis not present

## 2017-02-04 DIAGNOSIS — Z23 Encounter for immunization: Secondary | ICD-10-CM | POA: Diagnosis not present

## 2017-05-30 ENCOUNTER — Encounter (HOSPITAL_COMMUNITY): Payer: Self-pay

## 2017-05-30 ENCOUNTER — Other Ambulatory Visit: Payer: Self-pay

## 2017-05-30 ENCOUNTER — Emergency Department (HOSPITAL_COMMUNITY): Payer: Medicare Other

## 2017-05-30 ENCOUNTER — Inpatient Hospital Stay (HOSPITAL_COMMUNITY)
Admission: EM | Admit: 2017-05-30 | Discharge: 2017-06-26 | DRG: 870 | Disposition: E | Payer: Medicare Other | Attending: Internal Medicine | Admitting: Internal Medicine

## 2017-05-30 DIAGNOSIS — D638 Anemia in other chronic diseases classified elsewhere: Secondary | ICD-10-CM | POA: Diagnosis present

## 2017-05-30 DIAGNOSIS — R739 Hyperglycemia, unspecified: Secondary | ICD-10-CM | POA: Diagnosis not present

## 2017-05-30 DIAGNOSIS — Z79899 Other long term (current) drug therapy: Secondary | ICD-10-CM

## 2017-05-30 DIAGNOSIS — E870 Hyperosmolality and hypernatremia: Secondary | ICD-10-CM | POA: Diagnosis present

## 2017-05-30 DIAGNOSIS — Z7982 Long term (current) use of aspirin: Secondary | ICD-10-CM

## 2017-05-30 DIAGNOSIS — E440 Moderate protein-calorie malnutrition: Secondary | ICD-10-CM | POA: Diagnosis present

## 2017-05-30 DIAGNOSIS — R0902 Hypoxemia: Secondary | ICD-10-CM | POA: Diagnosis not present

## 2017-05-30 DIAGNOSIS — R609 Edema, unspecified: Secondary | ICD-10-CM | POA: Diagnosis not present

## 2017-05-30 DIAGNOSIS — C3491 Malignant neoplasm of unspecified part of right bronchus or lung: Secondary | ICD-10-CM | POA: Diagnosis not present

## 2017-05-30 DIAGNOSIS — J90 Pleural effusion, not elsewhere classified: Secondary | ICD-10-CM

## 2017-05-30 DIAGNOSIS — I10 Essential (primary) hypertension: Secondary | ICD-10-CM | POA: Diagnosis present

## 2017-05-30 DIAGNOSIS — J439 Emphysema, unspecified: Secondary | ICD-10-CM | POA: Diagnosis present

## 2017-05-30 DIAGNOSIS — A408 Other streptococcal sepsis: Secondary | ICD-10-CM | POA: Diagnosis not present

## 2017-05-30 DIAGNOSIS — Z8672 Personal history of thrombophlebitis: Secondary | ICD-10-CM

## 2017-05-30 DIAGNOSIS — I4891 Unspecified atrial fibrillation: Secondary | ICD-10-CM | POA: Diagnosis not present

## 2017-05-30 DIAGNOSIS — J13 Pneumonia due to Streptococcus pneumoniae: Secondary | ICD-10-CM | POA: Diagnosis not present

## 2017-05-30 DIAGNOSIS — E860 Dehydration: Secondary | ICD-10-CM | POA: Diagnosis present

## 2017-05-30 DIAGNOSIS — N179 Acute kidney failure, unspecified: Secondary | ICD-10-CM | POA: Diagnosis present

## 2017-05-30 DIAGNOSIS — G92 Toxic encephalopathy: Secondary | ICD-10-CM | POA: Diagnosis present

## 2017-05-30 DIAGNOSIS — J918 Pleural effusion in other conditions classified elsewhere: Secondary | ICD-10-CM | POA: Diagnosis not present

## 2017-05-30 DIAGNOSIS — R0602 Shortness of breath: Secondary | ICD-10-CM

## 2017-05-30 DIAGNOSIS — R6521 Severe sepsis with septic shock: Secondary | ICD-10-CM | POA: Diagnosis not present

## 2017-05-30 DIAGNOSIS — Z978 Presence of other specified devices: Secondary | ICD-10-CM

## 2017-05-30 DIAGNOSIS — N400 Enlarged prostate without lower urinary tract symptoms: Secondary | ICD-10-CM | POA: Diagnosis present

## 2017-05-30 DIAGNOSIS — F0390 Unspecified dementia without behavioral disturbance: Secondary | ICD-10-CM | POA: Diagnosis present

## 2017-05-30 DIAGNOSIS — R9431 Abnormal electrocardiogram [ECG] [EKG]: Secondary | ICD-10-CM | POA: Diagnosis not present

## 2017-05-30 DIAGNOSIS — B953 Streptococcus pneumoniae as the cause of diseases classified elsewhere: Secondary | ICD-10-CM | POA: Diagnosis not present

## 2017-05-30 DIAGNOSIS — A419 Sepsis, unspecified organism: Secondary | ICD-10-CM | POA: Diagnosis not present

## 2017-05-30 DIAGNOSIS — J9600 Acute respiratory failure, unspecified whether with hypoxia or hypercapnia: Secondary | ICD-10-CM

## 2017-05-30 DIAGNOSIS — J189 Pneumonia, unspecified organism: Secondary | ICD-10-CM | POA: Diagnosis present

## 2017-05-30 DIAGNOSIS — Z681 Body mass index (BMI) 19 or less, adult: Secondary | ICD-10-CM | POA: Diagnosis not present

## 2017-05-30 DIAGNOSIS — E878 Other disorders of electrolyte and fluid balance, not elsewhere classified: Secondary | ICD-10-CM | POA: Diagnosis present

## 2017-05-30 DIAGNOSIS — Z87442 Personal history of urinary calculi: Secondary | ICD-10-CM

## 2017-05-30 DIAGNOSIS — Z9221 Personal history of antineoplastic chemotherapy: Secondary | ICD-10-CM

## 2017-05-30 DIAGNOSIS — J9601 Acute respiratory failure with hypoxia: Secondary | ICD-10-CM

## 2017-05-30 DIAGNOSIS — J80 Acute respiratory distress syndrome: Secondary | ICD-10-CM | POA: Diagnosis present

## 2017-05-30 DIAGNOSIS — Z515 Encounter for palliative care: Secondary | ICD-10-CM | POA: Diagnosis present

## 2017-05-30 DIAGNOSIS — E875 Hyperkalemia: Secondary | ICD-10-CM | POA: Diagnosis not present

## 2017-05-30 DIAGNOSIS — I4510 Unspecified right bundle-branch block: Secondary | ICD-10-CM | POA: Diagnosis not present

## 2017-05-30 DIAGNOSIS — Z923 Personal history of irradiation: Secondary | ICD-10-CM

## 2017-05-30 DIAGNOSIS — Z887 Allergy status to serum and vaccine status: Secondary | ICD-10-CM

## 2017-05-30 DIAGNOSIS — E785 Hyperlipidemia, unspecified: Secondary | ICD-10-CM | POA: Diagnosis present

## 2017-05-30 DIAGNOSIS — J181 Lobar pneumonia, unspecified organism: Secondary | ICD-10-CM

## 2017-05-30 DIAGNOSIS — J810 Acute pulmonary edema: Secondary | ICD-10-CM | POA: Diagnosis not present

## 2017-05-30 DIAGNOSIS — R0603 Acute respiratory distress: Secondary | ICD-10-CM | POA: Diagnosis not present

## 2017-05-30 DIAGNOSIS — K5900 Constipation, unspecified: Secondary | ICD-10-CM | POA: Diagnosis not present

## 2017-05-30 DIAGNOSIS — J984 Other disorders of lung: Secondary | ICD-10-CM | POA: Diagnosis not present

## 2017-05-30 DIAGNOSIS — J9690 Respiratory failure, unspecified, unspecified whether with hypoxia or hypercapnia: Secondary | ICD-10-CM | POA: Diagnosis not present

## 2017-05-30 DIAGNOSIS — Z4682 Encounter for fitting and adjustment of non-vascular catheter: Secondary | ICD-10-CM | POA: Diagnosis not present

## 2017-05-30 DIAGNOSIS — Z87891 Personal history of nicotine dependence: Secondary | ICD-10-CM

## 2017-05-30 DIAGNOSIS — Z85118 Personal history of other malignant neoplasm of bronchus and lung: Secondary | ICD-10-CM

## 2017-05-30 DIAGNOSIS — I471 Supraventricular tachycardia: Secondary | ICD-10-CM | POA: Diagnosis not present

## 2017-05-30 DIAGNOSIS — R05 Cough: Secondary | ICD-10-CM | POA: Diagnosis not present

## 2017-05-30 DIAGNOSIS — E876 Hypokalemia: Secondary | ICD-10-CM | POA: Diagnosis present

## 2017-05-30 DIAGNOSIS — Z91041 Radiographic dye allergy status: Secondary | ICD-10-CM

## 2017-05-30 DIAGNOSIS — Z8249 Family history of ischemic heart disease and other diseases of the circulatory system: Secondary | ICD-10-CM

## 2017-05-30 DIAGNOSIS — R4020 Unspecified coma: Secondary | ICD-10-CM | POA: Diagnosis not present

## 2017-05-30 LAB — BLOOD GAS, ARTERIAL
Acid-base deficit: 3.4 mmol/L — ABNORMAL HIGH (ref 0.0–2.0)
BICARBONATE: 22.1 mmol/L (ref 20.0–28.0)
Drawn by: 103701
FIO2: 100
O2 Saturation: 96.5 %
PCO2 ART: 48.6 mmHg — AB (ref 32.0–48.0)
PH ART: 7.292 — AB (ref 7.350–7.450)
PO2 ART: 114 mmHg — AB (ref 83.0–108.0)
Patient temperature: 102

## 2017-05-30 LAB — BASIC METABOLIC PANEL
Anion gap: 14 (ref 5–15)
BUN: 39 mg/dL — AB (ref 6–20)
CO2: 24 mmol/L (ref 22–32)
Calcium: 9.1 mg/dL (ref 8.9–10.3)
Chloride: 102 mmol/L (ref 101–111)
Creatinine, Ser: 1.97 mg/dL — ABNORMAL HIGH (ref 0.61–1.24)
GFR calc Af Amer: 35 mL/min — ABNORMAL LOW (ref >60)
GFR, EST NON AFRICAN AMERICAN: 30 mL/min — AB (ref >60)
GLUCOSE: 104 mg/dL — AB (ref 65–99)
POTASSIUM: 4 mmol/L (ref 3.5–5.1)
Sodium: 140 mmol/L (ref 135–145)

## 2017-05-30 LAB — RESPIRATORY PANEL BY PCR
Adenovirus: NOT DETECTED
BORDETELLA PERTUSSIS-RVPCR: NOT DETECTED
CORONAVIRUS HKU1-RVPPCR: NOT DETECTED
CORONAVIRUS NL63-RVPPCR: NOT DETECTED
Chlamydophila pneumoniae: NOT DETECTED
Coronavirus 229E: NOT DETECTED
Coronavirus OC43: NOT DETECTED
Influenza A: NOT DETECTED
Influenza B: NOT DETECTED
METAPNEUMOVIRUS-RVPPCR: NOT DETECTED
Mycoplasma pneumoniae: NOT DETECTED
PARAINFLUENZA VIRUS 2-RVPPCR: NOT DETECTED
PARAINFLUENZA VIRUS 3-RVPPCR: NOT DETECTED
Parainfluenza Virus 1: NOT DETECTED
Parainfluenza Virus 4: NOT DETECTED
RHINOVIRUS / ENTEROVIRUS - RVPPCR: NOT DETECTED
Respiratory Syncytial Virus: NOT DETECTED

## 2017-05-30 LAB — URINALYSIS, ROUTINE W REFLEX MICROSCOPIC
Bilirubin Urine: NEGATIVE
Glucose, UA: 50 mg/dL — AB
KETONES UR: NEGATIVE mg/dL
Leukocytes, UA: NEGATIVE
Nitrite: NEGATIVE
PH: 5 (ref 5.0–8.0)
PROTEIN: 100 mg/dL — AB
Specific Gravity, Urine: 1.017 (ref 1.005–1.030)

## 2017-05-30 LAB — LACTIC ACID, PLASMA: Lactic Acid, Venous: 2.2 mmol/L (ref 0.5–1.9)

## 2017-05-30 LAB — CBG MONITORING, ED: GLUCOSE-CAPILLARY: 75 mg/dL (ref 65–99)

## 2017-05-30 LAB — CBC
HEMATOCRIT: 43.4 % (ref 39.0–52.0)
Hemoglobin: 14.6 g/dL (ref 13.0–17.0)
MCH: 33.1 pg (ref 26.0–34.0)
MCHC: 33.6 g/dL (ref 30.0–36.0)
MCV: 98.4 fL (ref 78.0–100.0)
Platelets: 136 10*3/uL — ABNORMAL LOW (ref 150–400)
RBC: 4.41 MIL/uL (ref 4.22–5.81)
RDW: 14.3 % (ref 11.5–15.5)
WBC: 11 10*3/uL — ABNORMAL HIGH (ref 4.0–10.5)

## 2017-05-30 LAB — I-STAT CG4 LACTIC ACID, ED
LACTIC ACID, VENOUS: 6.02 mmol/L — AB (ref 0.5–1.9)
Lactic Acid, Venous: 3.95 mmol/L (ref 0.5–1.9)

## 2017-05-30 MED ORDER — HYDRALAZINE HCL 20 MG/ML IJ SOLN
10.0000 mg | Freq: Four times a day (QID) | INTRAMUSCULAR | Status: DC | PRN
  Administered 2017-06-03: 10 mg via INTRAVENOUS
  Filled 2017-05-30 (×2): qty 1

## 2017-05-30 MED ORDER — SODIUM CHLORIDE 0.9 % IV BOLUS (SEPSIS)
250.0000 mL | Freq: Once | INTRAVENOUS | Status: AC
  Administered 2017-05-30: 250 mL via INTRAVENOUS

## 2017-05-30 MED ORDER — DEXTROSE 5 % IV SOLN: 1.0000 g | Freq: Once | INTRAVENOUS | Status: DC

## 2017-05-30 MED ORDER — GUAIFENESIN ER 600 MG PO TB12
600.0000 mg | ORAL_TABLET | Freq: Two times a day (BID) | ORAL | Status: DC
  Administered 2017-05-30 – 2017-06-01 (×4): 600 mg via ORAL
  Filled 2017-05-30 (×4): qty 1

## 2017-05-30 MED ORDER — SODIUM CHLORIDE 0.9% FLUSH: 3.0000 mL | INTRAVENOUS | Status: DC | PRN

## 2017-05-30 MED ORDER — IPRATROPIUM-ALBUTEROL 0.5-2.5 (3) MG/3ML IN SOLN
RESPIRATORY_TRACT | Status: AC
  Administered 2017-05-30: 3 mL via RESPIRATORY_TRACT
  Filled 2017-05-30: qty 3

## 2017-05-30 MED ORDER — DEXTROSE 5 % IV SOLN
1.0000 g | INTRAVENOUS | Status: DC
  Administered 2017-05-30: 1 g via INTRAVENOUS
  Filled 2017-05-30 (×2): qty 10

## 2017-05-30 MED ORDER — SODIUM CHLORIDE 0.9 % IV BOLUS (SEPSIS)
1000.0000 mL | Freq: Once | INTRAVENOUS | Status: AC
  Administered 2017-05-30: 1000 mL via INTRAVENOUS

## 2017-05-30 MED ORDER — ONDANSETRON HCL 4 MG PO TABS: 4.0000 mg | ORAL_TABLET | Freq: Four times a day (QID) | ORAL | Status: DC | PRN

## 2017-05-30 MED ORDER — SODIUM CHLORIDE 0.9 % IV SOLN
INTRAVENOUS | Status: DC
  Administered 2017-05-31: 05:00:00 via INTRAVENOUS

## 2017-05-30 MED ORDER — ATORVASTATIN CALCIUM 40 MG PO TABS
40.0000 mg | ORAL_TABLET | Freq: Every day | ORAL | Status: DC
  Administered 2017-05-30 – 2017-06-01 (×3): 40 mg via ORAL
  Filled 2017-05-30 (×3): qty 1

## 2017-05-30 MED ORDER — ACETAMINOPHEN 325 MG PO TABS: 650.0000 mg | ORAL_TABLET | Freq: Four times a day (QID) | ORAL | Status: DC | PRN

## 2017-05-30 MED ORDER — TRAZODONE HCL 50 MG PO TABS
50.0000 mg | ORAL_TABLET | Freq: Every evening | ORAL | Status: DC | PRN
  Administered 2017-05-31 – 2017-06-01 (×2): 50 mg via ORAL
  Filled 2017-05-30 (×2): qty 1

## 2017-05-30 MED ORDER — TRAMADOL HCL 50 MG PO TABS
50.0000 mg | ORAL_TABLET | Freq: Four times a day (QID) | ORAL | Status: DC | PRN
  Administered 2017-06-01: 50 mg via ORAL
  Filled 2017-05-30 (×2): qty 1

## 2017-05-30 MED ORDER — ONDANSETRON HCL 4 MG/2ML IJ SOLN: 4.0000 mg | Freq: Four times a day (QID) | INTRAMUSCULAR | Status: DC | PRN

## 2017-05-30 MED ORDER — ALFUZOSIN HCL ER 10 MG PO TB24
10.0000 mg | ORAL_TABLET | Freq: Every day | ORAL | Status: DC
  Administered 2017-05-30 – 2017-06-01 (×3): 10 mg via ORAL
  Filled 2017-05-30 (×4): qty 1

## 2017-05-30 MED ORDER — SENNA 8.6 MG PO TABS
1.0000 | ORAL_TABLET | Freq: Two times a day (BID) | ORAL | Status: DC
  Administered 2017-05-30 – 2017-06-01 (×4): 8.6 mg via ORAL
  Filled 2017-05-30 (×4): qty 1

## 2017-05-30 MED ORDER — SODIUM CHLORIDE 0.9 % IV SOLN
250.0000 mL | INTRAVENOUS | Status: DC | PRN
  Administered 2017-06-03 – 2017-06-08 (×2): 250 mL via INTRAVENOUS

## 2017-05-30 MED ORDER — DEXTROSE 5 % IV SOLN
500.0000 mg | INTRAVENOUS | Status: DC
  Administered 2017-05-30 – 2017-06-02 (×4): 500 mg via INTRAVENOUS
  Filled 2017-05-30 (×4): qty 500

## 2017-05-30 MED ORDER — ALPRAZOLAM 0.25 MG PO TABS
0.2500 mg | ORAL_TABLET | Freq: Every evening | ORAL | Status: DC | PRN
  Administered 2017-05-30 – 2017-06-01 (×2): 0.25 mg via ORAL
  Filled 2017-05-30 (×2): qty 1

## 2017-05-30 MED ORDER — SODIUM CHLORIDE 0.9 % IV BOLUS (SEPSIS)
500.0000 mL | Freq: Once | INTRAVENOUS | Status: AC
  Administered 2017-05-30: 500 mL via INTRAVENOUS

## 2017-05-30 MED ORDER — ALBUTEROL SULFATE (2.5 MG/3ML) 0.083% IN NEBU
2.5000 mg | INHALATION_SOLUTION | RESPIRATORY_TRACT | Status: DC | PRN
  Administered 2017-06-01: 2.5 mg via RESPIRATORY_TRACT
  Filled 2017-05-30: qty 3

## 2017-05-30 MED ORDER — POLYETHYLENE GLYCOL 3350 17 G PO PACK
17.0000 g | PACK | Freq: Every day | ORAL | Status: DC | PRN
  Administered 2017-06-13: 17 g via ORAL
  Filled 2017-05-30: qty 1

## 2017-05-30 MED ORDER — HEPARIN SODIUM (PORCINE) 5000 UNIT/ML IJ SOLN
5000.0000 [IU] | Freq: Three times a day (TID) | INTRAMUSCULAR | Status: DC
  Administered 2017-05-30 – 2017-06-14 (×43): 5000 [IU] via SUBCUTANEOUS
  Filled 2017-05-30 (×41): qty 1

## 2017-05-30 MED ORDER — ACETAMINOPHEN 325 MG PO TABS
650.0000 mg | ORAL_TABLET | Freq: Once | ORAL | Status: AC
  Administered 2017-05-30: 650 mg via ORAL
  Filled 2017-05-30: qty 2

## 2017-05-30 MED ORDER — DEXTROSE 5 % IV SOLN: 500.0000 mg | Freq: Once | INTRAVENOUS | Status: DC

## 2017-05-30 MED ORDER — SODIUM CHLORIDE 0.9% FLUSH
3.0000 mL | Freq: Two times a day (BID) | INTRAVENOUS | Status: DC
  Administered 2017-05-30 – 2017-06-06 (×11): 3 mL via INTRAVENOUS
  Administered 2017-06-08: 10 mL via INTRAVENOUS
  Administered 2017-06-08 – 2017-06-12 (×2): 3 mL via INTRAVENOUS

## 2017-05-30 MED ORDER — IPRATROPIUM-ALBUTEROL 0.5-2.5 (3) MG/3ML IN SOLN
3.0000 mL | Freq: Four times a day (QID) | RESPIRATORY_TRACT | Status: DC
  Administered 2017-05-30 – 2017-05-31 (×3): 3 mL via RESPIRATORY_TRACT
  Filled 2017-05-30: qty 3

## 2017-05-30 MED ORDER — ASPIRIN EC 81 MG PO TBEC
81.0000 mg | DELAYED_RELEASE_TABLET | Freq: Every day | ORAL | Status: DC
  Administered 2017-05-30 – 2017-06-01 (×3): 81 mg via ORAL
  Filled 2017-05-30 (×3): qty 1

## 2017-05-30 MED ORDER — ACETAMINOPHEN 650 MG RE SUPP: 650.0000 mg | Freq: Four times a day (QID) | RECTAL | Status: DC | PRN

## 2017-05-30 NOTE — Progress Notes (Signed)
CRITICAL VALUE ALERT  Critical Value:  Lactic 2.2 & Troponin 0.04  Date & Time Notied:  06/15/2017 2210  Provider Notified: Silas Sacramento   Orders Received/Actions taken:   Rosine Beat, RN

## 2017-05-30 NOTE — H&P (Signed)
Patient Demographics:    Patrick Carr, is a 84 y.o. male  MRN: [id number]   DOB - February 16, 1934  Admit Date - 06/16/2017  Outpatient Primary MD for the patient is Deland Pretty, MD   Assessment & Plan:    Principal Problem:   Sepsis due to Pulm Source Active Problems:   Hypertension   Small cell carcinoma of right lung ([facility])   Community acquired pneumonia   Hypoxic Respiratory failure, acute ([place])   CAP (community acquired pneumonia)    1)Sepsis secondary to pulmonary source- he has cough, shortness of breath, and chest x-ray suggested right-sided pneumonia, patient has shortness of breath, new onset hypoxia with O2 sats in the high 70s on RA, fever up to 101.2, white count of 11 K, tachypnea with respiratory rate in the 30s, and tachycardia with heart rate of 130s,  lactic acid elevated at 6, repeat lactic acid after IV fluids 3.95, treat empirically with Rocephin/azithromycin pending blood cultures, bronchodilators, mucolytics, supplemental oxygen.  And IV fluids   2)Acute hypoxic respiratory failure-secondary to #1 above treat as above in # 1 and give  supplemental oxygen  3)H/o Rt Lung SCC-last chemotherapy was December 2015, apparently patient has been in remission, get follow-up chest imaging after treatment for acute pneumonia  4)Possible Syncope-suspect patient went down due to significant tachycardia, tachypnea and hypoxia, was found on the floor, the patient denies actually falling, chest pains, CT is unremarkable, echo pending, troponin ordered and pending.  EKG was sinus tach heart rate 130s, right bundle branch block with PVCs  5)AKI-creatinine is up to 1.97 from a baseline of 1.1, hold losartan, hydrate IV n.p.o., avoid nephrotoxic agents  With History of - Reviewed by me  Past Medical History:    Diagnosis Date  . BPH (benign prostatic hyperplasia)   . Cancer ([facility])    Lung  . Hernia, inguinal left   . History of blood transfusion    artery in sinus bleed  . History of kidney stones   . Hyperlipidemia   . Hypertension   . Irregular heart rate   . Lesion of right lung    RLL  . Phlebitis 03/20/14   bilateral forearms  . Radiation 02/08/14-03/21/14   Stage III lung cancer 30 fractions  . RBBB    no palpations  . Renal cysts, acquired, bilateral       Past Surgical History:  Procedure Laterality Date  . IR GENERIC HISTORICAL  02/13/2016   IR REMOVAL TUN ACCESS W/ PORT W/O FL MOD SED 02/13/2016 Greggory Keen, MD [facility]INTER[facility] RAD  . NO PAST SURGERIES    . Sinus artery surgery     not surgery  . VIDEO BRONCHOSCOPY N/A 07/04/2014   Procedure: VIDEO BRONCHOSCOPY WITH  ENDOBRONCIAL BIOPSY;  Surgeon: [name], MD;  Location: [facility];  Service: Thoracic;  Laterality: N/A;  . VIDEO BRONCHOSCOPY WITH ENDOBRONCHIAL NAVIGATION N/A 01/18/2014   Procedure: VIDEO BRONCHOSCOPY WITH ENDOBRONCHIAL NAVIGATION;  Surgeon: [name], MD;  Location: [facility];  Service: Thoracic;  Laterality: N/A;  . VIDEO BRONCHOSCOPY WITH ENDOBRONCHIAL ULTRASOUND N/A 01/18/2014   Procedure: VIDEO BRONCHOSCOPY WITH ENDOBRONCHIAL ULTRASOUND;  Surgeon: [name], MD;  Location: [facility];  Service: Thoracic;  Laterality: N/A;      Chief Complaint  Patient presents with  . Loss of Consciousness      HPI:    Patrick Carr  is a 84 y.o. male with past medical history relevant for history of right-sided small cell lung cancer status post chemo last chemo session was December 2015 and patient was told he was in remission presents today with a 3-4-week history of cough, shortness of breath, malaise, fatigue and fevers.   Was apparently found on the floor, unwitnessed how he got to the floor patient denies any fall or head injury, he denies chest pain.   In ED-CT head without acute findings, chest  x-ray suggestive of right-sided pneumonia,  Was found to have  new onset hypoxia with O2 sats in the high 70s on RA, fever up to 101.2, white count of 11 K, tachypnea with respiratory rate in the 30s, and tachycardia with heart rate of 130s,  lactic acid elevated at 6, repeat lactic acid after IV fluids was 3.95  She is wife, daughter and granddaughter at bedside, patient was not on home O2 prior to arrival   Review of systems:    In addition to the HPI above,   A full 12 point Review of 10 Systems was done, except as stated above, all other Review of 10 Systems were negative.    Social History:  Reviewed by me    Social History   Tobacco Use  . Smoking status: Former Smoker    Packs/day: 0.50    Years: 62.00    Pack years: 31.00    Last attempt to quit: 01/29/2014    Years since quitting: 3.3  . Smokeless tobacco: Never Used  Substance Use Topics  . Alcohol use: No       Family History :  Reviewed by me    Family History  Problem Relation Age of Onset  . Heart disease Mother   . Cancer Father      Home Medications:   Prior to Admission medications   Medication Sig Start Date End Date Taking? Authorizing Provider  alfuzosin (UROXATRAL) 10 MG 24 hr tablet Take 10 mg by mouth at bedtime.    Yes [provider]  ALPRAZolam (XANAX) 0.25 MG tablet Take 1 tablet (0.25 mg total) by mouth at bedtime as needed for anxiety. 08/23/15  Yes [name], MD  aspirin 81 MG tablet Take 81 mg by mouth at bedtime.    Yes [provider]  atorvastatin (LIPITOR) 40 MG tablet Take 40 mg by mouth at bedtime.    Yes [provider]  losartan (COZAAR) 100 MG tablet Take 100 mg by mouth at bedtime.  11/18/15  Yes [provider]     Allergies:     Allergies  Allergen Reactions  . Contrast Media [Iodinated Diagnostic Agents]   . Other Other (See Comments)    Pneumonia vaccine causes arm swelling, caused redness  . Pneumococcal Vaccines     Arm  swelling     Physical Exam:   Vitals  Blood pressure 103/69, pulse (!) 116, temperature (!) 97.4 F (36.3 C), temperature source Oral, resp. rate (!) 29, height 5\' 5"  (1.651 m), weight 54.4 kg (120 lb),  SpO2 94 %.  Physical Examination: General appearance - alert, cachectic appearing, and in no distress  Mental status - alert, oriented to person, place, and time,  Nose- [place]  3 L/min Eyes - sclera anicteric Neck - supple, no JVD elevation , Chest -diminished on the right with scattered rales and rhonchi,  heart - S1 and S2 normal, heart rate 118 Abdomen - soft, nontender, nondistended, no masses or organomegaly Neurological - screening mental status exam normal, neck supple without rigidity, cranial nerves II through XII intact, DTR's normal and symmetric Extremities - no pedal edema noted, intact peripheral pulses  Skin - warm, dry Psych-affect is appropriate Neuro-no new focal neuro deficits   Data Review:    CBC Recent Labs  Lab 06/11/2017 1453  WBC 11.0*  HGB 14.6  HCT 43.4  PLT 136*  MCV 98.4  MCH 33.1  MCHC 33.6  RDW 14.3   ------------------------------------------------------------------------------------------------------------------  Chemistries  Recent Labs  Lab 05/26/2017 1453  NA 140  K 4.0  CL 102  CO2 24  GLUCOSE 104*  BUN 39*  CREATININE 1.97*  CALCIUM 9.1   ------------------------------------------------------------------------------------------------------------------ estimated creatinine clearance is 22.6 mL/min (A) (by C-G formula based on SCr of 1.97 mg/dL (H)). ------------------------------------------------------------------------------------------------------------------ No results for input(s): TSH, T4TOTAL, T3FREE, THYROIDAB in the last 72 hours.  Invalid input(s): FREET3   Coagulation profile No results for input(s): INR, PROTIME in the last 168  hours. ------------------------------------------------------------------------------------------------------------------- No results for input(s): DDIMER in the last 72 hours. -------------------------------------------------------------------------------------------------------------------  Cardiac Enzymes No results for input(s): CKMB, TROPONINI, MYOGLOBIN in the last 168 hours.  Invalid input(s): CK ------------------------------------------------------------------------------------------------------------------ No results found for: BNP   ---------------------------------------------------------------------------------------------------------------  Urinalysis    Component Value Date/Time   COLORURINE AMBER (A) 07/03/2014 1036   APPEARANCEUR CLEAR 07/03/2014 1036   LABSPEC 1.020 07/03/2014 1036   PHURINE 5.0 07/03/2014 1036   GLUCOSEU NEGATIVE 07/03/2014 1036   HGBUR MODERATE (A) 07/03/2014 1036   BILIRUBINUR SMALL (A) 07/03/2014 1036   KETONESUR NEGATIVE 07/03/2014 1036   PROTEINUR 30 (A) 07/03/2014 1036   UROBILINOGEN 0.2 07/03/2014 1036   NITRITE NEGATIVE 07/03/2014 1036   LEUKOCYTESUR NEGATIVE 07/03/2014 1036    ----------------------------------------------------------------------------------------------------------------   Imaging Results:    Dg Chest 2 View  Result Date: 06/25/2017 CLINICAL DATA:  Cough and fever for several days. EXAM: CHEST  2 VIEW COMPARISON:  01/30/2017 chest CT and prior studies FINDINGS: Airspace disease throughout the right lower lobe is compatible with pneumonia. Upper limits normal heart size noted. Fullness of the right hilar region is grossly unchanged. No pleural effusion or pneumothorax. Right apical pleuroparenchymal opacities/scarring again noted. IMPRESSION: Diffuse right lower lobe airspace disease compatible with pneumonia. Radiographic follow-up to resolution is recommended. Electronically Signed   By: Margarette Canada M.D.   On:  06/24/2017 16:03   Ct Head Wo Contrast  Result Date: 06/20/2017 CLINICAL DATA:  Altered level of consciousness/unresponsive EXAM: CT HEAD WITHOUT CONTRAST TECHNIQUE: Contiguous axial images were obtained from the base of the skull through the vertex without intravenous contrast. COMPARISON:  MRI brain dated 01/13/2014 FINDINGS: Brain: No evidence of acute infarction, hemorrhage, hydrocephalus, extra-axial collection or mass lesion/mass effect. Mild cortical atrophy. Subcortical white matter and periventricular small vessel ischemic changes. Vascular: Mild intracranial atherosclerosis. Skull: Normal. Negative for fracture or focal lesion. Sinuses/Orbits: The visualized paranasal sinuses are essentially clear. The mastoid air cells are unopacified. Other: None. IMPRESSION: No evidence of acute intracranial abnormality. Mild atrophy with small vessel ischemic changes. Electronically Signed   By: Bertis Ruddy  [place] Pink M.D.   On: 06/06/2017 15:37    Radiological Exams on Admission: Dg Chest 2 View  Result Date: 06/17/2017 CLINICAL DATA:  Cough and fever for several days. EXAM: CHEST  2 VIEW COMPARISON:  01/30/2017 chest CT and prior studies FINDINGS: Airspace disease throughout the right lower lobe is compatible with pneumonia. Upper limits normal heart size noted. Fullness of the right hilar region is grossly unchanged. No pleural effusion or pneumothorax. Right apical pleuroparenchymal opacities/scarring again noted. IMPRESSION: Diffuse right lower lobe airspace disease compatible with pneumonia. Radiographic follow-up to resolution is recommended. Electronically Signed   By: Margarette Canada M.D.   On: 06/06/2017 16:03   Ct Head Wo Contrast  Result Date: 06/08/2017 CLINICAL DATA:  Altered level of consciousness/unresponsive EXAM: CT HEAD WITHOUT CONTRAST TECHNIQUE: Contiguous axial images were obtained from the base of the skull through the vertex without intravenous contrast. COMPARISON:  MRI brain dated 01/13/2014  FINDINGS: Brain: No evidence of acute infarction, hemorrhage, hydrocephalus, extra-axial collection or mass lesion/mass effect. Mild cortical atrophy. Subcortical white matter and periventricular small vessel ischemic changes. Vascular: Mild intracranial atherosclerosis. Skull: Normal. Negative for fracture or focal lesion. Sinuses/Orbits: The visualized paranasal sinuses are essentially clear. The mastoid air cells are unopacified. Other: None. IMPRESSION: No evidence of acute intracranial abnormality. Mild atrophy with small vessel ischemic changes. Electronically Signed   By: Julian Hy M.D.   On: 05/29/2017 15:37    DVT Prophylaxis -SCD/heparin AM Labs Ordered, also please review Full Orders  Family Communication: Admission, patients condition and plan of care including tests being ordered have been discussed with the patient and daughter and wife who indicate understanding and agree with the plan   Code Status - Full Code  Likely DC to  home  Condition   fair  Roxan Hockey M.D on 06/22/2017 at 6:09 PM   Between 7am to 7pm - Pager - [phone] After 7pm go to [email or web address] - password [email or web address]  [facility] - Office  [phone]  Voice Recognition Viviann Spare dictation system was used to create this note, attempts have been made to correct errors. Please contact the author with questions and/or clarifications.

## 2017-05-30 NOTE — ED Provider Notes (Signed)
[facility] DEPT Provider Note   CSN: [id number] Arrival date & time: 06/25/2017  1421     History   Chief Complaint Chief Complaint  Patient presents with  . Loss of Consciousness    HPI Patrick Carr is a 84 y.o. male.  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 History obtained through daughter who is at bedside. Patient is at home with his wife. Her daughter who was away on a trip to [place], mom told her that patient has been feeling weak, and having coughing for the past week. Today he was found laying in the floor of the bathroom and unresponsive.  Wife was able to arouse him, but he did not want to come to the hospital.  Daughter came over and brought patient here for evaluation.  Triage noted that pt was hypoxic with O2 sats 76-80% on RA. He's not on any home O2.  Pt however, denies having increasing SOB or having any pain.  Daughter mentioned initially pt was c/o pain to the base of his neck near shoulder blades.  Pt did not c/o headache, neck pain, cp, sob, abd pain, back or hip pain.  He denies fever or chills, and sts his appetite has been the same.   Past Medical History:  Diagnosis Date  . BPH (benign prostatic hyperplasia)   . Cancer ([place])    Lung  . Hernia, inguinal left   . History of blood transfusion    artery in sinus bleed  . History of kidney stones   . Hyperlipidemia   . Hypertension   . Irregular heart rate   . Lesion of right lung    RLL  . Phlebitis 03/20/14   bilateral forearms  . Radiation 02/08/14-03/21/14   Stage III lung cancer 30 fractions  . RBBB    no palpations  . Renal cysts, acquired, bilateral     Patient Active Problem List   Diagnosis Date Noted  . Port catheter in place 10/12/2015  . Phlebitis 03/16/2014  . Small cell carcinoma of right lung ([place])  01/26/2014  . Hypertension   . Hyperlipidemia   . BPH (benign prostatic hyperplasia)   . RBBB   . Renal cysts, acquired, bilateral   . Lesion of right lung   . Lung nodule 01/03/2014    Past Surgical History:  Procedure Laterality Date  . IR GENERIC HISTORICAL  02/13/2016   IR REMOVAL TUN ACCESS W/ PORT W/O FL MOD SED 02/13/2016 Greggory Keen, MD [facility]-INTERV RAD  . NO PAST SURGERIES    . Sinus artery surgery     not surgery  . VIDEO BRONCHOSCOPY N/A 07/04/2014   Procedure: VIDEO BRONCHOSCOPY WITH  ENDOBRONCIAL BIOPSY;  Surgeon: [name], MD;  Location: [facility];  Service: Thoracic;  Laterality: N/A;  . VIDEO BRONCHOSCOPY WITH ENDOBRONCHIAL NAVIGATION N/A 01/18/2014   Procedure: VIDEO BRONCHOSCOPY WITH ENDOBRONCHIAL NAVIGATION;  Surgeon: [name], MD;  Location: [facility];  Service: Thoracic;  Laterality: N/A;  . VIDEO BRONCHOSCOPY WITH ENDOBRONCHIAL ULTRASOUND N/A 01/18/2014   Procedure: VIDEO BRONCHOSCOPY WITH ENDOBRONCHIAL ULTRASOUND;  Surgeon: [name], MD;  Location: [facility];  Service: Thoracic;  Laterality: N/A;       Home Medications    Prior to Admission medications   Medication Sig Start Date End Date Taking? Authorizing Provider  alfuzosin (UROXATRAL) 10  MG 24 hr tablet Take 10 mg by mouth at bedtime.     [provider]  ALPRAZolam Duanne Moron) 0.25 MG tablet Take 1 tablet (0.25 mg total) by mouth at bedtime as needed for anxiety. 08/23/15   [name], MD  aspirin 81 MG tablet Take 81 mg by mouth at bedtime.     [provider]  atorvastatin (LIPITOR) 40 MG tablet Take 40 mg by mouth at bedtime.     [provider]  diphenhydrAMINE (BENADRYL) 25 MG tablet Take 1 tablet (25 mg total) by mouth every 6 (six) hours as needed for itching (Rash). Patient not taking: Reported on 02/02/2017 08/11/14   Noemi Chapel, MD  lidocaine-prilocaine (EMLA) cream Apply 1 application topically as needed. Apply over port site 1 hour prior to chemo. Do  not rub in medicine. Patient not taking: Reported on 02/02/2017 08/23/15   [name], MD  losartan (COZAAR) 100 MG tablet Take 100 mg by mouth at bedtime.  11/18/15   [provider]    Family History Family History  Problem Relation Age of Onset  . Heart disease Mother   . Cancer Father     Social History Social History   Tobacco Use  . Smoking status: Former Smoker    Packs/day: 0.50    Years: 62.00    Pack years: 31.00    Last attempt to quit: 01/29/2014    Years since quitting: 3.3  . Smokeless tobacco: Never Used  Substance Use Topics  . Alcohol use: No  . Drug use: No     Allergies   Contrast media [iodinated diagnostic agents]; Other; and Pneumococcal vaccines   Review of Systems Review of Systems  All other systems reviewed and are negative.    Physical Exam Updated Vital Signs BP 102/60 (BP Location: Left Arm)   Pulse 69   Resp (!) 23   Ht 5\' 5"  (1.651 m)   Wt 54.4 kg (120 lb)   SpO2 (!) 76%   BMI 19.97 kg/m   Physical Exam  Constitutional: He is oriented to person, place, and time. No distress.  Chronically ill appearing elderly male not in respiratory distress.  HENT:  Head: Atraumatic.  Mouth: poorly fitting denture, mouth is dry  Eyes: Conjunctivae are normal.  Neck: Normal range of motion. Neck supple.  No cervical midline spine tenderness.  No tenderness to trapezius or paracervical muscles.   Cardiovascular:  Tachycardia with irregular heart rhythm   Pulmonary/Chest:  Poor effort, crackles heard at RLL  Abdominal: Soft. He exhibits no distension. There is no tenderness.  Neurological: He is alert and oriented to person, place, and time.  Moving all 4 extremities without focal weakness.   Skin: Skin is warm. No rash noted.  Psychiatric: He has a normal mood and affect.  Nursing note and vitals reviewed.    ED Treatments / Results  Labs (all labs ordered are listed, but only abnormal results are displayed) Labs  Reviewed  BASIC METABOLIC PANEL - Abnormal; Notable for the following components:      Result Value   Glucose, Bld 104 (*)    BUN 39 (*)    Creatinine, Ser 1.97 (*)    GFR calc non Af Amer 30 (*)    GFR calc Af Amer 35 (*)    All other components within normal limits  CBC - Abnormal; Notable for the following components:   WBC 11.0 (*)    Platelets 136 (*)    All other components  within normal limits  BLOOD GAS, ARTERIAL - Abnormal; Notable for the following components:   pH, Arterial 7.292 (*)    pCO2 arterial 48.6 (*)    pO2, Arterial 114 (*)    Acid-base deficit 3.4 (*)    All other components within normal limits  I-STAT CG4 LACTIC ACID, ED - Abnormal; Notable for the following components:   Lactic Acid, Venous 6.02 (*)    All other components within normal limits  CULTURE, BLOOD (ROUTINE X 2)  CULTURE, BLOOD (ROUTINE X 2)  RESPIRATORY PANEL BY PCR  URINALYSIS, ROUTINE W REFLEX MICROSCOPIC  CBG MONITORING, ED  I-STAT CG4 LACTIC ACID, ED    EKG  EKG Interpretation  Date/Time:  Saturday May 30 2017 14:26:25 EST Ventricular Rate:  136 PR Interval:  148 QRS Duration: 116 QT Interval:  326 QTC Calculation: 490 R Axis:   41 Text Interpretation:  Sinus tachycardia with frequent Premature ventricular complexes Right bundle branch block Abnormal ECG Since last tracing rate faster Confirmed by Dorie Rank [phone]) on 06/25/2017 2:35:22 PM       Radiology Dg Chest 2 View  Result Date: 06/15/2017 CLINICAL DATA:  Cough and fever for several days. EXAM: CHEST  2 VIEW COMPARISON:  01/30/2017 chest CT and prior studies FINDINGS: Airspace disease throughout the right lower lobe is compatible with pneumonia. Upper limits normal heart size noted. Fullness of the right hilar region is grossly unchanged. No pleural effusion or pneumothorax. Right apical pleuroparenchymal opacities/scarring again noted. IMPRESSION: Diffuse right lower lobe airspace disease compatible with pneumonia.  Radiographic follow-up to resolution is recommended. Electronically Signed   By: Margarette Canada M.D.   On: 06/10/2017 16:03   Ct Head Wo Contrast  Result Date: 06/19/2017 CLINICAL DATA:  Altered level of consciousness/unresponsive EXAM: CT HEAD WITHOUT CONTRAST TECHNIQUE: Contiguous axial images were obtained from the base of the skull through the vertex without intravenous contrast. COMPARISON:  MRI brain dated 01/13/2014 FINDINGS: Brain: No evidence of acute infarction, hemorrhage, hydrocephalus, extra-axial collection or mass lesion/mass effect. Mild cortical atrophy. Subcortical white matter and periventricular small vessel ischemic changes. Vascular: Mild intracranial atherosclerosis. Skull: Normal. Negative for fracture or focal lesion. Sinuses/Orbits: The visualized paranasal sinuses are essentially clear. The mastoid air cells are unopacified. Other: None. IMPRESSION: No evidence of acute intracranial abnormality. Mild atrophy with small vessel ischemic changes. Electronically Signed   By: Julian Hy M.D.   On: 06/01/2017 15:37    Procedures Procedures (including critical care time)  Medications Ordered in ED Medications  azithromycin (ZITHROMAX) 500 mg in dextrose 5 % 250 mL IVPB (0 mg Intravenous Stopped 06/04/2017 1657)  cefTRIAXone (ROCEPHIN) 1 g in dextrose 5 % 50 mL IVPB (0 g Intravenous Stopped 05/29/2017 1657)  sodium chloride 0.9 % bolus 1,000 mL (0 mLs Intravenous Stopped 06/24/2017 1547)    And  sodium chloride 0.9 % bolus 500 mL (0 mLs Intravenous Stopped 06/07/2017 1547)    And  sodium chloride 0.9 % bolus 250 mL (0 mLs Intravenous Stopped 06/17/2017 1547)  acetaminophen (TYLENOL) tablet 650 mg (650 mg Oral Given 06/19/2017 1547)     Initial Impression / Assessment and Plan / ED Course  I have reviewed the triage vital signs and the nursing notes.  Pertinent labs & imaging results that were available during my care of the patient were reviewed by me and considered in my medical decision  making (see chart for details).     BP 101/69   Pulse (!) 118   Temp (!)  97.4 F (36.3 C) (Oral)   Resp (!) 25   Ht 5\' 5"  (1.651 m)   Wt 54.4 kg (120 lb)   SpO2 100%   BMI 19.97 kg/m    Final Clinical Impressions(s) / ED Diagnoses   Final diagnoses:  Community acquired pneumonia of right lower lobe of lung ([place])  Hypoxia    ED Discharge Orders    None     3:08 PM Pt have been coughing, here with fever of 101.2, and hypoxia.  Patient has crackles on his right lower lung base concerning for pneumonia. Workup initiated. Code Sepsis initiated.  5:04 PM Elevated temperature 101.2, Tylenol given and it has improved to attention of 97.4. ABGs show evidence of respiratory acidosis with a pH of 7.929 and a  that she may have a CO2 of 40.6. Elevated lactic acid of 6.02. Chest x-ray demonstrate diffuse right lower lobe space disease compatible with pneumonia. Patient was started on antibiotic to treat for community acquired pneumonia including Rocephin and Zithromax. Aggressive fluid resuscitation at 30 mL/kg. Care discussed with Dr. Tomi Bamberger  Appreciate consultation from [facility] hospitalist Dr. Merian Capron who agrees to see and admit pt for further management of pt's CAP with new oxygen requirement.    CRITICAL CARE Performed by: Domenic Moras Total critical care time: 45 minutes Critical care time was exclusive of separately billable procedures and treating other patients. Critical care was necessary to treat or prevent imminent or life-threatening deterioration. Critical care was time spent personally by me on the following activities: development of treatment plan with patient and/or surrogate as well as nursing, discussions with consultants, evaluation of patient's response to treatment, examination of patient, obtaining history from patient or surrogate, ordering and performing treatments and interventions, ordering and review of laboratory studies, ordering and review of radiographic studies,  pulse oximetry and re-evaluation of patient's condition.    Domenic Moras, PA-C 06/21/2017 1707    Dorie Rank, MD 05/31/17 [phone]

## 2017-05-30 NOTE — ED Notes (Addendum)
Attempted to call report, at shift change, floor can not take report.

## 2017-05-30 NOTE — ED Notes (Signed)
MD AND RN NOTIFIED OF PATIENT'S LACTIC ACID LEVEL OF 6.02

## 2017-05-30 NOTE — ED Notes (Signed)
MD AND RN NOTIFIED OF PATIENT'S LACTIC ACID LEVEL OF 3.95

## 2017-05-30 NOTE — ED Notes (Signed)
Patient desat to 80% on 3L [place], Oxygen increased and MD aware. Ordered placed for non rebreather. Pt 96%.

## 2017-05-30 NOTE — ED Provider Notes (Signed)
Pt presents to the ED for evaluation of shortness of breath and cough.  Family found him unresponsive.  Pt is awake and alert here while on supplemental oxygen.  Pt has significant ronchi on exam.    Initial labs show elevated lactic acid level.  Pt is not hypotensive but PNA, sepsis is a concern.  Will start on abx, fluids, proceed with evaluation.  Anticipate hospitalization.   EKG Interpretation  Date/Time:  Saturday May 30 2017 14:26:25 EST Ventricular Rate:  136 PR Interval:  148 QRS Duration: 116 QT Interval:  326 QTC Calculation: 490 R Axis:   41 Text Interpretation:  Sinus tachycardia with frequent Premature ventricular complexes Right bundle branch block Abnormal ECG Since last tracing rate faster Confirmed by Dorie Rank [phone]) on 06/22/2017 2:35:22 PM Also confirmed by Dorie Rank [phone]), editor Laurena Spies [phone])  on 06/05/2017 3:10:49 PM      Medical screening examination/treatment/procedure(s) were conducted as a shared visit with non-physician practitioner(s) and myself.  I personally evaluated the patient during the encounter.     Dorie Rank, MD 06/23/2017 [phone]

## 2017-05-30 NOTE — ED Triage Notes (Signed)
PER THE DAUGHTER, HER MOM FOUND HIM UNRESPONSIVE IN THE BATHROOM AT 0400 TODAY. PT HAS A HX OF LUNG CA WITH TX ENDING 4 YRS AGO. IN TRIAGE O2 SAT 76-80% ON 4L. PT STS HE USES NO O2 AT HOME. PT C/O PAIN BETWEEN THE SHOULDER BLADES, BUT DENIES HITTING HIS HEAD. [id number].

## 2017-05-30 NOTE — ED Notes (Signed)
RT aware of ABG.

## 2017-05-31 ENCOUNTER — Inpatient Hospital Stay (HOSPITAL_COMMUNITY): Payer: Medicare Other

## 2017-05-31 DIAGNOSIS — R9431 Abnormal electrocardiogram [ECG] [EKG]: Secondary | ICD-10-CM

## 2017-05-31 LAB — BLOOD CULTURE ID PANEL (REFLEXED)
ACINETOBACTER BAUMANNII: NOT DETECTED
CANDIDA ALBICANS: NOT DETECTED
CANDIDA GLABRATA: NOT DETECTED
CANDIDA PARAPSILOSIS: NOT DETECTED
CANDIDA TROPICALIS: NOT DETECTED
Candida krusei: NOT DETECTED
ENTEROBACTERIACEAE SPECIES: NOT DETECTED
Enterobacter cloacae complex: NOT DETECTED
Enterococcus species: NOT DETECTED
Escherichia coli: NOT DETECTED
HAEMOPHILUS INFLUENZAE: NOT DETECTED
KLEBSIELLA OXYTOCA: NOT DETECTED
KLEBSIELLA PNEUMONIAE: NOT DETECTED
Listeria monocytogenes: NOT DETECTED
NEISSERIA MENINGITIDIS: NOT DETECTED
PROTEUS SPECIES: NOT DETECTED
Pseudomonas aeruginosa: NOT DETECTED
STAPHYLOCOCCUS SPECIES: NOT DETECTED
STREPTOCOCCUS SPECIES: DETECTED — AB
Serratia marcescens: NOT DETECTED
Staphylococcus aureus (BCID): NOT DETECTED
Streptococcus agalactiae: NOT DETECTED
Streptococcus pneumoniae: DETECTED — AB
Streptococcus pyogenes: NOT DETECTED

## 2017-05-31 LAB — BLOOD GAS, ARTERIAL
ACID-BASE DEFICIT: 1.6 mmol/L (ref 0.0–2.0)
Bicarbonate: 23.3 mmol/L (ref 20.0–28.0)
Drawn by: 317771
O2 CONTENT: 10 L/min
O2 SAT: 87.7 %
Patient temperature: 37
pCO2 arterial: 42.7 mmHg (ref 32.0–48.0)
pH, Arterial: 7.356 (ref 7.350–7.450)
pO2, Arterial: 57.5 mmHg — ABNORMAL LOW (ref 83.0–108.0)

## 2017-05-31 LAB — ECHOCARDIOGRAM COMPLETE
HEIGHTINCHES: 65 in
Weight: 1788.37 oz

## 2017-05-31 LAB — BASIC METABOLIC PANEL
ANION GAP: 8 (ref 5–15)
BUN: 44 mg/dL — AB (ref 6–20)
CHLORIDE: 110 mmol/L (ref 101–111)
CO2: 23 mmol/L (ref 22–32)
Calcium: 7.9 mg/dL — ABNORMAL LOW (ref 8.9–10.3)
Creatinine, Ser: 1.37 mg/dL — ABNORMAL HIGH (ref 0.61–1.24)
GFR calc non Af Amer: 47 mL/min — ABNORMAL LOW (ref >60)
GFR, EST AFRICAN AMERICAN: 54 mL/min — AB (ref >60)
Glucose, Bld: 101 mg/dL — ABNORMAL HIGH (ref 65–99)
POTASSIUM: 3.7 mmol/L (ref 3.5–5.1)
SODIUM: 141 mmol/L (ref 135–145)

## 2017-05-31 LAB — TROPONIN I
TROPONIN I: 0.04 ng/mL — AB (ref <0.03)
Troponin I: 0.03 ng/mL (ref <0.03)

## 2017-05-31 LAB — CBC
HEMATOCRIT: 32.4 % — AB (ref 39.0–52.0)
HEMOGLOBIN: 10.8 g/dL — AB (ref 13.0–17.0)
MCH: 32.5 pg (ref 26.0–34.0)
MCHC: 33.3 g/dL (ref 30.0–36.0)
MCV: 97.6 fL (ref 78.0–100.0)
Platelets: 97 10*3/uL — ABNORMAL LOW (ref 150–400)
RBC: 3.32 MIL/uL — AB (ref 4.22–5.81)
RDW: 14.4 % (ref 11.5–15.5)
WBC: 5.9 10*3/uL (ref 4.0–10.5)

## 2017-05-31 LAB — LACTIC ACID, PLASMA: Lactic Acid, Venous: 1.7 mmol/L (ref 0.5–1.9)

## 2017-05-31 LAB — MRSA PCR SCREENING: MRSA BY PCR: NEGATIVE

## 2017-05-31 MED ORDER — IPRATROPIUM-ALBUTEROL 0.5-2.5 (3) MG/3ML IN SOLN
3.0000 mL | Freq: Three times a day (TID) | RESPIRATORY_TRACT | Status: DC
  Administered 2017-05-31 – 2017-06-01 (×4): 3 mL via RESPIRATORY_TRACT
  Filled 2017-05-31 (×4): qty 3

## 2017-05-31 MED ORDER — HALOPERIDOL LACTATE 5 MG/ML IJ SOLN
3.0000 mg | Freq: Once | INTRAMUSCULAR | Status: AC
  Administered 2017-05-31: 3 mg via INTRAVENOUS
  Filled 2017-05-31: qty 1

## 2017-05-31 MED ORDER — DEXTROSE 5 % IV SOLN
2.0000 g | INTRAVENOUS | Status: DC
  Administered 2017-05-31 – 2017-06-06 (×6): 2 g via INTRAVENOUS
  Filled 2017-05-31 (×7): qty 2

## 2017-05-31 MED ORDER — SODIUM CHLORIDE 0.9 % IV BOLUS (SEPSIS)
1000.0000 mL | Freq: Once | INTRAVENOUS | Status: AC
  Administered 2017-05-31: 1000 mL via INTRAVENOUS

## 2017-05-31 MED ORDER — METOPROLOL TARTRATE 5 MG/5ML IV SOLN
5.0000 mg | Freq: Once | INTRAVENOUS | Status: AC
  Administered 2017-05-31: 5 mg via INTRAVENOUS
  Filled 2017-05-31: qty 5

## 2017-05-31 MED ORDER — DILTIAZEM HCL 100 MG IV SOLR
5.0000 mg/h | INTRAVENOUS | Status: DC
  Administered 2017-05-31: 5 mg/h via INTRAVENOUS
  Administered 2017-06-01 – 2017-06-02 (×2): 10 mg/h via INTRAVENOUS
  Administered 2017-06-02: 7.5 mg/h via INTRAVENOUS
  Filled 2017-05-31 (×4): qty 100

## 2017-05-31 MED ORDER — SODIUM CHLORIDE 0.9 % IV BOLUS (SEPSIS)
500.0000 mL | Freq: Once | INTRAVENOUS | Status: AC
  Administered 2017-05-31: 500 mL via INTRAVENOUS

## 2017-05-31 MED ORDER — LORAZEPAM 2 MG/ML IJ SOLN
0.5000 mg | Freq: Once | INTRAMUSCULAR | Status: AC
  Administered 2017-05-31: 0.5 mg via INTRAVENOUS
  Filled 2017-05-31: qty 1

## 2017-05-31 MED ORDER — LORAZEPAM 2 MG/ML IJ SOLN
1.0000 mg | Freq: Once | INTRAMUSCULAR | Status: AC
  Administered 2017-05-31: 1 mg via INTRAVENOUS
  Filled 2017-05-31: qty 1

## 2017-05-31 NOTE — Progress Notes (Addendum)
Pt's O2 satuaration = 85 on 4Lnc. MD made aware. Stat CHX and ABG ordered.   1500: pt complaining of chest pain. Cannot describe pain, MD paged. 1530: pat asleep, when writer awaked him he denied any chest pain. Echo being performed, will continue to monitor closely

## 2017-05-31 NOTE — Progress Notes (Signed)
Patient Demographics:    Patrick Carr, is a 84 y.o. male, DOB - 05/13/1934, AFB:[id number]  Admit date - 06/04/2017   Admitting Physician Deshan Hemmelgarn Denton Brick, MD  Outpatient Primary MD for the patient is Deland Pretty, MD  LOS - 1   Chief Complaint  Patient presents with  . Loss of Consciousness        Subjective:    [name] today has no fevers, no emesis, cough and shortness of breath persists, more sleepy after Ativan, wife and daughter at bedside, questions answered, no diarrhea  Assessment  & Plan :    Principal Problem:   Sepsis due to Pulm Source Active Problems:   Hypertension   Small cell carcinoma of right lung ([place])   Community acquired pneumonia   Hypoxic Respiratory failure, acute ([place])   CAP (community acquired pneumonia)  Brief Summary:- 84 year old male with past medical history relevant for prior history of right lung small cell carcinoma status post chemotherapy in 2015 admitted on 06/24/2017 with sepsis secondary to pulmonary source/right-sided pneumonia with effusion.  On 05/31/2017 patient was noted to have new onset A. fib with RVR in the setting of sepsis and hypoxia, blood cultures from 06/09/2017 with strep pneumo  Plan:-  1)Strep Pneumonia Bacteremia Bacteremia and Sepsis secondary to Pulmonary source- cough and shortness of breath persist, repeat chest x-ray on 05/31/17 with right-sided pneumonia with large pleural effusion,  hypoxia persis white count is down to 5.9 from 11k,     lactic acid elevated at 6 on admission, c/n bronchodilators, mucolytics, supplemental oxygen. C/n IV fluids, IV Rocephin (Abx started 05/30/16) increased to 2 g every 24 hours, continue azithromycin 500 daily.   2)New Onset Afib with RVR- in the setting of sepsis and hypoxia, start IV Cardizem drip at 5 mg/h, echocardiogram pending, Troponin has been borderline, check TSH  3)Acute hypoxic  respiratory failure-secondary to #1 and # 2 above treat as above in # 1 and # 2, c/n  supplemental oxygen  4)H/o Rt Lung SCC-last chemotherapy was December 2015, apparently patient has been in remission, ??? Need for Rt sided Thoracentensis, Rx acute pneumonia  5)Possible Syncope-suspect patient went down due to significant tachycardia, tachypnea and hypoxia, was found on the floor, the patient denies actually falling,   CT Head is unremarkable, echo pending, troponin is borderline,   EKG Now with Afib with RVR,   6)AKI-creatinine is down to 1.3 from 1.97 from a baseline of 1.1, c/n to hold losartan, hydrate IV n.p.o., avoid nephrotoxic agents  7)Social/Ethics-plan of care and advanced directives discussed with patient, his wife and daughter,, patient is a full code  Code Status : Full   Disposition Plan  : TBD  Consults  :  none   DVT Prophylaxis  :   Heparin  Lab Results  Component Value Date   PLT 97 (L) 05/31/2017    Inpatient Medications  Scheduled Meds: . alfuzosin  10 mg Oral QHS  . aspirin EC  81 mg Oral QHS  . atorvastatin  40 mg Oral QHS  . guaiFENesin  600 mg Oral BID  . heparin  5,000 Units Subcutaneous Q8H  . ipratropium-albuterol  3 mL Nebulization TID  . senna  1 tablet Oral BID  . sodium chloride flush  3 mL Intravenous Q12H   Continuous Infusions: . sodium chloride    . sodium chloride 125 mL/hr at 05/31/17 0500  . azithromycin 500 mg (05/31/17 1400)  . cefTRIAXone (ROCEPHIN)  IV    . diltiazem (CARDIZEM) infusion     PRN Meds:.sodium chloride, acetaminophen **OR** acetaminophen, albuterol, ALPRAZolam, hydrALAZINE, ondansetron **OR** ondansetron (ZOFRAN) IV, polyethylene glycol, sodium chloride flush, traMADol, traZODone    Anti-infectives (From admission, onward)   Start     Dose/Rate Route Frequency Ordered Stop   05/31/17 1600  cefTRIAXone (ROCEPHIN) 2 g in dextrose 5 % 50 mL IVPB     2 g 100 mL/hr over 30 Minutes Intravenous Every 24 hours  05/31/17 1304     06/01/2017 1600  azithromycin (ZITHROMAX) 500 mg in dextrose 5 % 250 mL IVPB     500 mg 250 mL/hr over 60 Minutes Intravenous Every 24 hours 06/05/2017 1508     06/21/2017 1600  cefTRIAXone (ROCEPHIN) 1 g in dextrose 5 % 50 mL IVPB  Status:  Discontinued     1 g 100 mL/hr over 30 Minutes Intravenous Every 24 hours 06/12/2017 1508 05/31/17 1304   06/24/2017 1515  cefTRIAXone (ROCEPHIN) 1 g in dextrose 5 % 50 mL IVPB  Status:  Discontinued     1 g 100 mL/hr over 30 Minutes Intravenous  Once 06/11/2017 1501 06/23/2017 1508   05/27/2017 1515  azithromycin (ZITHROMAX) 500 mg in dextrose 5 % 250 mL IVPB  Status:  Discontinued     500 mg 250 mL/hr over 60 Minutes Intravenous  Once 06/13/2017 1501 05/26/2017 1508        Objective:   Vitals:   05/31/17 0538 05/31/17 1347 05/31/17 1424 05/31/17 1526  BP: 111/60 117/60  100/61  Pulse: (!) 113 61  (!) 119  Resp: 20 20  19   Temp: 97.9 F (36.6 C) 98 F (36.7 C)    TempSrc: Axillary Oral    SpO2: 92% (!) 85% 90% 93%  Weight:      Height:        Wt Readings from Last 3 Encounters:  06/02/2017 50.7 kg (111 lb 12.4 oz)  02/02/17 55.5 kg (122 lb 4.8 oz)  08/04/16 56.4 kg (124 lb 4 oz)     Intake/Output Summary (Last 24 hours) at 05/31/2017 1629 Last data filed at 05/31/2017 1408 Gross per 24 hour  Intake 1048 ml  Output 550 ml  Net 498 ml     Physical Exam  Gen:- Awake , was sleepy after Ativan HEENT:- [place].AT, No sclera icterus Neck-Supple Neck,No JVD,.  Lungs-  Diminished especially  on the right with scattered rhonchi rales CV- S1, S2 normal, irregularly irregular with heart rate in the 130s3 Abd-  +ve B.Sounds, Abd Soft, No tenderness,    Extremity/Skin:- No  edema,    Psych-sleepy on and off after Ativan Neuro-generalized weakness without new focal deficits   Data Review:   Micro Results Recent Results (from the past 240 hour(s))  Blood culture (routine x 2)     Status: None (Preliminary result)   Collection Time: 05/29/2017   2:53 PM  Result Value Ref Range Status   Specimen Description BLOOD RIGHT FOREARM  Final   Special Requests   Final    BOTTLES DRAWN AEROBIC AND ANAEROBIC Blood Culture adequate volume   Culture  Setup Time   Final    GRAM POSITIVE COCCI AEROBIC BOTTLE ONLY CRITICAL RESULT CALLED TO, READ BACK BY AND VERIFIED WITH: E WILLIAMSON,PHARMD AT 1232 05/31/17 BY  L BENFIELD Performed at [facility], [place] [address] [place], [place] [address]    Culture GRAM POSITIVE COCCI  Final   Report Status PENDING  Incomplete  Blood Culture ID Panel (Reflexed)     Status: Abnormal   Collection Time: 06/01/2017  2:53 PM  Result Value Ref Range Status   Enterococcus species NOT DETECTED NOT DETECTED Final   Listeria monocytogenes NOT DETECTED NOT DETECTED Final   Staphylococcus species NOT DETECTED NOT DETECTED Final   Staphylococcus aureus NOT DETECTED NOT DETECTED Final   Streptococcus species DETECTED (A) NOT DETECTED Final    Comment: CRITICAL RESULT CALLED TO, READ BACK BY AND VERIFIED WITH: E WILLIAMSON,PHARMD AT 1232 05/31/17 BY L BENFIELD    Streptococcus agalactiae NOT DETECTED NOT DETECTED Final   Streptococcus pneumoniae DETECTED (A) NOT DETECTED Final    Comment: CRITICAL RESULT CALLED TO, READ BACK BY AND VERIFIED WITH: E WILLIAMSON,PHARMD AT 1232 05/31/17 BY L BENFIELD    Streptococcus pyogenes NOT DETECTED NOT DETECTED Final   Acinetobacter baumannii NOT DETECTED NOT DETECTED Final   Enterobacteriaceae species NOT DETECTED NOT DETECTED Final   Enterobacter cloacae complex NOT DETECTED NOT DETECTED Final   Escherichia coli NOT DETECTED NOT DETECTED Final   Klebsiella oxytoca NOT DETECTED NOT DETECTED Final   Klebsiella pneumoniae NOT DETECTED NOT DETECTED Final   Proteus species NOT DETECTED NOT DETECTED Final   Serratia marcescens NOT DETECTED NOT DETECTED Final   Haemophilus influenzae NOT DETECTED NOT DETECTED Final   Neisseria meningitidis NOT DETECTED NOT DETECTED Final    Pseudomonas aeruginosa NOT DETECTED NOT DETECTED Final   Candida albicans NOT DETECTED NOT DETECTED Final   Candida glabrata NOT DETECTED NOT DETECTED Final   Candida krusei NOT DETECTED NOT DETECTED Final   Candida parapsilosis NOT DETECTED NOT DETECTED Final   Candida tropicalis NOT DETECTED NOT DETECTED Final    Comment: Performed at [facility], [place] [address], [place], [place] [address]  Blood culture (routine x 2)     Status: None (Preliminary result)   Collection Time: 06/15/2017  3:01 PM  Result Value Ref Range Status   Specimen Description BLOOD LEFT ANTECUBITAL  Final   Special Requests   Final    BOTTLES DRAWN AEROBIC AND ANAEROBIC Blood Culture adequate volume   Culture  Setup Time   Final    GRAM POSITIVE COCCI AEROBIC BOTTLE ONLY CRITICAL VALUE NOTED.  VALUE IS CONSISTENT WITH PREVIOUSLY REPORTED AND CALLED VALUE. Performed at [facility], [place] [address] [place],  [address]    Culture GRAM POSITIVE COCCI  Final   Report Status PENDING  Incomplete  Respiratory Panel by PCR     Status: None   Collection Time: 06/24/2017  4:05 PM  Result Value Ref Range Status   Adenovirus NOT DETECTED NOT DETECTED Final   Coronavirus 229E NOT DETECTED NOT DETECTED Final   Coronavirus HKU1 NOT DETECTED NOT DETECTED Final   Coronavirus NL63 NOT DETECTED NOT DETECTED Final   Coronavirus OC43 NOT DETECTED NOT DETECTED Final   Metapneumovirus NOT DETECTED NOT DETECTED Final   Rhinovirus / Enterovirus NOT DETECTED NOT DETECTED Final   Influenza A NOT DETECTED NOT DETECTED Final   Influenza B NOT DETECTED NOT DETECTED Final   Parainfluenza Virus 1 NOT DETECTED NOT DETECTED Final   Parainfluenza Virus 2 NOT DETECTED NOT DETECTED Final   Parainfluenza Virus 3 NOT DETECTED NOT DETECTED Final   Parainfluenza Virus 4 NOT DETECTED NOT DETECTED Final  Respiratory Syncytial Virus NOT DETECTED NOT DETECTED Final   Bordetella pertussis NOT DETECTED NOT DETECTED Final    Chlamydophila pneumoniae NOT DETECTED NOT DETECTED Final   Mycoplasma pneumoniae NOT DETECTED NOT DETECTED Final    Comment: Performed at [facility], [place] [address] [place], [place] [address]    Radiology Reports Dg Chest 2 View  Result Date: 06/19/2017 CLINICAL DATA:  Cough and fever for several days. EXAM: CHEST  2 VIEW COMPARISON:  01/30/2017 chest CT and prior studies FINDINGS: Airspace disease throughout the right lower lobe is compatible with pneumonia. Upper limits normal heart size noted. Fullness of the right hilar region is grossly unchanged. No pleural effusion or pneumothorax. Right apical pleuroparenchymal opacities/scarring again noted. IMPRESSION: Diffuse right lower lobe airspace disease compatible with pneumonia. Radiographic follow-up to resolution is recommended. Electronically Signed   By: Margarette Canada M.D.   On: 06/04/2017 16:03   Ct Head Wo Contrast  Result Date: 06/24/2017 CLINICAL DATA:  Altered level of consciousness/unresponsive EXAM: CT HEAD WITHOUT CONTRAST TECHNIQUE: Contiguous axial images were obtained from the base of the skull through the vertex without intravenous contrast. COMPARISON:  MRI brain dated 01/13/2014 FINDINGS: Brain: No evidence of acute infarction, hemorrhage, hydrocephalus, extra-axial collection or mass lesion/mass effect. Mild cortical atrophy. Subcortical white matter and periventricular small vessel ischemic changes. Vascular: Mild intracranial atherosclerosis. Skull: Normal. Negative for fracture or focal lesion. Sinuses/Orbits: The visualized paranasal sinuses are essentially clear. The mastoid air cells are unopacified. Other: None. IMPRESSION: No evidence of acute intracranial abnormality. Mild atrophy with small vessel ischemic changes. Electronically Signed   By: Julian Hy M.D.   On: 06/11/2017 15:37   Dg Chest Port 1 View  Result Date: 05/31/2017 CLINICAL DATA:  Shortness of breath today, followup pneumonia EXAM: PORTABLE CHEST  1 VIEW COMPARISON:  Portable exam 1418 hours compared 05/30/2016 FINDINGS: Enlargement of cardiac silhouette. Atherosclerotic calcification aorta. Mediastinal contours and pulmonary vascularity normal. COPD changes with increased infiltrate in the mid to lower RIGHT lung associated with increased RIGHT pleural effusion. Additional in trade in the retrocardiac LEFT lower lobe. Upper lobes clear. No pneumothorax. Bones demineralized. IMPRESSION: Enlargement of cardiac silhouette. Increased atelectasis versus infiltrate in retrocardiac LEFT lower lobe. Increased lower RIGHT lung infiltrate with associated increase in RIGHT pleural effusion. Electronically Signed   By: Lavonia Dana M.D.   On: 05/31/2017 14:36     CBC Recent Labs  Lab 05/26/2017 1453 05/31/17 0606  WBC 11.0* 5.9  HGB 14.6 10.8*  HCT 43.4 32.4*  PLT 136* 97*  MCV 98.4 97.6  MCH 33.1 32.5  MCHC 33.6 33.3  RDW 14.3 14.4    Chemistries  Recent Labs  Lab 06/03/2017 1453 05/31/17 0606  NA 140 141  K 4.0 3.7  CL 102 110  CO2 24 23  GLUCOSE 104* 101*  BUN 39* 44*  CREATININE 1.97* 1.37*  CALCIUM 9.1 7.9*   ------------------------------------------------------------------------------------------------------------------ No results for input(s): CHOL, HDL, LDLCALC, TRIG, CHOLHDL, LDLDIRECT in the last 72 hours.  No results found for: HGBA1C ------------------------------------------------------------------------------------------------------------------ No results for input(s): TSH, T4TOTAL, T3FREE, THYROIDAB in the last 72 hours.  Invalid input(s): FREET3 ------------------------------------------------------------------------------------------------------------------ No results for input(s): VITAMINB12, FOLATE, FERRITIN, TIBC, IRON, RETICCTPCT in the last 72 hours.  Coagulation profile No results for input(s): INR, PROTIME in the last 168 hours.  No results for input(s): DDIMER in the last 72 hours.  Cardiac  Enzymes Recent Labs  Lab 06/18/2017 2111 06/10/2017 2358 05/31/17 0606  TROPONINI 0.04* 0.03* <0.03   ------------------------------------------------------------------------------------------------------------------  No results found for: BNP   Roxan Hockey M.D on 05/31/2017 at 4:29 PM  Between 7am to 7pm - Pager - [phone]  After 7pm go to [email or web address] - password [email or web address]  [facility] -  Office  [phone]   Voice Recognition Viviann Spare dictation system was used to create this note, attempts have been made to correct errors. Please contact the author with questions and/or clarifications.

## 2017-05-31 NOTE — Progress Notes (Signed)
Rapid Response Event Note  Overview:  RRT called at [facility] regarding pt having HR as high as 180 bpm and soft BP.    Initial Focused Assessment: Pt at rest, does not appears distressed. Radial pulse palpated and is erratic and fast. BP 132/77, HR 146, RR 29, O2 98% on 10L HFNC. Cardizem gtt currently running at set rate of 5 mg/hr, IV patent.   Interventions: EKG performed Cardizem gtt titrated up per orders, NP made aware and gtt changed to be titratable  Plan of Care (if not transferred): Pt transferred to SDU for further HR and BP monitoring and for Cardizem gtt titration. Patient family at bedside and updated.   Event Summary:  Patrick Carr

## 2017-05-31 NOTE — Progress Notes (Signed)
  Echocardiogram 2D Echocardiogram has been performed.  Merrie Roof F 05/31/2017, 3:54 PM

## 2017-05-31 NOTE — Progress Notes (Signed)
PHARMACY - PHYSICIAN COMMUNICATION CRITICAL VALUE ALERT - BLOOD CULTURE IDENTIFICATION (BCID)  Results for orders placed or performed during the hospital encounter of 06/15/2017  Blood Culture ID Panel (Reflexed) (Collected: 06/22/2017  2:53 PM)  Result Value Ref Range   Enterococcus species NOT DETECTED NOT DETECTED   Listeria monocytogenes NOT DETECTED NOT DETECTED   Staphylococcus species NOT DETECTED NOT DETECTED   Staphylococcus aureus NOT DETECTED NOT DETECTED   Streptococcus species DETECTED (A) NOT DETECTED   Streptococcus agalactiae NOT DETECTED NOT DETECTED   Streptococcus pneumoniae DETECTED (A) NOT DETECTED   Streptococcus pyogenes NOT DETECTED NOT DETECTED   Acinetobacter baumannii NOT DETECTED NOT DETECTED   Enterobacteriaceae species NOT DETECTED NOT DETECTED   Enterobacter cloacae complex NOT DETECTED NOT DETECTED   Escherichia coli NOT DETECTED NOT DETECTED   Klebsiella oxytoca NOT DETECTED NOT DETECTED   Klebsiella pneumoniae NOT DETECTED NOT DETECTED   Proteus species NOT DETECTED NOT DETECTED   Serratia marcescens NOT DETECTED NOT DETECTED   Haemophilus influenzae NOT DETECTED NOT DETECTED   Neisseria meningitidis NOT DETECTED NOT DETECTED   Pseudomonas aeruginosa NOT DETECTED NOT DETECTED   Candida albicans NOT DETECTED NOT DETECTED   Candida glabrata NOT DETECTED NOT DETECTED   Candida krusei NOT DETECTED NOT DETECTED   Candida parapsilosis NOT DETECTED NOT DETECTED   Candida tropicalis NOT DETECTED NOT DETECTED    Name of physician (or Provider) Contacted: Emokpae  Changes to prescribed antibiotics required: Increase Rocephin to 2g q24. Consider stopping azithro given strep as likely causative pathogen for CAP  Justen Fonda A 05/31/2017  1:06 PM

## 2017-06-01 ENCOUNTER — Encounter (HOSPITAL_COMMUNITY): Payer: Self-pay | Admitting: Cardiology

## 2017-06-01 ENCOUNTER — Inpatient Hospital Stay (HOSPITAL_COMMUNITY): Payer: Medicare Other

## 2017-06-01 DIAGNOSIS — I471 Supraventricular tachycardia: Secondary | ICD-10-CM

## 2017-06-01 LAB — MAGNESIUM: MAGNESIUM: 1.9 mg/dL (ref 1.7–2.4)

## 2017-06-01 LAB — CBC
HCT: 33.1 % — ABNORMAL LOW (ref 39.0–52.0)
HEMOGLOBIN: 10.6 g/dL — AB (ref 13.0–17.0)
MCH: 32.2 pg (ref 26.0–34.0)
MCHC: 32 g/dL (ref 30.0–36.0)
MCV: 100.6 fL — ABNORMAL HIGH (ref 78.0–100.0)
Platelets: 75 10*3/uL — ABNORMAL LOW (ref 150–400)
RBC: 3.29 MIL/uL — AB (ref 4.22–5.81)
RDW: 14.8 % (ref 11.5–15.5)
WBC: 6.4 10*3/uL (ref 4.0–10.5)

## 2017-06-01 LAB — BASIC METABOLIC PANEL
ANION GAP: 6 (ref 5–15)
BUN: 34 mg/dL — AB (ref 6–20)
CHLORIDE: 116 mmol/L — AB (ref 101–111)
CO2: 22 mmol/L (ref 22–32)
Calcium: 7.9 mg/dL — ABNORMAL LOW (ref 8.9–10.3)
Creatinine, Ser: 0.88 mg/dL (ref 0.61–1.24)
GFR calc Af Amer: >60 mL/min (ref >60)
GFR calc non Af Amer: >60 mL/min (ref >60)
Glucose, Bld: 92 mg/dL (ref 65–99)
POTASSIUM: 3.5 mmol/L (ref 3.5–5.1)
SODIUM: 144 mmol/L (ref 135–145)

## 2017-06-01 LAB — TSH: TSH: 1.787 u[IU]/mL (ref 0.350–4.500)

## 2017-06-01 MED ORDER — LORAZEPAM 1 MG PO TABS
1.0000 mg | ORAL_TABLET | Freq: Once | ORAL | Status: AC
  Administered 2017-06-01: 1 mg via ORAL
  Filled 2017-06-01: qty 1

## 2017-06-01 MED ORDER — SODIUM CHLORIDE 0.9 % IV BOLUS (SEPSIS)
1000.0000 mL | Freq: Once | INTRAVENOUS | Status: AC
  Administered 2017-06-01: 1000 mL via INTRAVENOUS

## 2017-06-01 MED ORDER — LEVALBUTEROL HCL 0.63 MG/3ML IN NEBU
0.6300 mg | INHALATION_SOLUTION | Freq: Three times a day (TID) | RESPIRATORY_TRACT | Status: DC
  Administered 2017-06-01 – 2017-06-14 (×39): 0.63 mg via RESPIRATORY_TRACT
  Filled 2017-06-01 (×38): qty 3

## 2017-06-01 NOTE — Progress Notes (Signed)
Pt transferred to SDU with new a-fib, Cardizem gtt started per NP order. Pt BP running low throughout the night. Last BP 83/50 (59). HR 98. Page NP about pt low BP. Advised to pause Cardizem gtt @ 0000 until BP trend up. Order received to give NS bolus. Will continue to monitor pt.

## 2017-06-01 NOTE — Progress Notes (Signed)
patient restless, congested, resp rate 28, RT attempted breathing tx, now A Fib HR rapid 140 -170, Diltiazem drip restarted at 5 mg/hr, B/P 129/68

## 2017-06-01 NOTE — Progress Notes (Signed)
Patient Demographics:    Patrick Carr, is a 84 y.o. male, DOB - 1934/01/06, OYD:[id number]  Admit date - 06/22/2017   Admitting Physician Hill Mackie Denton Brick, MD  Outpatient Primary MD for the patient is Deland Pretty, MD  LOS - 2   Chief Complaint  Patient presents with  . Loss of Consciousness        Subjective:    Patrick Carr today has no fevers, no emesis, had tachycardia and hypotension overnight Cardizem drip, was transferred to stepdown unit  Assessment  & Plan :    Principal Problem:   Sepsis due to Pulm Source Active Problems:   Hypertension   Small cell carcinoma of right lung ([place])   Community acquired pneumonia   Hypoxic Respiratory failure, acute ([place])   CAP (community acquired pneumonia)  Brief Summary:- 84 year old male with past medical history relevant for prior history of right lung small cell carcinoma status post chemotherapy in 2015 admitted on 05/28/2017 with sepsis secondary to pulmonary source/right-sided pneumonia with effusion.  On 05/31/2017 patient was noted to have new onset A. fib with RVR in the setting of sepsis and hypoxia, blood cultures from 06/19/2017 with strep pneumo  Plan:-  1)Strep Pneumonia Bacteremia Bacteremia and Sepsis secondary to Pulmonary source-  repeat chest x-ray on 05/31/17 with right-sided pneumonia with large pleural effusion,  hypoxia persis white count is down to 5.9 from 11k,     lactic acid elevated at 6 on admission, c/n bronchodilators, mucolytics, supplemental oxygen. C/n IV fluids, IV Rocephin (Abx started 05/30/16) increased to 2 g every 24 hours on 05/31/17), continue azithromycin 500 daily.   2)New Onset Afib with RVR Vs MAT- in the setting of sepsis and hypoxia severe COPD, c.n  IV Cardizem drip for rate control, cardiology advises holding off on anticoagulation at this time mg/h, echocardiogram witn EF > 60 %.  Troponin has been borderline,   TSH is wnl.  Please see full cardiology consult note from 06/01/2017  3)Acute hypoxic respiratory failure-secondary to #1 and # 2 above treat as above in # 1 and # 2, c/n  supplemental oxygen  4)H/o Rt Lung SCC-last chemotherapy was December 2015, apparently patient has been in remission, ??? Need for Rt sided Thoracentensis, Rx acute pneumonia  5)Possible Syncope-suspect patient went down due to significant tachycardia, tachypnea and hypoxia, was found on the floor, the patient denies actually falling,   CT Head is unremarkable, echo noted troponin is borderline.  Patient developed tachyarrhythmia  6)AKI-resolved, creatinine is down to 0.8 from 1.97 on admission,  from a baseline of 1.1, c/n to hold losartan, hydrate IV n.p.o., avoid nephrotoxic agents  7)Social/Ethics-plan of care and advanced directives discussed with patient, his wife and daughter,, patient is a full code  Code Status : Full   Disposition Plan  : TBD  Consults  :  none   DVT Prophylaxis  :   Heparin  Lab Results  Component Value Date   PLT 75 (L) 06/01/2017    Inpatient Medications  Scheduled Meds: . alfuzosin  10 mg Oral QHS  . aspirin EC  81 mg Oral QHS  . atorvastatin  40 mg Oral QHS  . guaiFENesin  600 mg Oral BID  . heparin  5,000 Units Subcutaneous Q8H  .  levalbuterol  0.63 mg Nebulization TID  . senna  1 tablet Oral BID  . sodium chloride flush  3 mL Intravenous Q12H   Continuous Infusions: . sodium chloride    . sodium chloride 100 mL/hr at 06/01/17 0900  . azithromycin 500 mg (06/01/17 1726)  . cefTRIAXone (ROCEPHIN)  IV Stopped (06/01/17 1720)  . diltiazem (CARDIZEM) infusion 5 mg/hr (06/01/17 1802)   PRN Meds:.sodium chloride, acetaminophen **OR** acetaminophen, albuterol, ALPRAZolam, hydrALAZINE, ondansetron **OR** ondansetron (ZOFRAN) IV, polyethylene glycol, sodium chloride flush, traMADol, traZODone    Anti-infectives (From admission, onward)   Start     Dose/Rate Route Frequency  Ordered Stop   05/31/17 1600  cefTRIAXone (ROCEPHIN) 2 g in dextrose 5 % 50 mL IVPB     2 g 100 mL/hr over 30 Minutes Intravenous Every 24 hours 05/31/17 1304     06/22/2017 1600  azithromycin (ZITHROMAX) 500 mg in dextrose 5 % 250 mL IVPB     500 mg 250 mL/hr over 60 Minutes Intravenous Every 24 hours 06/24/2017 1508     05/26/2017 1600  cefTRIAXone (ROCEPHIN) 1 g in dextrose 5 % 50 mL IVPB  Status:  Discontinued     1 g 100 mL/hr over 30 Minutes Intravenous Every 24 hours 06/07/2017 1508 05/31/17 1304   06/22/2017 1515  cefTRIAXone (ROCEPHIN) 1 g in dextrose 5 % 50 mL IVPB  Status:  Discontinued     1 g 100 mL/hr over 30 Minutes Intravenous  Once 06/17/2017 1501 06/13/2017 1508   05/29/2017 1515  azithromycin (ZITHROMAX) 500 mg in dextrose 5 % 250 mL IVPB  Status:  Discontinued     500 mg 250 mL/hr over 60 Minutes Intravenous  Once 06/19/2017 1501 06/24/2017 1508        Objective:   Vitals:   06/01/17 1700 06/01/17 1900 06/01/17 2000 06/01/17 2042  BP: 136/69 129/70 (!) 151/55   Pulse: (!) 57 (!) 36 (!) 31   Resp: (!) 27 (!) 24 (!) 28   Temp:   (!) 97.3 F (36.3 C)   TempSrc:   Oral   SpO2: (!) 89% 99% 95% 96%  Weight:      Height:        Wt Readings from Last 3 Encounters:  05/31/17 53.5 kg (117 lb 15.1 oz)  02/02/17 55.5 kg (122 lb 4.8 oz)  08/04/16 56.4 kg (124 lb 4 oz)     Intake/Output Summary (Last 24 hours) at 06/01/2017 2047 Last data filed at 06/01/2017 1802 Gross per 24 hour  Intake 3205.04 ml  Output 950 ml  Net 2255.04 ml     Physical Exam  Gen:- Awake , was sleepy after Ativan HEENT:- [place].AT, No sclera icterus Neck-Supple Neck,No JVD,.  Lungs-  Diminished especially  on the right with scattered rhonchi rales CV- S1, S2 normal, irregularly irregular with heart rate in the 130s3 Abd-  +ve B.Sounds, Abd Soft, No tenderness,    Extremity/Skin:- No  edema,    Psych-sleepy on and off after Ativan Neuro-generalized weakness without new focal deficits   Data Review:    Micro Results Recent Results (from the past 240 hour(s))  Blood culture (routine x 2)     Status: Abnormal (Preliminary result)   Collection Time: 06/20/2017  2:53 PM  Result Value Ref Range Status   Specimen Description BLOOD RIGHT FOREARM  Final   Special Requests   Final    BOTTLES DRAWN AEROBIC AND ANAEROBIC Blood Culture adequate volume   Culture  Setup Time  Final    GRAM POSITIVE COCCI IN BOTH AEROBIC AND ANAEROBIC BOTTLES CRITICAL RESULT CALLED TO, READ BACK BY AND VERIFIED WITH: E WILLIAMSON,PHARMD AT 1232 05/31/17 BY L BENFIELD    Culture (A)  Final    STREPTOCOCCUS PNEUMONIAE SUSCEPTIBILITIES TO FOLLOW Performed at [facility], [place] [address], [place], [place] [address]    Report Status PENDING  Incomplete  Blood Culture ID Panel (Reflexed)     Status: Abnormal   Collection Time: 06/10/2017  2:53 PM  Result Value Ref Range Status   Enterococcus species NOT DETECTED NOT DETECTED Final   Listeria monocytogenes NOT DETECTED NOT DETECTED Final   Staphylococcus species NOT DETECTED NOT DETECTED Final   Staphylococcus aureus NOT DETECTED NOT DETECTED Final   Streptococcus species DETECTED (A) NOT DETECTED Final    Comment: CRITICAL RESULT CALLED TO, READ BACK BY AND VERIFIED WITH: E WILLIAMSON,PHARMD AT 1232 05/31/17 BY L BENFIELD    Streptococcus agalactiae NOT DETECTED NOT DETECTED Final   Streptococcus pneumoniae DETECTED (A) NOT DETECTED Final    Comment: CRITICAL RESULT CALLED TO, READ BACK BY AND VERIFIED WITH: E WILLIAMSON,PHARMD AT 1232 05/31/17 BY L BENFIELD    Streptococcus pyogenes NOT DETECTED NOT DETECTED Final   Acinetobacter baumannii NOT DETECTED NOT DETECTED Final   Enterobacteriaceae species NOT DETECTED NOT DETECTED Final   Enterobacter cloacae complex NOT DETECTED NOT DETECTED Final   Escherichia coli NOT DETECTED NOT DETECTED Final   Klebsiella oxytoca NOT DETECTED NOT DETECTED Final   Klebsiella pneumoniae NOT DETECTED NOT DETECTED Final    Proteus species NOT DETECTED NOT DETECTED Final   Serratia marcescens NOT DETECTED NOT DETECTED Final   Haemophilus influenzae NOT DETECTED NOT DETECTED Final   Neisseria meningitidis NOT DETECTED NOT DETECTED Final   Pseudomonas aeruginosa NOT DETECTED NOT DETECTED Final   Candida albicans NOT DETECTED NOT DETECTED Final   Candida glabrata NOT DETECTED NOT DETECTED Final   Candida krusei NOT DETECTED NOT DETECTED Final   Candida parapsilosis NOT DETECTED NOT DETECTED Final   Candida tropicalis NOT DETECTED NOT DETECTED Final    Comment: Performed at [facility], [place] [address], [place], [place] [address]  Blood culture (routine x 2)     Status: Abnormal (Preliminary result)   Collection Time: 05/27/2017  3:01 PM  Result Value Ref Range Status   Specimen Description BLOOD LEFT ANTECUBITAL  Final   Special Requests   Final    BOTTLES DRAWN AEROBIC AND ANAEROBIC Blood Culture adequate volume   Culture  Setup Time   Final    GRAM POSITIVE COCCI IN PAIRS IN CHAINS IN BOTH AEROBIC AND ANAEROBIC BOTTLES CRITICAL VALUE NOTED.  VALUE IS CONSISTENT WITH PREVIOUSLY REPORTED AND CALLED VALUE. Performed at [facility], [place] [address] [place], [place] [address]    Culture STREPTOCOCCUS PNEUMONIAE (A)  Final   Report Status PENDING  Incomplete  Respiratory Panel by PCR     Status: None   Collection Time: 06/24/2017  4:05 PM  Result Value Ref Range Status   Adenovirus NOT DETECTED NOT DETECTED Final   Coronavirus 229E NOT DETECTED NOT DETECTED Final   Coronavirus HKU1 NOT DETECTED NOT DETECTED Final   Coronavirus NL63 NOT DETECTED NOT DETECTED Final   Coronavirus OC43 NOT DETECTED NOT DETECTED Final   Metapneumovirus NOT DETECTED NOT DETECTED Final   Rhinovirus / Enterovirus NOT DETECTED NOT DETECTED Final   Influenza A NOT DETECTED NOT DETECTED Final   Influenza B NOT DETECTED NOT  DETECTED Final   Parainfluenza Virus 1 NOT DETECTED NOT DETECTED Final   Parainfluenza Virus 2 NOT  DETECTED NOT DETECTED Final   Parainfluenza Virus 3 NOT DETECTED NOT DETECTED Final   Parainfluenza Virus 4 NOT DETECTED NOT DETECTED Final   Respiratory Syncytial Virus NOT DETECTED NOT DETECTED Final   Bordetella pertussis NOT DETECTED NOT DETECTED Final   Chlamydophila pneumoniae NOT DETECTED NOT DETECTED Final   Mycoplasma pneumoniae NOT DETECTED NOT DETECTED Final    Comment: Performed at [facility], [place] [address], [place], [place] [address]  MRSA PCR Screening     Status: None   Collection Time: 05/31/17  8:28 PM  Result Value Ref Range Status   MRSA by PCR NEGATIVE NEGATIVE Final    Comment:        The GeneXpert MRSA Assay (FDA approved for NASAL specimens only), is one component of a comprehensive MRSA colonization surveillance program. It is not intended to diagnose MRSA infection nor to guide or monitor treatment for MRSA infections.     Radiology Reports Dg Chest 2 View  Result Date: 05/26/2017 CLINICAL DATA:  Cough and fever for several days. EXAM: CHEST  2 VIEW COMPARISON:  01/30/2017 chest CT and prior studies FINDINGS: Airspace disease throughout the right lower lobe is compatible with pneumonia. Upper limits normal heart size noted. Fullness of the right hilar region is grossly unchanged. No pleural effusion or pneumothorax. Right apical pleuroparenchymal opacities/scarring again noted. IMPRESSION: Diffuse right lower lobe airspace disease compatible with pneumonia. Radiographic follow-up to resolution is recommended. Electronically Signed   By: Margarette Canada M.D.   On: 06/16/2017 16:03   Ct Head Wo Contrast  Result Date: 06/25/2017 CLINICAL DATA:  Altered level of consciousness/unresponsive EXAM: CT HEAD WITHOUT CONTRAST TECHNIQUE: Contiguous axial images were obtained from the base of the skull through the vertex without intravenous contrast. COMPARISON:  MRI brain dated 01/13/2014 FINDINGS: Brain: No evidence of acute infarction, hemorrhage, hydrocephalus,  extra-axial collection or mass lesion/mass effect. Mild cortical atrophy. Subcortical white matter and periventricular small vessel ischemic changes. Vascular: Mild intracranial atherosclerosis. Skull: Normal. Negative for fracture or focal lesion. Sinuses/Orbits: The visualized paranasal sinuses are essentially clear. The mastoid air cells are unopacified. Other: None. IMPRESSION: No evidence of acute intracranial abnormality. Mild atrophy with small vessel ischemic changes. Electronically Signed   By: Julian Hy M.D.   On: 06/20/2017 15:37   Dg Chest Port 1 View  Result Date: 06/01/2017 CLINICAL DATA:  84 year old male with shortness of breath. History of right-sided lung cancer. Subsequent encounter. EXAM: PORTABLE CHEST 1 VIEW COMPARISON:  05/31/2017 and 06/22/2017 chest x-ray. 01/30/2017 chest CT. FINDINGS: Right mid to lower lobe consolidation with right-sided pleural effusion. Although it is possible this represents result of infectious infiltrate, per prior CT, patient had right-sided lung cancer with treated neoplasm superior segment right lower lobe. Recurrence therefore not excluded. If further delineation is clinically desired, chest CT can be obtained. Alternatively, treatment of any clinically suspected infectious infiltrate with serial follow-up chest x-rays recommended. Heart size difficult adequately assess. Central pulmonary vascular prominence. Left base subsegmental atelectasis. Biapical pleural scarring greater on the right. Vascular calcifications including aortic and carotid bifurcation calcifications. IMPRESSION: Similar appearance of consolidation right mid to lower lobe which may represent infiltrate/ atelectasis with pleural effusion but cannot exclude recurrent tumor given patient's history. Follow-up as detailed above. Aortic Atherosclerosis (ICD10-I70.0). Electronically Signed   By: Genia Del M.D.   On: 06/01/2017 08:26   Dg Chest Port 1  View  Result Date:  05/31/2017 CLINICAL DATA:  Shortness of breath today, followup pneumonia EXAM: PORTABLE CHEST 1 VIEW COMPARISON:  Portable exam 1418 hours compared 05/30/2016 FINDINGS: Enlargement of cardiac silhouette. Atherosclerotic calcification aorta. Mediastinal contours and pulmonary vascularity normal. COPD changes with increased infiltrate in the mid to lower RIGHT lung associated with increased RIGHT pleural effusion. Additional in trade in the retrocardiac LEFT lower lobe. Upper lobes clear. No pneumothorax. Bones demineralized. IMPRESSION: Enlargement of cardiac silhouette. Increased atelectasis versus infiltrate in retrocardiac LEFT lower lobe. Increased lower RIGHT lung infiltrate with associated increase in RIGHT pleural effusion. Electronically Signed   By: Lavonia Dana M.D.   On: 05/31/2017 14:36     CBC Recent Labs  Lab 05/31/2017 1453 05/31/17 0606 06/01/17 0947  WBC 11.0* 5.9 6.4  HGB 14.6 10.8* 10.6*  HCT 43.4 32.4* 33.1*  PLT 136* 97* 75*  MCV 98.4 97.6 100.6*  MCH 33.1 32.5 32.2  MCHC 33.6 33.3 32.0  RDW 14.3 14.4 14.8    Chemistries  Recent Labs  Lab 06/03/2017 1453 05/31/17 0606 06/01/17 0947  NA 140 141 144  K 4.0 3.7 3.5  CL 102 110 116*  CO2 24 23 22   GLUCOSE 104* 101* 92  BUN 39* 44* 34*  CREATININE 1.97* 1.37* 0.88  CALCIUM 9.1 7.9* 7.9*  MG  --   --  1.9   ------------------------------------------------------------------------------------------------------------------ No results for input(s): CHOL, HDL, LDLCALC, TRIG, CHOLHDL, LDLDIRECT in the last 72 hours.  No results found for: HGBA1C ------------------------------------------------------------------------------------------------------------------ Recent Labs    06/01/17 0337  TSH 1.787   ------------------------------------------------------------------------------------------------------------------ No results for input(s): VITAMINB12, FOLATE, FERRITIN, TIBC, IRON, RETICCTPCT in the last 72  hours.  Coagulation profile No results for input(s): INR, PROTIME in the last 168 hours.  No results for input(s): DDIMER in the last 72 hours.  Cardiac Enzymes Recent Labs  Lab 06/08/2017 2111 05/29/2017 2358 05/31/17 0606  TROPONINI 0.04* 0.03* <0.03   ------------------------------------------------------------------------------------------------------------------ No results found for: BNP   Roxan Hockey M.D on 06/01/2017 at 8:47 PM  Between 7am to 7pm - Pager - [phone]  After 7pm go to [email or web address] - password [email or web address]  [facility] -  Office  [phone]   Voice Recognition Viviann Spare dictation system was used to create this note, attempts have been made to correct errors. Please contact the author with questions and/or clarifications.

## 2017-06-01 NOTE — Consult Note (Signed)
Cardiology Consultation:   Patient ID: Patrick Carr; [id number]; Jun 28, 1933   Admit date: 06/16/2017 Date of Consult: 06/01/2017  Primary Care Provider: Deland Pretty, MD Primary Cardiologist: Kirk Ruths, MD New Primary Electrophysiologist:  NA   Patient Profile:   Patrick Carr is a 84 y.o. male with a hx of Lung cancer, Rt small cell, HTN who is being seen today for the evaluation of a fib with RVR at the request of Dr. Denton Brick.  History of Present Illness:   Patrick Carr a hx of Lung cancer, Rt small cell- in remission, HTN now admitted with CAP, acute respiratory failure and sepsis.  He was admitted 06/22/2017 with 3-4 weeks hx of cough, SOB malaise, fevers and fatigue.  He was found on the floor. 02 on RA in the high 70s, fever of 101.2 and lactic acid at 6.  He denied chest pain.  Unsure reason pt found on ground. ? Syncope vs. Weakness.  EKG:  The EKG was personally reviewed and demonstrates:  ST  With freq PACs and RBBB-  Similar to EKG from 06/2014 though now with more freq PACs.   EKG yesterday appears to a fib though at times P waves are noted.    Telemetry:  Telemetry was personally reviewed and demonstrates:  Currently SR with freq PACs   At times possible a fib with RVR vs atrial tach, all with RBBB  Troponin 0.04 0.03 and <0.03  TSH 1.787  Initial ABGs on 100% Ph 7.292, pC02 48.6, p02 114 bicarb 22 Na 140, K+ 4.0, Cr 1.97,  WBC 11, Hgb 14.6, plts 136 Neg respiratory panel  CT head without acute abnormality 2V CXR Diffuse right lower lobe airspace disease compatible with pneumonia. Radiographic follow-up to resolution is recommended 1V CXR today:  Similar appearance of consolidation right mid to lower lobe which may represent infiltrate/ atelectasis with pleural effusion but cannot exclude recurrent tumor given patient's history. Follow-up as detailed above.  Pt admitted placed on IV ABX, oxygen, Losartan held for elevation in Cr.  He went into a fib with RVR.   IV dilt was started but BP low, hr improved and dilt held.    Echo done yesterday with EF 60-65%, wall motion was normal and there were no regional wall motion abnormalities.  LA was mildly dilated, PA pk pressure 51 mmHg.  Difficult study to rapid HR.    Currently not really answering questions.  His wife and daughter and grand kids in room.  His wife is in a wheelchair after falling in the parking lot coming to visit and has injuries.  His wife thought he had a cardiologist but daughter says no.  Pt is not one to complain and has not complained of anything.   He tells his family he is fine as a rule.  No recent hematuria or melena though they say he would not tell them.     Past Medical History:  Diagnosis Date  . BPH (benign prostatic hyperplasia)   . Cancer ([place])    Lung  . Hernia, inguinal left   . History of blood transfusion    artery in sinus bleed  . History of kidney stones   . Hyperlipidemia   . Hypertension   . Irregular heart rate   . Lesion of right lung    RLL  . Phlebitis 03/20/14   bilateral forearms  . Radiation 02/08/14-03/21/14   Stage III lung cancer 30 fractions  . RBBB    no palpations  .  Renal cysts, acquired, bilateral     Past Surgical History:  Procedure Laterality Date  . IR GENERIC HISTORICAL  02/13/2016   IR REMOVAL TUN ACCESS W/ PORT W/O FL MOD SED 02/13/2016 Greggory Keen, MD [facility]-INTERV RAD  . NO PAST SURGERIES    . Sinus artery surgery     not surgery  . VIDEO BRONCHOSCOPY N/A 07/04/2014   Procedure: VIDEO BRONCHOSCOPY WITH  ENDOBRONCIAL BIOPSY;  Surgeon: [name], MD;  Location: [place];  Service: Thoracic;  Laterality: N/A;  . VIDEO BRONCHOSCOPY WITH ENDOBRONCHIAL NAVIGATION N/A 01/18/2014   Procedure: VIDEO BRONCHOSCOPY WITH ENDOBRONCHIAL NAVIGATION;  Surgeon: [name], MD;  Location: [facility];  Service: Thoracic;  Laterality: N/A;  . VIDEO BRONCHOSCOPY WITH ENDOBRONCHIAL ULTRASOUND N/A 01/18/2014   Procedure: VIDEO BRONCHOSCOPY WITH  ENDOBRONCHIAL ULTRASOUND;  Surgeon: [name], MD;  Location: [place] [facility];  Service: Thoracic;  Laterality: N/A;     Home Medications:  Prior to Admission medications   Medication Sig Start Date End Date Taking? Authorizing Provider  alfuzosin (UROXATRAL) 10 MG 24 hr tablet Take 10 mg by mouth at bedtime.    Yes [provider]  ALPRAZolam (XANAX) 0.25 MG tablet Take 1 tablet (0.25 mg total) by mouth at bedtime as needed for anxiety. 08/23/15  Yes [name], MD  aspirin 81 MG tablet Take 81 mg by mouth at bedtime.    Yes [provider]  atorvastatin (LIPITOR) 40 MG tablet Take 40 mg by mouth at bedtime.    Yes [provider]  losartan (COZAAR) 100 MG tablet Take 100 mg by mouth at bedtime.  11/18/15  Yes [provider]    Inpatient Medications: Scheduled Meds: . alfuzosin  10 mg Oral QHS  . aspirin EC  81 mg Oral QHS  . atorvastatin  40 mg Oral QHS  . guaiFENesin  600 mg Oral BID  . heparin  5,000 Units Subcutaneous Q8H  . ipratropium-albuterol  3 mL Nebulization TID  . senna  1 tablet Oral BID  . sodium chloride flush  3 mL Intravenous Q12H   Continuous Infusions: . sodium chloride    . sodium chloride 125 mL/hr at 06/01/17 0349  . azithromycin Stopped (05/31/17 1705)  . cefTRIAXone (ROCEPHIN)  IV Stopped (05/31/17 1705)  . diltiazem (CARDIZEM) infusion 5 mg/hr (06/01/17 0740)   PRN Meds: sodium chloride, acetaminophen **OR** acetaminophen, albuterol, ALPRAZolam, hydrALAZINE, ondansetron **OR** ondansetron (ZOFRAN) IV, polyethylene glycol, sodium chloride flush, traMADol, traZODone  Allergies:    Allergies  Allergen Reactions  . Contrast Media [Iodinated Diagnostic Agents]   . Other Other (See Comments)    Pneumonia vaccine causes arm swelling, caused redness  . Pneumococcal Vaccines     Arm swelling    Social History:   Social History   Socioeconomic History  . Marital status: Married    Spouse name: Not on file  .  Number of children: Not on file  . Years of education: Not on file  . Highest education level: Not on file  Social Needs  . Financial resource strain: Not on file  . Food insecurity - worry: Not on file  . Food insecurity - inability: Not on file  . Transportation needs - medical: Not on file  . Transportation needs - non-medical: Not on file  Occupational History  . Not on file  Tobacco Use  . Smoking status: Former Smoker    Packs/day: 0.50    Years: 62.00    Pack years: 31.00  Last attempt to quit: 01/29/2014    Years since quitting: 3.3  . Smokeless tobacco: Never Used  Substance and Sexual Activity  . Alcohol use: No  . Drug use: No  . Sexual activity: Not on file  Other Topics Concern  . Not on file  Social History Narrative  . Not on file    Family History:    Family History  Problem Relation Age of Onset  . Heart disease Mother   . Cancer Father      ROS:  Please see the history of present illness.  ROS  General:+ colds + fevers, no weight changes, + fatigue Skin:no rashes or ulcers HEENT:no blurred vision, no congestion CV:see HPI PUL:see HPI GI:no diarrhea constipation or melena, no indigestion GU:no hematuria, no dysuria MS:no joint pain, no claudication Neuro:no syncope, no lightheadedness- found in floor, curled into ball, but he woke and talked to wife when she found him, he was in floor at least 10 min.  Endo:no diabetes, no thyroid disease .     Physical Exam/Data:   Vitals:   06/01/17 0600 06/01/17 0727 06/01/17 0735 06/01/17 0800  BP: (!) 109/53  129/68   Pulse: (!) 57  (!) 120   Resp: (!) 27  (!) 31   Temp:    (!) 97.4 F (36.3 C)  TempSrc:    Axillary  SpO2: 96% 97% 92%   Weight:      Height:        Intake/Output Summary (Last 24 hours) at 06/01/2017 0855 Last data filed at 06/01/2017 0700 Gross per 24 hour  Intake 3193.33 ml  Output 950 ml  Net 2243.33 ml   Filed Weights   06/19/2017 1437 05/29/2017 2000 05/31/17 2000  Weight:  120 lb (54.4 kg) 111 lb 12.4 oz (50.7 kg) 117 lb 15.1 oz (53.5 kg)   Body mass index is 19.63 kg/m.  General:  Frail, thin male, in no acute distress resting with eyes closed, answers question but I did not understand what was said. HEENT: normal Lymph: no adenopathy Neck: no JVD Endocrine:  No thryomegaly Vascular: No carotid bruits; 2+ pedal pulses bil  Cardiac:  normal S1, S2; RRR; no murmur gallup rub or click Lungs:  clear to auscultation bilaterally, ant, no wheezing, rhonchi or rales  Abd: soft, nontender, no hepatomegaly  Ext: no edema Musculoskeletal:  No deformities, BUE and BLE strength equal Skin: warm and dry  Neuro:  Answers questions when asked but I could not understand him , no focal abnormalities noted Psych:  flat affect     Relevant CV Studies: TTE 05/31/17  Study Conclusions  - Left ventricle: The cavity size was normal. Systolic function was   normal. The estimated ejection fraction was in the range of 60%   to 65%. Wall motion was normal; there were no regional wall   motion abnormalities. - Left atrium: The atrium was mildly dilated. - Pulmonary arteries: PA peak pressure: 51 mm Hg (S). - Line: A venous catheter was visualized in the superior vena cava,   with its tip in the right atrium. No abnormal features noted. - Impressions: Difficult study due to rapid HR.  Impressions:  - Difficult study due to rapid HR. Laboratory Data:  Chemistry Recent Labs  Lab 06/15/2017 1453 05/31/17 0606  NA 140 141  K 4.0 3.7  CL 102 110  CO2 24 23  GLUCOSE 104* 101*  BUN 39* 44*  CREATININE 1.97* 1.37*  CALCIUM 9.1 7.9*  GFRNONAA 30* 47*  GFRAA 35* 54*  ANIONGAP 14 8    No results for input(s): PROT, ALBUMIN, AST, ALT, ALKPHOS, BILITOT in the last 168 hours. Hematology Recent Labs  Lab 06/15/2017 1453 05/31/17 0606  WBC 11.0* 5.9  RBC 4.41 3.32*  HGB 14.6 10.8*  HCT 43.4 32.4*  MCV 98.4 97.6  MCH 33.1 32.5  MCHC 33.6 33.3  RDW 14.3 14.4  PLT  136* 97*   Cardiac Enzymes Recent Labs  Lab 06/23/2017 2111 06/03/2017 2358 05/31/17 0606  TROPONINI 0.04* 0.03* <0.03   No results for input(s): TROPIPOC in the last 168 hours.  BNPNo results for input(s): BNP, PROBNP in the last 168 hours.  DDimer No results for input(s): DDIMER in the last 168 hours.  Radiology/Studies:  Dg Chest 2 View  Result Date: 06/11/2017 CLINICAL DATA:  Cough and fever for several days. EXAM: CHEST  2 VIEW COMPARISON:  01/30/2017 chest CT and prior studies FINDINGS: Airspace disease throughout the right lower lobe is compatible with pneumonia. Upper limits normal heart size noted. Fullness of the right hilar region is grossly unchanged. No pleural effusion or pneumothorax. Right apical pleuroparenchymal opacities/scarring again noted. IMPRESSION: Diffuse right lower lobe airspace disease compatible with pneumonia. Radiographic follow-up to resolution is recommended. Electronically Signed   By: Margarette Canada M.D.   On: 06/17/2017 16:03   Ct Head Wo Contrast  Result Date: 06/02/2017 CLINICAL DATA:  Altered level of consciousness/unresponsive EXAM: CT HEAD WITHOUT CONTRAST TECHNIQUE: Contiguous axial images were obtained from the base of the skull through the vertex without intravenous contrast. COMPARISON:  MRI brain dated 01/13/2014 FINDINGS: Brain: No evidence of acute infarction, hemorrhage, hydrocephalus, extra-axial collection or mass lesion/mass effect. Mild cortical atrophy. Subcortical white matter and periventricular small vessel ischemic changes. Vascular: Mild intracranial atherosclerosis. Skull: Normal. Negative for fracture or focal lesion. Sinuses/Orbits: The visualized paranasal sinuses are essentially clear. The mastoid air cells are unopacified. Other: None. IMPRESSION: No evidence of acute intracranial abnormality. Mild atrophy with small vessel ischemic changes. Electronically Signed   By: Julian Hy M.D.   On: 05/29/2017 15:37   Dg Chest Port 1  View  Result Date: 06/01/2017 CLINICAL DATA:  84 year old male with shortness of breath. History of right-sided lung cancer. Subsequent encounter. EXAM: PORTABLE CHEST 1 VIEW COMPARISON:  05/31/2017 and 06/18/2017 chest x-ray. 01/30/2017 chest CT. FINDINGS: Right mid to lower lobe consolidation with right-sided pleural effusion. Although it is possible this represents result of infectious infiltrate, per prior CT, patient had right-sided lung cancer with treated neoplasm superior segment right lower lobe. Recurrence therefore not excluded. If further delineation is clinically desired, chest CT can be obtained. Alternatively, treatment of any clinically suspected infectious infiltrate with serial follow-up chest x-rays recommended. Heart size difficult adequately assess. Central pulmonary vascular prominence. Left base subsegmental atelectasis. Biapical pleural scarring greater on the right. Vascular calcifications including aortic and carotid bifurcation calcifications. IMPRESSION: Similar appearance of consolidation right mid to lower lobe which may represent infiltrate/ atelectasis with pleural effusion but cannot exclude recurrent tumor given patient's history. Follow-up as detailed above. Aortic Atherosclerosis (ICD10-I70.0). Electronically Signed   By: Genia Del M.D.   On: 06/01/2017 08:26   Dg Chest Port 1 View  Result Date: 05/31/2017 CLINICAL DATA:  Shortness of breath today, followup pneumonia EXAM: PORTABLE CHEST 1 VIEW COMPARISON:  Portable exam 1418 hours compared 05/30/2016 FINDINGS: Enlargement of cardiac silhouette. Atherosclerotic calcification aorta. Mediastinal contours and pulmonary vascularity normal. COPD changes with increased infiltrate in the mid to lower RIGHT lung associated  with increased RIGHT pleural effusion. Additional in trade in the retrocardiac LEFT lower lobe. Upper lobes clear. No pneumothorax. Bones demineralized. IMPRESSION: Enlargement of cardiac silhouette. Increased  atelectasis versus infiltrate in retrocardiac LEFT lower lobe. Increased lower RIGHT lung infiltrate with associated increase in RIGHT pleural effusion. Electronically Signed   By: Lavonia Dana M.D.   On: 05/31/2017 14:36    Assessment and Plan:   1. A fib with RVR vs MF atrial tach to 190 currently in SR with PACs-- hypotensive with dilt drip--HR improved   dilt at 7.5 mg per min CHA2DS2VASc is 3 at least- add anticoagulation if Dr. Stanford Breed feels this is a fib.  though some concern with his recent fall.  May only be due to acute infection.  Dr. Stanford Breed to see. He is on ASA and heparin 5000 u subcu.  2.    Sepsis due to CAP per Im  3.     CAP per IM  4.     Minimally elevated troponin due to HR and demand ischemia. No hx CAD  5.      Hypotensive at times labile BP currently 496 systolic  6.      CKD 3-4  Improving  7.       HLD on lipitor 40 mg    For questions or updates, please contact [place] Please consult [email or web address] for contact info under Cardiology/STEMI.   Signed, Cecilie Kicks, NP  06/01/2017 8:55 AM  As above, patient seen and examined.  Briefly he is an 84 year old male with past medical history of lung cancer and hypertension admitted with pneumonia for evaluation of question atrial fibrillation.  Patient is not responding to questions at time of evaluation.  Per his family there has been a viral illness at home.  Over 3-4 days prior to admission the patient complained of increasing nonproductive cough and dyspnea.  Patient was found on the floor and was noted to be hypoxic and febrile.  He has been admitted and treated for pneumonia.  Telemetry shows possible atrial fibrillation and cardiology asked to evaluate.  Echocardiogram shows normal LV function and mild left atrial enlargement.  Electrocardiogram shows sinus tachycardia with PACs and right bundle branch block.  I have personally reviewed telemetry.  Patient appears to have sinus tachycardia with runs of PAT and  frequent PACs.  I cannot find evidence of atrial fibrillation at this point.  1 probable paroxysmal atrial tachycardia versus multifocal atrial tachycardia-as outlined I have not seen atrial fibrillation at this point though certainly given his underlying pulmonary disease and pneumonia he is at risk.  We will continue Cardizem at this point. CHADSvasc 3.  If he develops atrial fibrillation he would need anticoagulation.  Note LV function is normal.  2 pneumonia-Antibiotics per primary care.  3 Acute kidney disease-likely dehydrated at time of admission.  Improving with hydration.  Kirk Ruths, MD

## 2017-06-01 NOTE — Progress Notes (Signed)
Leelyn's spouse, Joellen Jersey, is currently in [facility] ED room [facility] following a fall.   [facility] / [place], MDiv

## 2017-06-02 ENCOUNTER — Inpatient Hospital Stay (HOSPITAL_COMMUNITY): Payer: Medicare Other

## 2017-06-02 DIAGNOSIS — J9601 Acute respiratory failure with hypoxia: Secondary | ICD-10-CM

## 2017-06-02 DIAGNOSIS — J181 Lobar pneumonia, unspecified organism: Secondary | ICD-10-CM

## 2017-06-02 DIAGNOSIS — A419 Sepsis, unspecified organism: Principal | ICD-10-CM

## 2017-06-02 LAB — BLOOD GAS, ARTERIAL
ACID-BASE DEFICIT: 5.1 mmol/L — AB (ref 0.0–2.0)
ACID-BASE DEFICIT: 5.3 mmol/L — AB (ref 0.0–2.0)
BICARBONATE: 22 mmol/L (ref 20.0–28.0)
BICARBONATE: 22.4 mmol/L (ref 20.0–28.0)
Drawn by: 270211
Drawn by: 270211
FIO2: 1
FIO2: 0.7
LHR: 14 {breaths}/min
O2 Saturation: 94.6 %
O2 Saturation: 97.4 %
PCO2 ART: 52.1 mmHg — AB (ref 32.0–48.0)
PEEP/CPAP: 5 cmH2O
PH ART: 7.248 — AB (ref 7.350–7.450)
PO2 ART: 107 mmHg (ref 83.0–108.0)
Patient temperature: 98.6
Patient temperature: 37
Sample type: 270211
VT: 460 mL
pCO2 arterial: 57.2 mmHg — ABNORMAL HIGH (ref 32.0–48.0)
pH, Arterial: 7.217 — ABNORMAL LOW (ref 7.350–7.450)
pO2, Arterial: 81.8 mmHg — ABNORMAL LOW (ref 83.0–108.0)

## 2017-06-02 LAB — CULTURE, BLOOD (ROUTINE X 2)
Special Requests: ADEQUATE
Special Requests: ADEQUATE

## 2017-06-02 LAB — CBC
HCT: 33.7 % — ABNORMAL LOW (ref 39.0–52.0)
HEMOGLOBIN: 10.8 g/dL — AB (ref 13.0–17.0)
MCH: 32.4 pg (ref 26.0–34.0)
MCHC: 32 g/dL (ref 30.0–36.0)
MCV: 101.2 fL — ABNORMAL HIGH (ref 78.0–100.0)
Platelets: 78 10*3/uL — ABNORMAL LOW (ref 150–400)
RBC: 3.33 MIL/uL — AB (ref 4.22–5.81)
RDW: 15.1 % (ref 11.5–15.5)
WBC: 6.1 10*3/uL (ref 4.0–10.5)

## 2017-06-02 LAB — BASIC METABOLIC PANEL
Anion gap: 5 (ref 5–15)
BUN: 28 mg/dL — AB (ref 6–20)
CO2: 23 mmol/L (ref 22–32)
CREATININE: 0.85 mg/dL (ref 0.61–1.24)
Calcium: 8.2 mg/dL — ABNORMAL LOW (ref 8.9–10.3)
Chloride: 117 mmol/L — ABNORMAL HIGH (ref 101–111)
GFR calc non Af Amer: >60 mL/min (ref >60)
Glucose, Bld: 126 mg/dL — ABNORMAL HIGH (ref 65–99)
POTASSIUM: 3.4 mmol/L — AB (ref 3.5–5.1)
SODIUM: 145 mmol/L (ref 135–145)

## 2017-06-02 LAB — LACTIC ACID, PLASMA: LACTIC ACID, VENOUS: 0.8 mmol/L (ref 0.5–1.9)

## 2017-06-02 MED ORDER — ROCURONIUM BROMIDE 50 MG/5ML IV SOLN
1.0000 mg/kg | Freq: Once | INTRAVENOUS | Status: AC
  Administered 2017-06-02: 60 mg via INTRAVENOUS
  Filled 2017-06-02: qty 5.35

## 2017-06-02 MED ORDER — LACTATED RINGERS IV SOLN
INTRAVENOUS | Status: DC
  Administered 2017-06-02: 18:00:00 via INTRAVENOUS

## 2017-06-02 MED ORDER — PANTOPRAZOLE SODIUM 40 MG IV SOLR
40.0000 mg | Freq: Every day | INTRAVENOUS | Status: DC
  Administered 2017-06-02 – 2017-06-12 (×11): 40 mg via INTRAVENOUS
  Filled 2017-06-02 (×11): qty 40

## 2017-06-02 MED ORDER — LEVALBUTEROL HCL 0.63 MG/3ML IN NEBU
0.6300 mg | INHALATION_SOLUTION | RESPIRATORY_TRACT | Status: DC | PRN
Start: 2017-06-02 — End: 2017-06-14
  Administered 2017-06-02: 0.63 mg via RESPIRATORY_TRACT
  Filled 2017-06-02: qty 3

## 2017-06-02 MED ORDER — FENTANYL CITRATE (PF) 100 MCG/2ML IJ SOLN
INTRAMUSCULAR | Status: AC
  Administered 2017-06-02: 16:00:00
  Filled 2017-06-02: qty 2

## 2017-06-02 MED ORDER — FENTANYL CITRATE (PF) 100 MCG/2ML IJ SOLN
50.0000 ug | INTRAMUSCULAR | Status: AC | PRN
  Administered 2017-06-02 (×3): 50 ug via INTRAVENOUS
  Filled 2017-06-02 (×3): qty 2

## 2017-06-02 MED ORDER — MIDAZOLAM HCL 2 MG/2ML IJ SOLN
INTRAMUSCULAR | Status: AC
  Administered 2017-06-02: 1 mg
  Filled 2017-06-02: qty 2

## 2017-06-02 MED ORDER — SENNA 8.6 MG PO TABS
1.0000 | ORAL_TABLET | Freq: Two times a day (BID) | ORAL | Status: DC
  Administered 2017-06-02 – 2017-06-14 (×20): 8.6 mg
  Filled 2017-06-02 (×20): qty 1

## 2017-06-02 MED ORDER — LACTATED RINGERS IV SOLN
INTRAVENOUS | Status: DC
  Administered 2017-06-02 – 2017-06-09 (×3): via INTRAVENOUS

## 2017-06-02 MED ORDER — CHLORHEXIDINE GLUCONATE 0.12 % MT SOLN
OROMUCOSAL | Status: AC
  Administered 2017-06-02: 15 mL via OROMUCOSAL
  Filled 2017-06-02: qty 15

## 2017-06-02 MED ORDER — LACTATED RINGERS IV BOLUS (SEPSIS)
1000.0000 mL | Freq: Once | INTRAVENOUS | Status: AC
  Administered 2017-06-02: 1000 mL via INTRAVENOUS

## 2017-06-02 MED ORDER — CHLORHEXIDINE GLUCONATE 0.12% ORAL RINSE (MEDLINE KIT)
15.0000 mL | Freq: Two times a day (BID) | OROMUCOSAL | Status: DC
  Administered 2017-06-02 – 2017-06-14 (×24): 15 mL via OROMUCOSAL

## 2017-06-02 MED ORDER — ETOMIDATE 2 MG/ML IV SOLN
0.3000 mg/kg | Freq: Once | INTRAVENOUS | Status: AC
  Administered 2017-06-02: 20 mg via INTRAVENOUS

## 2017-06-02 MED ORDER — FENTANYL CITRATE (PF) 100 MCG/2ML IJ SOLN
50.0000 ug | INTRAMUSCULAR | Status: DC | PRN
  Administered 2017-06-03 – 2017-06-04 (×12): 50 ug via INTRAVENOUS
  Filled 2017-06-02 (×12): qty 2

## 2017-06-02 MED ORDER — DEXMEDETOMIDINE HCL IN NACL 200 MCG/50ML IV SOLN: 0.0000 ug/kg/h | INTRAVENOUS | Status: DC

## 2017-06-02 MED ORDER — DEXMEDETOMIDINE HCL IN NACL 200 MCG/50ML IV SOLN
0.4000 ug/kg/h | INTRAVENOUS | Status: DC
  Administered 2017-06-02: 0.4 ug/kg/h via INTRAVENOUS
  Administered 2017-06-03: 1.2 ug/kg/h via INTRAVENOUS
  Administered 2017-06-03: 0.5 ug/kg/h via INTRAVENOUS
  Administered 2017-06-03 (×3): 1.2 ug/kg/h via INTRAVENOUS
  Administered 2017-06-03: 0.4 ug/kg/h via INTRAVENOUS
  Administered 2017-06-04: 0.9 ug/kg/h via INTRAVENOUS
  Administered 2017-06-04: 1.1 ug/kg/h via INTRAVENOUS
  Administered 2017-06-04 (×2): 1 ug/kg/h via INTRAVENOUS
  Administered 2017-06-04: 0.9 ug/kg/h via INTRAVENOUS
  Administered 2017-06-04 (×2): 1.2 ug/kg/h via INTRAVENOUS
  Administered 2017-06-05: 0.5 ug/kg/h via INTRAVENOUS
  Administered 2017-06-05: 0.6 ug/kg/h via INTRAVENOUS
  Administered 2017-06-06: 1 ug/kg/h via INTRAVENOUS
  Administered 2017-06-06 (×2): 0.6 ug/kg/h via INTRAVENOUS
  Administered 2017-06-07: 0.4 ug/kg/h via INTRAVENOUS
  Administered 2017-06-07: 0.7 ug/kg/h via INTRAVENOUS
  Administered 2017-06-07: 0.4 ug/kg/h via INTRAVENOUS
  Administered 2017-06-08 (×2): 0.8 ug/kg/h via INTRAVENOUS
  Administered 2017-06-08: 0.6 ug/kg/h via INTRAVENOUS
  Administered 2017-06-08: 0.8 ug/kg/h via INTRAVENOUS
  Administered 2017-06-09: 0.9 ug/kg/h via INTRAVENOUS
  Administered 2017-06-09: 0.4 ug/kg/h via INTRAVENOUS
  Administered 2017-06-09 (×2): 0.9 ug/kg/h via INTRAVENOUS
  Administered 2017-06-10 (×2): 0.6 ug/kg/h via INTRAVENOUS
  Filled 2017-06-02 (×26): qty 50
  Filled 2017-06-02: qty 100
  Filled 2017-06-02 (×3): qty 50
  Filled 2017-06-02: qty 100

## 2017-06-02 MED ORDER — ASPIRIN 81 MG PO CHEW
81.0000 mg | CHEWABLE_TABLET | Freq: Every day | ORAL | Status: DC
  Administered 2017-06-02 – 2017-06-14 (×13): 81 mg
  Filled 2017-06-02 (×13): qty 1

## 2017-06-02 MED ORDER — ORAL CARE MOUTH RINSE
15.0000 mL | Freq: Four times a day (QID) | OROMUCOSAL | Status: DC
  Administered 2017-06-02 – 2017-06-14 (×43): 15 mL via OROMUCOSAL

## 2017-06-02 MED ORDER — ATORVASTATIN CALCIUM 40 MG PO TABS
40.0000 mg | ORAL_TABLET | Freq: Every day | ORAL | Status: DC
  Administered 2017-06-02 – 2017-06-13 (×12): 40 mg
  Filled 2017-06-02 (×12): qty 1

## 2017-06-02 NOTE — Consult Note (Signed)
PULMONARY / CRITICAL CARE MEDICINE   Name: Patrick Carr MRN: [id number] DOB: 07-10-1933    ADMISSION DATE:  06/07/2017 CONSULTATION DATE: 1/8  REFERRING MD:  Denton Brick  CHIEF COMPLAINT:  Delirium and hypoxia   HISTORY OF PRESENT ILLNESS:   This is an 84 year old male patient with a history limited stage small cell lung cancer diagnosed in August 2015 status post chemotherapy and radiation and has been on observation since December 2015 with stable imaging.  Admitted 1/5 with several week history of cough, shortness of breath, malaise fatigue and fevers.  Apparently was found altered on the floor by family members which prompted him being brought to the emergency room.  In the emergency room he was found to have what appeared to be right-sided pneumonia, pulse oximetry in the 70s, he was febrile with a white blood cell count of 11,000 he was admitted with a working diagnosis of pneumonia.  Blood cultures were obtained and were positive for pneumococcal pneumonia.  On 1/6 rapid response was called due to patient having new onset atrial fibrillation with rapid ventricular response (heart rate as high as 180s).  She was transferred to the stepdown unit,Therapeutic interventions have included Cardizem infusion, diltiazem drip, and cardiology evaluation.  His atrial fibrillation as of 1/7 is rate since then has been better controlled with cardiology feeling that this is more likely to be multifocal tachycardia.  He had remained encephalopathic and agitated at times requiring intermittent as needed benzodiazepines.  As of 1/8 the patient is minimally verbally responsive, he has been advanced from high flow nasal cannula to nonrebreather and most recent chest x-ray demonstrates marked right greater than left airspace disease with possible right sided effusion superimposed on top of infiltrate/atelectasis.  Because of high oxygen requirements as well as ongoing delirium and concern about lack of clinical  improvement critical care team is been asked to evaluate by the medicine service.  PAST MEDICAL HISTORY :  He  has a past medical history of BPH (benign prostatic hyperplasia), Cancer ([place]), Hernia, inguinal (left ), History of blood transfusion, History of kidney stones, Hyperlipidemia, Hypertension, Irregular heart rate, Lesion of right lung, Phlebitis (03/20/14), Radiation (02/08/14-03/21/14), RBBB, and Renal cysts, acquired, bilateral.  PAST SURGICAL HISTORY: He  has a past surgical history that includes Sinus artery surgery; No past surgeries; Video bronchoscopy with endobronchial navigation (N/A, 01/18/2014); Video bronchoscopy with endobronchial ultrasound (N/A, 01/18/2014); Video bronchoscopy (N/A, 07/04/2014); and ir generic historical (02/13/2016).  Allergies  Allergen Reactions  . Contrast Media [Iodinated Diagnostic Agents]   . Other Other (See Comments)    Pneumonia vaccine causes arm swelling, caused redness  . Pneumococcal Vaccines     Arm swelling    Current Facility-Administered Medications on File Prior to Encounter  Medication  . sodium chloride 0.9 % injection 10 mL  . sodium chloride 0.9 % injection 10 mL   Current Outpatient Medications on File Prior to Encounter  Medication Sig  . alfuzosin (UROXATRAL) 10 MG 24 hr tablet Take 10 mg by mouth at bedtime.   . ALPRAZolam (XANAX) 0.25 MG tablet Take 1 tablet (0.25 mg total) by mouth at bedtime as needed for anxiety.  [name] aspirin 81 MG tablet Take 81 mg by mouth at bedtime.   [name] atorvastatin (LIPITOR) 40 MG tablet Take 40 mg by mouth at bedtime.   [name] losartan (COZAAR) 100 MG tablet Take 100 mg by mouth at bedtime.     FAMILY HISTORY:  His indicated that his mother is deceased. He indicated  that his father is deceased.   SOCIAL HISTORY: He  reports that he quit smoking about 3 years ago. He has a 31.00 pack-year smoking history. he has never used smokeless tobacco. He reports that he does not drink alcohol or use  drugs.  REVIEW OF SYSTEMS:   Unable  SUBJECTIVE:  Sedated  VITAL SIGNS: BP (!) 124/56   Pulse (!) 112   Temp 97.6 F (36.4 C) (Axillary)   Resp (!) 26   Ht 5\' 5"  (1.651 m)   Wt 117 lb 15.1 oz (53.5 kg)   SpO2 91%   BMI 19.63 kg/m    HEMODYNAMICS:    VENTILATOR SETTINGS:    INTAKE / OUTPUT: I/O last 3 completed shifts: In: 3205 [I.V.:2905; IV Piggyback:300] Out: 950 [Urine:950]  PHYSICAL EXAMINATION: General: Elderly 84 year old male resting comfortably in bed did receive benzodiazepines recently Neuro: Slow to arouse intermittently agitated moves all extremities HEENT: Normocephalic atraumatic no jugular venous distention Cardiovascular: Tacky irregular no murmur rub or gallop Lungs: Scattered rhonchi decreased on right base Abdomen: Soft nontender Musculoskeletal: Equal strength and bulk Skin: [place] areas of ecchymosis no edema  LABS:  BMET Recent Labs  Lab 05/31/17 0606 06/01/17 0947 06/02/17 0827  NA 141 144 145  K 3.7 3.5 3.4*  CL 110 116* 117*  CO2 23 22 23   BUN 44* 34* 28*  CREATININE 1.37* 0.88 0.85  GLUCOSE 101* 92 126*    Electrolytes Recent Labs  Lab 05/31/17 0606 06/01/17 0947 06/02/17 0827  CALCIUM 7.9* 7.9* 8.2*  MG  --  1.9  --     CBC Recent Labs  Lab 05/31/17 0606 06/01/17 0947 06/02/17 0827  WBC 5.9 6.4 6.1  HGB 10.8* 10.6* 10.8*  HCT 32.4* 33.1* 33.7*  PLT 97* 75* 78*    Coag's No results for input(s): APTT, INR in the last 168 hours.  Sepsis Markers Recent Labs  Lab 05/31/2017 1707 06/19/2017 2111 06/06/2017 2358  LATICACIDVEN 3.95* 2.2* 1.7    ABG Recent Labs  Lab 06/09/2017 1630 05/31/17 1438  PHART 7.292* 7.356  PCO2ART 48.6* 42.7  PO2ART 114* 57.5*    Liver Enzymes No results for input(s): AST, ALT, ALKPHOS, BILITOT, ALBUMIN in the last 168 hours.  Cardiac Enzymes Recent Labs  Lab 06/02/2017 2111 05/27/2017 2358 05/31/17 0606  TROPONINI 0.04* 0.03* <0.03    Glucose Recent Labs  Lab  06/05/2017 1446  GLUCAP 75    Imaging No results found.   STUDIES:  CT head 1/5: Negative  CULTURES: Blood cultures 1/5 positive for Streptococcus pneumoniae  ANTIBIOTICS: 8 azithromycin 1/5 Rocephin 1/5  SIGNIFICANT EVENTS:   LINES/TUBES:   DISCUSSION: 84 year old male patient with pneumococcal pneumonia and bacteremia course complicated by acute agitated delirium superimposed on underlying dementia still on high flow oxygen.  Appears to have element of effusion however ultrasound at bedside without clear layering component plan CT chest evaluate for loculated effusion, to new antibiotics, continue supplemental oxygen, discontinue benzodiazepine as needed Precedex continue supportive care.  ASSESSMENT / PLAN:  Acute hypoxic respiratory failure in the setting of pneumococcal pneumonia Portable chest x-ray reviewed: Demonstrate right greater than left airspace disease appears to have element of right effusion Bedside ultrasound does appear as though there is some pockets of fluid however does not appear free-flowing Plan Continue supplemental oxygen Careful with sedation, need to avoid depressing respiratory drive Pulmonary hygiene Pulse oximetry CT chest, evaluating for complicated pleural effusion Continue antibiotics day #4 a azithromycin and Rocephin If CT chest demonstrates significant effusion  a need chest tube placement, particularly if loculated  Sirs/sepsis in the setting of pneumococcal bacteremia and pneumonia Plan Continue telemetry monitoring Gentle IV hydration Antibiotics as above Repeat LA  Acute encephalopathy superimposed on underlying dementia -He is on chronic Xanax however low dosing and as needed.  Seems oversedated with lorazepam Plan Discontinue lorazepam As needed Precedex  Multifocal atrial tachycardia Plan Continue telemetry monitoring, supportive care Further recommendations per cardiology  Fluid and electrolyte balance: Mild  hyperchloremia and IPO kalemia Plan Change IV fluids to LR Replace potassium  Anemia of chronic disease Plan Trend CBC Subcu heparin   DVT prophylaxis: [place] heparin  SUP: NA  Diet: NPO  Activity: bedrest Disposition : ICU  Erick Colace ACNP-BC [place] Pager # [phone] OR # [phone] if no answer   06/02/2017, 10:32 AM

## 2017-06-02 NOTE — Progress Notes (Signed)
   06/02/17 1500  Clinical Encounter Type  Visited With Patient and family together  Visit Type Initial;Psychological support;Spiritual support;Critical Care  Referral From Nurse  Consult/Referral To Chaplain  Spiritual Encounters  Spiritual Needs Prayer;Emotional;Other (Comment) (Spiritual Care conversation/Support)  Stress Factors  Patient Stress Factors Not reviewed  Family Stress Factors Health changes;Major life changes   I visited with the patient and his family per referral from the nurse.  The patient's daughter requested that I pray with the patient before he was intubated. Family gathered around the bedside and we prayed.  The daughter stated that she takes comfort in knowing that the patient is "saved."  They had no other needs at this time.   Please, contact Spiritual Care for further assistance.   [place] M.Div.

## 2017-06-02 NOTE — Procedures (Signed)
Intubation Procedure Note WESTLEY BLASS [id number] 01-28-1934  Procedure: Intubation Indications: Respiratory insufficiency  Procedure Details Consent: Risks of procedure as well as the alternatives and risks of each were explained to the (patient/caregiver).  Consent for procedure obtained. Time Out: Verified patient identification, verified procedure, site/side was marked, verified correct patient position, special equipment/implants available, medications/allergies/relevent history reviewed, required imaging and test results available.  Performed  Maximum sterile technique was used including antiseptics, cap, gloves, hand hygiene and mask.  MAC size 4  Evaluation Hemodynamic Status: BP stable throughout; O2 sats: stable throughout Patient's Current Condition: stable Complications: No apparent complications Patient did tolerate procedure well. Chest X-ray ordered to verify placement.  CXR: pending.   Clementeen Graham 06/02/2017  Attending:  Present for and supervised  Roselie Awkward, MD [place] Pager: [phone] Cell: [phone] After 3pm or if no response, call [phone]

## 2017-06-02 NOTE — Progress Notes (Signed)
Progress Note  Patient Name: Patrick Carr Date of Encounter: 06/02/2017  Primary Cardiologist: Kirk Ruths, MD   Subjective   Unable to answer questions after ativan. Moans but not arouseable.   Inpatient Medications    Scheduled Meds: . alfuzosin  10 mg Oral QHS  . aspirin EC  81 mg Oral QHS  . atorvastatin  40 mg Oral QHS  . guaiFENesin  600 mg Oral BID  . heparin  5,000 Units Subcutaneous Q8H  . levalbuterol  0.63 mg Nebulization TID  . senna  1 tablet Oral BID  . sodium chloride flush  3 mL Intravenous Q12H   Continuous Infusions: . sodium chloride    . sodium chloride 100 mL/hr at 06/01/17 0900  . azithromycin 500 mg (06/01/17 1726)  . cefTRIAXone (ROCEPHIN)  IV Stopped (06/01/17 1720)  . diltiazem (CARDIZEM) infusion 7.5 mg/hr (06/02/17 0505)   PRN Meds: sodium chloride, acetaminophen **OR** acetaminophen, ALPRAZolam, hydrALAZINE, levalbuterol, ondansetron **OR** ondansetron (ZOFRAN) IV, polyethylene glycol, sodium chloride flush, traMADol, traZODone   Vital Signs    Vitals:   06/02/17 0400 06/02/17 0500 06/02/17 0502 06/02/17 0600  BP: 119/64   120/64  Pulse: (!) 56 99  (!) 54  Resp: (!) 24 (!) 23  16  Temp:      TempSrc:      SpO2: (!) 89% 97% 97% 90%  Weight:      Height:        Intake/Output Summary (Last 24 hours) at 06/02/2017 0740 Last data filed at 06/01/2017 1802 Gross per 24 hour  Intake 1941.96 ml  Output 400 ml  Net 1541.96 ml   Filed Weights   05/31/2017 1437 06/09/2017 2000 05/31/17 2000  Weight: 120 lb (54.4 kg) 111 lb 12.4 oz (50.7 kg) 117 lb 15.1 oz (53.5 kg)    Telemetry    Sinus tach vs multifocal tachycardia with HR 110s, occasionally up to 130s, with frequent PACs - Personally Reviewed  ECG    EKG 05/31/2017 sinus tachycardia of unknown origin with RBBB - Personally Reviewed  Physical Exam    GEN: sedated Neck: No JVD Cardiac: irregular tachycardic, no murmurs, rubs, or gallops.  Respiratory: bilateral rhonchi and  expiratory wheezing GI: Soft, nontender, non-distended  MS: No edema; No deformity. Neuro:  Unable to assess  Psych: Normal affect   Labs    Chemistry Recent Labs  Lab 06/24/2017 1453 05/31/17 0606 06/01/17 0947  NA 140 141 144  K 4.0 3.7 3.5  CL 102 110 116*  CO2 24 23 22   GLUCOSE 104* 101* 92  BUN 39* 44* 34*  CREATININE 1.97* 1.37* 0.88  CALCIUM 9.1 7.9* 7.9*  GFRNONAA 30* 47* >60  GFRAA 35* 54* >60  ANIONGAP 14 8 6      Hematology Recent Labs  Lab 06/18/2017 1453 05/31/17 0606 06/01/17 0947  WBC 11.0* 5.9 6.4  RBC 4.41 3.32* 3.29*  HGB 14.6 10.8* 10.6*  HCT 43.4 32.4* 33.1*  MCV 98.4 97.6 100.6*  MCH 33.1 32.5 32.2  MCHC 33.6 33.3 32.0  RDW 14.3 14.4 14.8  PLT 136* 97* 75*    Cardiac Enzymes Recent Labs  Lab 06/08/2017 2111 05/28/2017 2358 05/31/17 0606  TROPONINI 0.04* 0.03* <0.03   No results for input(s): TROPIPOC in the last 168 hours.   BNPNo results for input(s): BNP, PROBNP in the last 168 hours.   DDimer No results for input(s): DDIMER in the last 168 hours.   Radiology    Dg Chest [facility]  Result Date: 06/01/2017 CLINICAL DATA:  84 year old male with shortness of breath. History of right-sided lung cancer. Subsequent encounter. EXAM: PORTABLE CHEST 1 VIEW COMPARISON:  05/31/2017 and 06/20/2017 chest x-ray. 01/30/2017 chest CT. FINDINGS: Right mid to lower lobe consolidation with right-sided pleural effusion. Although it is possible this represents result of infectious infiltrate, per prior CT, patient had right-sided lung cancer with treated neoplasm superior segment right lower lobe. Recurrence therefore not excluded. If further delineation is clinically desired, chest CT can be obtained. Alternatively, treatment of any clinically suspected infectious infiltrate with serial follow-up chest x-rays recommended. Heart size difficult adequately assess. Central pulmonary vascular prominence. Left base subsegmental atelectasis. Biapical pleural scarring  greater on the right. Vascular calcifications including aortic and carotid bifurcation calcifications. IMPRESSION: Similar appearance of consolidation right mid to lower lobe which may represent infiltrate/ atelectasis with pleural effusion but cannot exclude recurrent tumor given patient's history. Follow-up as detailed above. Aortic Atherosclerosis (ICD10-I70.0). Electronically Signed   By: Genia Del M.D.   On: 06/01/2017 08:26   Dg Chest Port 1 View  Result Date: 05/31/2017 CLINICAL DATA:  Shortness of breath today, followup pneumonia EXAM: PORTABLE CHEST 1 VIEW COMPARISON:  Portable exam 1418 hours compared 05/30/2016 FINDINGS: Enlargement of cardiac silhouette. Atherosclerotic calcification aorta. Mediastinal contours and pulmonary vascularity normal. COPD changes with increased infiltrate in the mid to lower RIGHT lung associated with increased RIGHT pleural effusion. Additional in trade in the retrocardiac LEFT lower lobe. Upper lobes clear. No pneumothorax. Bones demineralized. IMPRESSION: Enlargement of cardiac silhouette. Increased atelectasis versus infiltrate in retrocardiac LEFT lower lobe. Increased lower RIGHT lung infiltrate with associated increase in RIGHT pleural effusion. Electronically Signed   By: Lavonia Dana M.D.   On: 05/31/2017 14:36    Cardiac Studies   Echo 05/31/2017 LV EF: 60% -   65%  Study Conclusions  - Left ventricle: The cavity size was normal. Systolic function was   normal. The estimated ejection fraction was in the range of 60%   to 65%. Wall motion was normal; there were no regional wall   motion abnormalities. - Left atrium: The atrium was mildly dilated. - Pulmonary arteries: PA peak pressure: 51 mm Hg (S). - Line: A venous catheter was visualized in the superior vena cava,   with its tip in the right atrium. No abnormal features noted. - Impressions: Difficult study due to rapid HR.  Impressions:  - Difficult study due to rapid HR.  Patient  Profile     84 y.o. male with PMH of lung CA and HTN admitted for pneumonia and sepsis, found to have new questionable afib with RVR vs PAT  Assessment & Plan    1. Sinus rhythm with PACs vs multi focal tachycardia vs less likely atrial fibrillation  - reviewed telemetry, more likely to be sinus rhythm with PACs, no obvious evidence of afib  - Echo 05/31/2017 EF 60-65%, PA peak pressure 100mmHg  - continue IV diltiazem, unable to convert to PO due to mental status. No indication for anticoagulation unless proven recurrence of afib. Need to focus on treating underlying issue with sepsis  2. PNA with sepsis: per medicine  3. Mildly elevated trop: unable to arouse the patient this morning as he received a dose of ativan. Unable to check if has any chest pain, however the minimally elevated trop with flat trend, likely consistent with demand ischemia.   4. AKI: resolved  5. HLD: on lipitor    For questions or updates, please contact [place]  Please consult [email or web address] for contact info under Cardiology/STEMI.      SignedAlmyra Deforest, PA  06/02/2017, 7:40 AM   As above; pt seen and examined; patient is nonrebreather this morning.  He does not respond appropriately to questions concerning dyspnea or chest pain.  I have personally reviewed his telemetry.  He appears to have sinus tachycardia with PAT and PACs.  Continue Cardizem.  I have not seen atrial fibrillation to this point and therefore no indication for anticoagulation.  Continue therapy for pneumonia per internal medicine.  Kirk Ruths, MD

## 2017-06-02 NOTE — Progress Notes (Signed)
Patient Demographics:    Patrick Carr, is a 84 y.o. male, DOB - November 24, 1933, [id number]:[id number]  Admit date - 06/02/2017   Admitting Physician Drina Jobst Denton Brick, MD  Outpatient Primary MD for the patient is Deland Pretty, MD  LOS - 3   Chief Complaint  Patient presents with  . Loss of Consciousness        Subjective:    Patrick Carr today has no fevers, no emesis, tachycardia persist, patient is drowsy/lethargic after Ativan  Assessment  & Plan :    Principal Problem:   Sepsis due to Pulm Source Active Problems:   Hypertension   Small cell carcinoma of right lung ([place])   Community acquired pneumonia   Acute respiratory failure ([place])   CAP (community acquired pneumonia)  Brief Summary:- 84 year old male with past medical history relevant for prior history of right lung small cell carcinoma status post chemotherapy in 2015 admitted on 06/12/2017 with sepsis secondary to pulmonary source/right-sided pneumonia with effusion.  On 05/31/2017 patient was noted to have new onset A. fib with RVR in the setting of sepsis and hypoxia, blood cultures from 06/06/2017 with strep pneumo, now drowsy and lethargic after Ativan.  Confusion and respiratory distress persist, tachycardia persist. PCCM consult on 06/02/2017 appreciated.  Patient intubated and transferred to [facility] service on 06/02/2017  Plan:-  1)Strep Pneumonia Bacteremia Bacteremia and Sepsis secondary to Pulmonary source-  repeat chest x-ray on 06/02/17 with right-sided pneumonia with large pleural effusion,  hypoxia persis white count is down to 5.9 from 11k,   lactic acid elevated at 6 on admission, c/n bronchodilators, mucolytics, supplemental oxygen. C/n IV fluids, IV Rocephin (Abx started 05/30/16) increased to 2 g every 24 hours on 05/31/17), continue azithromycin iv 500 daily. Respiratory status worsened,Patient intubated and transferred to [facility] service on  06/02/2017  2)New Onset Afib with RVR Vs MAT- in the setting of sepsis and hypoxia severe COPD, c.n  IV Cardizem drip for rate control, cardiology advises holding off on anticoagulation at this time mg/h, echocardiogram witn EF > 60 %.  Troponin has been borderline,  TSH is wnl.  Please see full cardiology consult note from 06/01/2017, Patient intubated and transferred to [facility] service on 06/02/2017  3)Acute hypoxic respiratory failure-secondary to #1 and # 2 above treat as above in # 1 and # 2, Patient intubated and transferred to [facility] service on 06/02/2017  4)H/o Rt Lung SCC-last chemotherapy was December 2015, apparently patient has been in remission, Rx acute pneumonia  5)Possible Syncope-suspect patient went down due to significant tachycardia, tachypnea and hypoxia, was found on the floor, the patient denies actually falling,   CT Head is unremarkable, echo noted troponin is borderline.  Patient developed tachyarrhythmia  6)AKI-resolved, creatinine is down to 0.8 from 1.97 on admission,  from a baseline of 1.1, c/n to hold losartan, hydrate IV n.p.o., avoid nephrotoxic agents  7)Social/Ethics-plan of care and advanced directives discussed with patient, his wife and daughter,, patient is a full code, Patient intubated and transferred to [facility] service on 06/02/2017  Code Status : Full  Disposition Plan  : TBD  Consults  :  PCCM  DVT Prophylaxis  :   Heparin  Lab Results  Component Value Date   PLT 78 (L) 06/02/2017  Inpatient Medications  Scheduled Meds: . aspirin  81 mg Per Tube Daily  . atorvastatin  40 mg Per Tube QHS  . chlorhexidine gluconate (MEDLINE KIT)  15 mL Mouth Rinse BID  . fentaNYL      . heparin  5,000 Units Subcutaneous Q8H  . levalbuterol  0.63 mg Nebulization TID  . mouth rinse  15 mL Mouth Rinse QID  . midazolam      . pantoprazole (PROTONIX) IV  40 mg Intravenous Daily  . senna  1 tablet Per Tube BID  . sodium chloride flush  3 mL Intravenous Q12H    Continuous Infusions: . sodium chloride    . azithromycin 500 mg (06/02/17 1639)  . cefTRIAXone (ROCEPHIN)  IV 2 g (06/02/17 1738)  . dexmedetomidine (PRECEDEX) IV infusion    . lactated ringers 1,000 mL (06/02/17 1600)  . lactated ringers 75 mL/hr at 06/02/17 1141  . lactated ringers 100 mL/hr at 06/02/17 1800   PRN Meds:.sodium chloride, fentaNYL (SUBLIMAZE) injection, fentaNYL (SUBLIMAZE) injection, hydrALAZINE, levalbuterol, polyethylene glycol, sodium chloride flush    Anti-infectives (From admission, onward)   Start     Dose/Rate Route Frequency Ordered Stop   05/31/17 1600  cefTRIAXone (ROCEPHIN) 2 g in dextrose 5 % 50 mL IVPB     2 g 100 mL/hr over 30 Minutes Intravenous Every 24 hours 05/31/17 1304     06/16/2017 1600  azithromycin (ZITHROMAX) 500 mg in dextrose 5 % 250 mL IVPB     500 mg 250 mL/hr over 60 Minutes Intravenous Every 24 hours 06/10/2017 1508     05/29/2017 1600  cefTRIAXone (ROCEPHIN) 1 g in dextrose 5 % 50 mL IVPB  Status:  Discontinued     1 g 100 mL/hr over 30 Minutes Intravenous Every 24 hours 05/29/2017 1508 05/31/17 1304   06/19/2017 1515  cefTRIAXone (ROCEPHIN) 1 g in dextrose 5 % 50 mL IVPB  Status:  Discontinued     1 g 100 mL/hr over 30 Minutes Intravenous  Once 06/12/2017 1501 06/25/2017 1508   06/04/2017 1515  azithromycin (ZITHROMAX) 500 mg in dextrose 5 % 250 mL IVPB  Status:  Discontinued     500 mg 250 mL/hr over 60 Minutes Intravenous  Once 06/22/2017 1501 06/23/2017 1508        Objective:   Vitals:   06/02/17 1310 06/02/17 1636 06/02/17 1639 06/02/17 1721  BP:  132/79 (!) 137/48 (!) 119/57  Pulse:  (!) 40 (!) 58 87  Resp:  14 (!) 27 16  Temp: (!) 97.1 F (36.2 C) (!) 95.5 F (35.3 C) (!) 95.7 F (35.4 C) (!) 96.3 F (35.7 C)  TempSrc: Oral     SpO2:  100% 100% 100%  Weight:      Height:        Wt Readings from Last 3 Encounters:  05/31/17 53.5 kg (117 lb 15.1 oz)  02/02/17 55.5 kg (122 lb 4.8 oz)  08/04/16 56.4 kg (124 lb 4 oz)      Intake/Output Summary (Last 24 hours) at 06/02/2017 1740 Last data filed at 06/02/2017 1700 Gross per 24 hour  Intake 2715.55 ml  Output 400 ml  Net 2315.55 ml     Physical Exam  Gen:- Awake , was sleepy after Ativan,subsequently later in the day intubated and sedated HEENT:- [place].AT, No sclera icterus Neck-Supple Neck,No JVD,.  Lungs-  Diminished especially  on the right with scattered rhonchi rales CV- S1, S2 normal,  irregular with heart rate in the 110s Abd-  +  ve B.Sounds, Abd Soft, No tenderness,    Extremity/Skin:- No  edema,    Psych-sleepy on and off after Ativan Neuro-lethargic area, subsequently later in the day intubated and sedated   Data Review:   Micro Results Recent Results (from the past 240 hour(s))  Blood culture (routine x 2)     Status: Abnormal   Collection Time: 05/31/2017  2:53 PM  Result Value Ref Range Status   Specimen Description BLOOD RIGHT FOREARM  Final   Special Requests   Final    BOTTLES DRAWN AEROBIC AND ANAEROBIC Blood Culture adequate volume   Culture  Setup Time   Final    GRAM POSITIVE COCCI IN BOTH AEROBIC AND ANAEROBIC BOTTLES CRITICAL RESULT CALLED TO, READ BACK BY AND VERIFIED WITH: E WILLIAMSON,PHARMD AT 1232 05/31/17 BY L BENFIELD Performed at [facility], [place] [address] [place], [place] [address]    Culture STREPTOCOCCUS PNEUMONIAE (A)  Final   Report Status 06/02/2017 FINAL  Final   Organism ID, Bacteria STREPTOCOCCUS PNEUMONIAE  Final      Susceptibility   Streptococcus pneumoniae - MIC*    LEVOFLOXACIN 1 SENSITIVE Sensitive     PENICILLIN (meningitis) <=0.06 SENSITIVE Sensitive     PENICILLIN (non-meningitis) <=0.06 SENSITIVE Sensitive     CEFTRIAXONE (non-meningitis) <=0.12 SENSITIVE Sensitive     CEFTRIAXONE (meningitis) <=0.12 SENSITIVE Sensitive     * STREPTOCOCCUS PNEUMONIAE  Blood Culture ID Panel (Reflexed)     Status: Abnormal   Collection Time: 06/04/2017  2:53 PM  Result Value Ref Range Status    Enterococcus species NOT DETECTED NOT DETECTED Final   Listeria monocytogenes NOT DETECTED NOT DETECTED Final   Staphylococcus species NOT DETECTED NOT DETECTED Final   Staphylococcus aureus NOT DETECTED NOT DETECTED Final   Streptococcus species DETECTED (A) NOT DETECTED Final    Comment: CRITICAL RESULT CALLED TO, READ BACK BY AND VERIFIED WITH: E WILLIAMSON,PHARMD AT 1232 05/31/17 BY L BENFIELD    Streptococcus agalactiae NOT DETECTED NOT DETECTED Final   Streptococcus pneumoniae DETECTED (A) NOT DETECTED Final    Comment: CRITICAL RESULT CALLED TO, READ BACK BY AND VERIFIED WITH: E WILLIAMSON,PHARMD AT 1232 05/31/17 BY L BENFIELD    Streptococcus pyogenes NOT DETECTED NOT DETECTED Final   Acinetobacter baumannii NOT DETECTED NOT DETECTED Final   Enterobacteriaceae species NOT DETECTED NOT DETECTED Final   Enterobacter cloacae complex NOT DETECTED NOT DETECTED Final   Escherichia coli NOT DETECTED NOT DETECTED Final   Klebsiella oxytoca NOT DETECTED NOT DETECTED Final   Klebsiella pneumoniae NOT DETECTED NOT DETECTED Final   Proteus species NOT DETECTED NOT DETECTED Final   Serratia marcescens NOT DETECTED NOT DETECTED Final   Haemophilus influenzae NOT DETECTED NOT DETECTED Final   Neisseria meningitidis NOT DETECTED NOT DETECTED Final   Pseudomonas aeruginosa NOT DETECTED NOT DETECTED Final   Candida albicans NOT DETECTED NOT DETECTED Final   Candida glabrata NOT DETECTED NOT DETECTED Final   Candida krusei NOT DETECTED NOT DETECTED Final   Candida parapsilosis NOT DETECTED NOT DETECTED Final   Candida tropicalis NOT DETECTED NOT DETECTED Final    Comment: Performed at [facility], [address] [place],  [address]  Blood culture (routine x 2)     Status: Abnormal   Collection Time: 06/19/2017  3:01 PM  Result Value Ref Range Status   Specimen Description BLOOD LEFT ANTECUBITAL  Final   Special Requests   Final    BOTTLES DRAWN AEROBIC AND ANAEROBIC Blood Culture  adequate volume   Culture  Setup Time   Final    GRAM POSITIVE COCCI IN PAIRS IN CHAINS IN BOTH AEROBIC AND ANAEROBIC BOTTLES CRITICAL VALUE NOTED.  VALUE IS CONSISTENT WITH PREVIOUSLY REPORTED AND CALLED VALUE.    Culture (A)  Final    STREPTOCOCCUS PNEUMONIAE SUSCEPTIBILITIES PERFORMED ON PREVIOUS CULTURE WITHIN THE LAST 5 DAYS. Performed at [facility], [place] [address], [place], [place] [address]    Report Status 06/02/2017 FINAL  Final  Respiratory Panel by PCR     Status: None   Collection Time: 06/24/2017  4:05 PM  Result Value Ref Range Status   Adenovirus NOT DETECTED NOT DETECTED Final   Coronavirus 229E NOT DETECTED NOT DETECTED Final   Coronavirus HKU1 NOT DETECTED NOT DETECTED Final   Coronavirus NL63 NOT DETECTED NOT DETECTED Final   Coronavirus OC43 NOT DETECTED NOT DETECTED Final   Metapneumovirus NOT DETECTED NOT DETECTED Final   Rhinovirus / Enterovirus NOT DETECTED NOT DETECTED Final   Influenza A NOT DETECTED NOT DETECTED Final   Influenza B NOT DETECTED NOT DETECTED Final   Parainfluenza Virus 1 NOT DETECTED NOT DETECTED Final   Parainfluenza Virus 2 NOT DETECTED NOT DETECTED Final   Parainfluenza Virus 3 NOT DETECTED NOT DETECTED Final   Parainfluenza Virus 4 NOT DETECTED NOT DETECTED Final   Respiratory Syncytial Virus NOT DETECTED NOT DETECTED Final   Bordetella pertussis NOT DETECTED NOT DETECTED Final   Chlamydophila pneumoniae NOT DETECTED NOT DETECTED Final   Mycoplasma pneumoniae NOT DETECTED NOT DETECTED Final    Comment: Performed at [facility], [place] [address], [place], [place] [address]  MRSA PCR Screening     Status: None   Collection Time: 05/31/17  8:28 PM  Result Value Ref Range Status   MRSA by PCR NEGATIVE NEGATIVE Final    Comment:        The GeneXpert MRSA Assay (FDA approved for NASAL specimens only), is one component of a comprehensive MRSA colonization surveillance program. It is not intended to diagnose MRSA infection  nor to guide or monitor treatment for MRSA infections.     Radiology Reports Dg Chest 2 View  Result Date: 06/03/2017 CLINICAL DATA:  Cough and fever for several days. EXAM: CHEST  2 VIEW COMPARISON:  01/30/2017 chest CT and prior studies FINDINGS: Airspace disease throughout the right lower lobe is compatible with pneumonia. Upper limits normal heart size noted. Fullness of the right hilar region is grossly unchanged. No pleural effusion or pneumothorax. Right apical pleuroparenchymal opacities/scarring again noted. IMPRESSION: Diffuse right lower lobe airspace disease compatible with pneumonia. Radiographic follow-up to resolution is recommended. Electronically Signed   By: Margarette Canada M.D.   On: 06/19/2017 16:03   Ct Head Wo Contrast  Result Date: 06/17/2017 CLINICAL DATA:  Altered level of consciousness/unresponsive EXAM: CT HEAD WITHOUT CONTRAST TECHNIQUE: Contiguous axial images were obtained from the base of the skull through the vertex without intravenous contrast. COMPARISON:  MRI brain dated 01/13/2014 FINDINGS: Brain: No evidence of acute infarction, hemorrhage, hydrocephalus, extra-axial collection or mass lesion/mass effect. Mild cortical atrophy. Subcortical white matter and periventricular small vessel ischemic changes. Vascular: Mild intracranial atherosclerosis. Skull: Normal. Negative for fracture or focal lesion. Sinuses/Orbits: The visualized paranasal sinuses are essentially clear. The mastoid air cells are unopacified. Other: None. IMPRESSION: No evidence of acute intracranial abnormality. Mild atrophy with small vessel ischemic changes. Electronically Signed   By: Julian Hy M.D.   On: 06/08/2017 15:37   Ct  Chest Wo Contrast  Result Date: 06/02/2017 CLINICAL DATA:  Right lung pneumonia. EXAM: CT CHEST WITHOUT CONTRAST TECHNIQUE: Multidetector CT imaging of the chest was performed following the standard protocol without IV contrast. COMPARISON:  08/01/2016 FINDINGS:  Cardiovascular: Heart size upper normal. No substantial pericardial effusion. Atherosclerotic calcification is noted in the wall of the thoracic aorta. Mediastinum/Nodes: 9 mm short axis precarinal lymph node. No bulky left hilar lymphadenopathy. Abnormal soft tissue is noted in the right hilar region. The esophagus has normal imaging features. There is no axillary lymphadenopathy. Lungs/Pleura: 2.4 x 1.6 cm spiculated right apical nodule is similar to prior. Underlying changes of centrilobular emphysema noted. Fine lung detail obscured by breathing motion. There is dense right lower and middle mild dependent collapse/consolidation is seen in the left lower lobe and posterior left upper lobe small left pleural effusion evident. Lobe collapse/consolidation with small right pleural effusion Upper Abdomen: Cysts of varying size and attenuation are seen in the kidneys bilaterally. These appear similar to the prior study. Some of these cysts measure water attenuation compatible with simple cysts. Some of these lesions have attenuation higher than expected for simple cysts (11 mm left upper pole hyperdense lesion on image 139 series 2). While they cannot be completely characterized, hemorrhagic or proteinaceous cysts would be a consideration. Musculoskeletal: Bone windows reveal no worrisome lytic or sclerotic osseous lesions. IMPRESSION: 1. Interval development of dense airspace consolidation in the right middle and lower lobes compatible with pneumonia. The previously described treated neoplasm in the superior segment of the right lower lobe is been incorporated into the airspace consolidation on the current study. 2. Mild dependent atelectasis/infiltrate in the left upper and lower lobes. 3. Small bilateral pleural effusions, right greater than left. 4.  Emphysema. (ICD10-J43.9) 5.  Aortic Atherosclerois (ICD10-170.0) Electronically Signed   By: Misty Stanley M.D.   On: 06/02/2017 13:00   Dg Chest Port 1  View  Result Date: 06/02/2017 CLINICAL DATA:  Acute respiratory failure EXAM: PORTABLE CHEST 1 VIEW COMPARISON:  06/01/2017 chest x-ray, 06/02/2017 chest CT FINDINGS: Endotracheal tube and nasogastric catheter are noted in satisfactory position new from the prior exam. Right lower lobe consolidation is noted similar to that seen on chest CT. Pleural effusion is noted as well. The overall appearance is relatively stable from the prior exam. Left lung is clear. Chronic changes are noted in the right apex consistent with known treated neoplasm. IMPRESSION: Persistent right lower lobe infiltrate and effusion. The overall appearance is similar to that seen on recent CT. Endotracheal tube and nasogastric catheter in satisfactory position. Electronically Signed   By: Inez Catalina M.D.   On: 06/02/2017 16:28   Dg Chest Port 1 View  Result Date: 06/01/2017 CLINICAL DATA:  84 year old male with shortness of breath. History of right-sided lung cancer. Subsequent encounter. EXAM: PORTABLE CHEST 1 VIEW COMPARISON:  05/31/2017 and 06/18/2017 chest x-ray. 01/30/2017 chest CT. FINDINGS: Right mid to lower lobe consolidation with right-sided pleural effusion. Although it is possible this represents result of infectious infiltrate, per prior CT, patient had right-sided lung cancer with treated neoplasm superior segment right lower lobe. Recurrence therefore not excluded. If further delineation is clinically desired, chest CT can be obtained. Alternatively, treatment of any clinically suspected infectious infiltrate with serial follow-up chest x-rays recommended. Heart size difficult adequately assess. Central pulmonary vascular prominence. Left base subsegmental atelectasis. Biapical pleural scarring greater on the right. Vascular calcifications including aortic and carotid bifurcation calcifications. IMPRESSION: Similar appearance of consolidation right mid to lower lobe  which may represent infiltrate/ atelectasis with pleural  effusion but cannot exclude recurrent tumor given patient's history. Follow-up as detailed above. Aortic Atherosclerosis (ICD10-I70.0). Electronically Signed   By: Genia Del M.D.   On: 06/01/2017 08:26   Dg Chest Port 1 View  Result Date: 05/31/2017 CLINICAL DATA:  Shortness of breath today, followup pneumonia EXAM: PORTABLE CHEST 1 VIEW COMPARISON:  Portable exam 1418 hours compared 05/30/2016 FINDINGS: Enlargement of cardiac silhouette. Atherosclerotic calcification aorta. Mediastinal contours and pulmonary vascularity normal. COPD changes with increased infiltrate in the mid to lower RIGHT lung associated with increased RIGHT pleural effusion. Additional in trade in the retrocardiac LEFT lower lobe. Upper lobes clear. No pneumothorax. Bones demineralized. IMPRESSION: Enlargement of cardiac silhouette. Increased atelectasis versus infiltrate in retrocardiac LEFT lower lobe. Increased lower RIGHT lung infiltrate with associated increase in RIGHT pleural effusion. Electronically Signed   By: Lavonia Dana M.D.   On: 05/31/2017 14:36     CBC Recent Labs  Lab 06/21/2017 1453 05/31/17 0606 06/01/17 0947 06/02/17 0827  WBC 11.0* 5.9 6.4 6.1  HGB 14.6 10.8* 10.6* 10.8*  HCT 43.4 32.4* 33.1* 33.7*  PLT 136* 97* 75* 78*  MCV 98.4 97.6 100.6* 101.2*  MCH 33.1 32.5 32.2 32.4  MCHC 33.6 33.3 32.0 32.0  RDW 14.3 14.4 14.8 15.1    Chemistries  Recent Labs  Lab 06/15/2017 1453 05/31/17 0606 06/01/17 0947 06/02/17 0827  NA 140 141 144 145  K 4.0 3.7 3.5 3.4*  CL 102 110 116* 117*  CO2 '24 23 22 23  '$ GLUCOSE 104* 101* 92 126*  BUN 39* 44* 34* 28*  CREATININE 1.97* 1.37* 0.88 0.85  CALCIUM 9.1 7.9* 7.9* 8.2*  MG  --   --  1.9  --    ------------------------------------------------------------------------------------------------------------------ No results for input(s): CHOL, HDL, LDLCALC, TRIG, CHOLHDL, LDLDIRECT in the last 72 hours.  No results found for:  HGBA1C ------------------------------------------------------------------------------------------------------------------ Recent Labs    06/01/17 0337  TSH 1.787   ------------------------------------------------------------------------------------------------------------------ No results for input(s): VITAMINB12, FOLATE, FERRITIN, TIBC, IRON, RETICCTPCT in the last 72 hours.  Coagulation profile No results for input(s): INR, PROTIME in the last 168 hours.  No results for input(s): DDIMER in the last 72 hours.  Cardiac Enzymes Recent Labs  Lab 06/17/2017 2111 06/21/2017 2358 05/31/17 0606  TROPONINI 0.04* 0.03* <0.03   ------------------------------------------------------------------------------------------------------------------ No results found for: BNP   Roxan Hockey M.D on 06/02/2017 at 5:40 PM  Between 7am to 7pm - Pager - [phone]  After 7pm go to [email or web address] - password [email or web address]  [facility] -  Office  [phone]   Voice Recognition Viviann Spare dictation system was used to create this note, attempts have been made to correct errors. Please contact the author with questions and/or clarifications.

## 2017-06-03 ENCOUNTER — Inpatient Hospital Stay (HOSPITAL_COMMUNITY): Payer: Medicare Other

## 2017-06-03 LAB — BLOOD GAS, ARTERIAL
Acid-base deficit: 0.5 mmol/L (ref 0.0–2.0)
Bicarbonate: 22.7 mmol/L (ref 20.0–28.0)
DRAWN BY: 270211
FIO2: 0.4
O2 SAT: 87.5 %
PATIENT TEMPERATURE: 37
PCO2 ART: 33.7 mmHg (ref 32.0–48.0)
PEEP: 5 cmH2O
PH ART: 7.443 (ref 7.350–7.450)
RATE: 14 resp/min
VT: 490 mL
pO2, Arterial: 51.1 mmHg — ABNORMAL LOW (ref 83.0–108.0)

## 2017-06-03 LAB — GLUCOSE, CAPILLARY
GLUCOSE-CAPILLARY: 91 mg/dL (ref 65–99)
GLUCOSE-CAPILLARY: 134 mg/dL — AB (ref 65–99)
GLUCOSE-CAPILLARY: 141 mg/dL — AB (ref 65–99)
Glucose-Capillary: 131 mg/dL — ABNORMAL HIGH (ref 65–99)

## 2017-06-03 LAB — COMPREHENSIVE METABOLIC PANEL
ALBUMIN: 2.5 g/dL — AB (ref 3.5–5.0)
ALT: 23 U/L (ref 17–63)
ANION GAP: 7 (ref 5–15)
AST: 24 U/L (ref 15–41)
Alkaline Phosphatase: 60 U/L (ref 38–126)
BUN: 24 mg/dL — AB (ref 6–20)
CO2: 22 mmol/L (ref 22–32)
Calcium: 8.6 mg/dL — ABNORMAL LOW (ref 8.9–10.3)
Chloride: 116 mmol/L — ABNORMAL HIGH (ref 101–111)
Creatinine, Ser: 0.94 mg/dL (ref 0.61–1.24)
GFR calc Af Amer: >60 mL/min (ref >60)
GFR calc non Af Amer: >60 mL/min (ref >60)
GLUCOSE: 95 mg/dL (ref 65–99)
POTASSIUM: 3 mmol/L — AB (ref 3.5–5.1)
SODIUM: 145 mmol/L (ref 135–145)
TOTAL PROTEIN: 5.6 g/dL — AB (ref 6.5–8.1)
Total Bilirubin: 0.8 mg/dL (ref 0.3–1.2)

## 2017-06-03 LAB — MAGNESIUM
Magnesium: 1.8 mg/dL (ref 1.7–2.4)
Magnesium: 1.9 mg/dL (ref 1.7–2.4)

## 2017-06-03 LAB — CBC
HCT: 33.6 % — ABNORMAL LOW (ref 39.0–52.0)
HEMOGLOBIN: 10.9 g/dL — AB (ref 13.0–17.0)
MCH: 32.2 pg (ref 26.0–34.0)
MCHC: 32.4 g/dL (ref 30.0–36.0)
MCV: 99.1 fL (ref 78.0–100.0)
Platelets: 91 10*3/uL — ABNORMAL LOW (ref 150–400)
RBC: 3.39 MIL/uL — ABNORMAL LOW (ref 4.22–5.81)
RDW: 14.8 % (ref 11.5–15.5)
WBC: 4.3 10*3/uL (ref 4.0–10.5)

## 2017-06-03 LAB — PHOSPHORUS
Phosphorus: 1.3 mg/dL — ABNORMAL LOW (ref 2.5–4.6)
Phosphorus: 1.2 mg/dL — ABNORMAL LOW (ref 2.5–4.6)

## 2017-06-03 MED ORDER — PRO-STAT SUGAR FREE PO LIQD
30.0000 mL | Freq: Three times a day (TID) | ORAL | Status: DC
  Administered 2017-06-03 – 2017-06-08 (×15): 30 mL
  Filled 2017-06-03 (×15): qty 30

## 2017-06-03 MED ORDER — PRO-STAT SUGAR FREE PO LIQD
30.0000 mL | Freq: Two times a day (BID) | ORAL | Status: DC
  Administered 2017-06-03: 30 mL
  Filled 2017-06-03: qty 30

## 2017-06-03 MED ORDER — MIDAZOLAM HCL 2 MG/2ML IJ SOLN
INTRAMUSCULAR | Status: AC
  Administered 2017-06-03: 2 mg
  Filled 2017-06-03: qty 4

## 2017-06-03 MED ORDER — SODIUM CHLORIDE 3 % IN NEBU
4.0000 mL | INHALATION_SOLUTION | Freq: Every day | RESPIRATORY_TRACT | Status: AC
  Administered 2017-06-03 – 2017-06-05 (×3): 4 mL via RESPIRATORY_TRACT
  Filled 2017-06-03 (×3): qty 4

## 2017-06-03 MED ORDER — OSMOLITE 1.2 CAL PO LIQD
1000.0000 mL | ORAL | Status: DC
  Administered 2017-06-03 – 2017-06-07 (×5): 1000 mL
  Filled 2017-06-03: qty 1000

## 2017-06-03 MED ORDER — VITAL HIGH PROTEIN PO LIQD
1000.0000 mL | Freq: Every day | ORAL | Status: DC
  Administered 2017-06-03: 1000 mL
  Filled 2017-06-03: qty 1000

## 2017-06-03 NOTE — Progress Notes (Addendum)
PULMONARY / CRITICAL CARE MEDICINE   Name: Patrick Carr MRN: [id number] DOB: 1933/11/26    ADMISSION DATE:  05/29/2017 CONSULTATION DATE: 1/8  REFERRING MD:  Denton Brick  CHIEF COMPLAINT:  Delirium and hypoxia   HISTORY OF PRESENT ILLNESS:   This is an 84 year old male patient with a history limited stage small cell lung cancer diagnosed in August 2015 status post chemotherapy and radiation and has been on observation since December 2015 with stable imaging.  Admitted 1/5 with several week history of cough, shortness of breath, malaise fatigue and fevers.  Apparently was found altered on the floor by family members which prompted him being brought to the emergency room.  In the emergency room he was found to have what appeared to be right-sided pneumonia, pulse oximetry in the 70s, he was febrile with a white blood cell count of 11,000 he was admitted with a working diagnosis of pneumonia.  Blood cultures were obtained and were positive for pneumococcal pneumonia.  On 1/6 rapid response was called due to patient having new onset atrial fibrillation with rapid ventricular response (heart rate as high as 180s).  She was transferred to the stepdown unit,Therapeutic interventions have included Cardizem infusion, diltiazem drip, and cardiology evaluation.  His atrial fibrillation as of 1/7 is rate since then has been better controlled with cardiology feeling that this is more likely to be multifocal tachycardia.  He had remained encephalopathic and agitated at times requiring intermittent as needed benzodiazepines.  As of 1/8 the patient is minimally verbally responsive, he has been advanced from high flow nasal cannula to nonrebreather and most recent chest x-ray demonstrates marked right greater than left airspace disease with possible right sided effusion superimposed on top of infiltrate/atelectasis.  Because of high oxygen requirements as well as ongoing delirium and concern about lack of clinical  improvement critical care team is been asked to evaluate by the medicine service.   SUBJECTIVE:  Sedated on vent  VITAL SIGNS: BP (!) 156/88   Pulse 88   Temp 100 F (37.8 C)   Resp (!) 24   Ht 5\' 5"  (1.651 m)   Wt 117 lb 15.1 oz (53.5 kg)   SpO2 98%   BMI 19.63 kg/m   HEMODYNAMICS:    VENTILATOR SETTINGS: Vent Mode: PRVC FiO2 (%):  [40 %-100 %] 40 % Set Rate:  [14 bmp] 14 bmp Vt Set:  [460 mL-490 mL] 490 mL PEEP:  [5 cmH20] 5 cmH20 Plateau Pressure:  [15 cmH20-20 cmH20] 17 cmH20  INTAKE / OUTPUT:  Intake/Output Summary (Last 24 hours) at 06/03/2017 0900 Last data filed at 06/03/2017 0600 Gross per 24 hour  Intake 2146.92 ml  Output 500 ml  Net 1646.92 ml     PHYSICAL EXAMINATION: General: 84 year old white male currently sedated on ventilator. HEENT: Orally intubated no jugular venous distention mucous membranes are moist sclera nonicteric Pulmonary: Scattered rhonchi, decreased breath sounds on the right.  Still yellow tinged tracheal secretions from endotracheal tube.  Attempted pressure support ventilation, became tachypneic with increased accessory use this a.m. Cardiac: Regular irregular, no murmur rub or gallop Abdomen: Soft nontender no organomegaly positive bowel sounds Extremities/musculoskeletal: Scattered areas of ecchymosis.  Some increased dependent edema compared to yesterday's exam.  Brisk cap refill strong pulses warm to palpation Neuro: Opens eyes intermittently, does not follow commands but does reach for endotracheal tube gets agitated from time to time  LABS:  BMET Recent Labs  Lab 05/31/17 0606 06/01/17 0947 06/02/17 0827  NA 141 144 145  K 3.7 3.5 3.4*  CL 110 116* 117*  CO2 23 22 23   BUN 44* 34* 28*  CREATININE 1.37* 0.88 0.85  GLUCOSE 101* 92 126*    Electrolytes Recent Labs  Lab 05/31/17 0606 06/01/17 0947 06/02/17 0827  CALCIUM 7.9* 7.9* 8.2*  MG  --  1.9  --     CBC Recent Labs  Lab 05/31/17 0606 06/01/17 0947  06/02/17 0827  WBC 5.9 6.4 6.1  HGB 10.8* 10.6* 10.8*  HCT 32.4* 33.1* 33.7*  PLT 97* 75* 78*    Coag's No results for input(s): APTT, INR in the last 168 hours.  Sepsis Markers Recent Labs  Lab 06/17/2017 2111 06/09/2017 2358 06/02/17 1221  LATICACIDVEN 2.2* 1.7 0.8    ABG Recent Labs  Lab 05/31/17 1438 06/02/17 1443 06/02/17 1714  PHART 7.356 7.248* 7.217*  PCO2ART 42.7 52.1* 57.2*  PO2ART 57.5* 81.8* 107    Liver Enzymes No results for input(s): AST, ALT, ALKPHOS, BILITOT, ALBUMIN in the last 168 hours.  Cardiac Enzymes Recent Labs  Lab 06/16/2017 2111 06/06/2017 2358 05/31/17 0606  TROPONINI 0.04* 0.03* <0.03    Glucose Recent Labs  Lab 06/20/2017 1446  GLUCAP 75    Imaging Ct Chest Wo Contrast  Result Date: 06/02/2017 CLINICAL DATA:  Right lung pneumonia. EXAM: CT CHEST WITHOUT CONTRAST TECHNIQUE: Multidetector CT imaging of the chest was performed following the standard protocol without IV contrast. COMPARISON:  08/01/2016 FINDINGS: Cardiovascular: Heart size upper normal. No substantial pericardial effusion. Atherosclerotic calcification is noted in the wall of the thoracic aorta. Mediastinum/Nodes: 9 mm short axis precarinal lymph node. No bulky left hilar lymphadenopathy. Abnormal soft tissue is noted in the right hilar region. The esophagus has normal imaging features. There is no axillary lymphadenopathy. Lungs/Pleura: 2.4 x 1.6 cm spiculated right apical nodule is similar to prior. Underlying changes of centrilobular emphysema noted. Fine lung detail obscured by breathing motion. There is dense right lower and middle mild dependent collapse/consolidation is seen in the left lower lobe and posterior left upper lobe small left pleural effusion evident. Lobe collapse/consolidation with small right pleural effusion Upper Abdomen: Cysts of varying size and attenuation are seen in the kidneys bilaterally. These appear similar to the prior study. Some of these cysts  measure water attenuation compatible with simple cysts. Some of these lesions have attenuation higher than expected for simple cysts (11 mm left upper pole hyperdense lesion on image 139 series 2). While they cannot be completely characterized, hemorrhagic or proteinaceous cysts would be a consideration. Musculoskeletal: Bone windows reveal no worrisome lytic or sclerotic osseous lesions. IMPRESSION: 1. Interval development of dense airspace consolidation in the right middle and lower lobes compatible with pneumonia. The previously described treated neoplasm in the superior segment of the right lower lobe is been incorporated into the airspace consolidation on the current study. 2. Mild dependent atelectasis/infiltrate in the left upper and lower lobes. 3. Small bilateral pleural effusions, right greater than left. 4.  Emphysema. (ICD10-J43.9) 5.  Aortic Atherosclerois (ICD10-170.0) Electronically Signed   By: Misty Stanley M.D.   On: 06/02/2017 13:00   Dg Chest Port 1 View  Result Date: 06/02/2017 CLINICAL DATA:  Acute respiratory failure EXAM: PORTABLE CHEST 1 VIEW COMPARISON:  06/01/2017 chest x-ray, 06/02/2017 chest CT FINDINGS: Endotracheal tube and nasogastric catheter are noted in satisfactory position new from the prior exam. Right lower lobe consolidation is noted similar to that seen on chest CT. Pleural effusion is noted as well. The overall appearance is relatively stable  from the prior exam. Left lung is clear. Chronic changes are noted in the right apex consistent with known treated neoplasm. IMPRESSION: Persistent right lower lobe infiltrate and effusion. The overall appearance is similar to that seen on recent CT. Endotracheal tube and nasogastric catheter in satisfactory position. Electronically Signed   By: Inez Catalina M.D.   On: 06/02/2017 16:28     STUDIES:  CT head 1/5: Negative  CULTURES: Blood cultures 1/5 positive for Streptococcus pneumoniae  ANTIBIOTICS: 8 azithromycin  1/5>>>1/9 Rocephin 1/5>>>  SIGNIFICANT EVENTS:   LINES/TUBES: OETT 1/8>>>  DISCUSSION: 84 year old male patient with pneumococcal pneumonia and bacteremia course complicated by acute agitated delirium superimposed on underlying dementia. Intubated 1/8 for progressive hypercarbia and hypoxia. Failed SBT this am.  Cont abx.  Cont supportive care Adding tubefeeds and pulm hygiene   ASSESSMENT / PLAN:  Acute hypoxic and hypercarbic respiratory failure in the setting of pneumococcal pneumonia Portable chest x-ray reviewed: Demonstrate right greater than left airspace disease appears to have element of right effusion PCXR; post intubation improved some w/ dense RLL airspace disease.  Required intubation for worsening hypercarbia  Plan Cont full vent support Adding hypertonic neb and chest percussion PAD protocol as below (goal 0 to -1) F/u abg and CXR Given cancer hx may need to consider airway eval to assure no endobronchial issues etc...  Sirs/sepsis in the setting of pneumococcal bacteremia and pneumonia Plan Cont tele Even vol status goal  Acute encephalopathy superimposed on underlying dementia Plan Cont precedex RAS goal 0 to -1  Multifocal atrial tachycardia ->in and out of MAT Plan Cont tele euvolemic volume goal  Fluid and electrolyte balance: Mild hyperchloremia and IPO kalemia Plan Trend and transfuse as indicated   Anemia of chronic disease -no evidence of bleeding Plan Trend cbc [place] heparin  Transfuse per protocol    DVT prophylaxis: [place] heparin  SUP: NA -->PPI Diet: NPO -->tubefeeds Activity: bedrest Disposition : ICU  .[email or web address]  06/03/2017, 8:42 AM  Attending:  I have seen and examined the patient with nurse practitioner/resident and agree with the note above.  We formulated the plan together and I elicited the following history.    Mr. Aird was intubated yesterday, stable overnight. Vitals:   06/03/17 0600 06/03/17 0800 06/03/17 0811  06/03/17 0900  BP: (!) 156/89 (!) 156/88    Pulse: 100 88  94  Resp: (!) 22 (!) 24  (!) 28  Temp: 99.3 F (37.4 C) 100 F (37.8 C)  100 F (37.8 C)  TempSrc:      SpO2: 98% 96% 98% 96%  Weight:      Height:       Vent Mode: PRVC FiO2 (%):  [40 %-100 %] 40 % Set Rate:  [14 bmp] 14 bmp Vt Set:  [460 mL-490 mL] 490 mL PEEP:  [5 cmH20] 5 cmH20 Plateau Pressure:  [15 cmH20-20 cmH20] 17 cmH20  General:  In bed on vent HENT: NCAT ETT in place PULM: CTA B, vent supported breathing CV: RRR, no mgr GI: BS+, soft, nontender MSK: normal bulk and tone Neuro: sedated on vent  CXR reviewed: dense consolidation of RLL, ETT in place  Acute respiratory failure with hypoxemia: continue full vent support today, daily SBT Strep CAP> picture entirely consistent with severe CAP from strep but needs bronch given history of lung cancer to ensure no recurrent endobronchial disease  Daughter updated bedside by me today at length  My cc time 35 minutes  Roselie Awkward, MD [place] [facility] Pager: [phone]  Cell: [phone] After 3pm or if no response, call [phone]

## 2017-06-03 NOTE — Progress Notes (Signed)
Initial Nutrition Assessment  DOCUMENTATION CODES:   Non-severe (moderate) malnutrition in context of chronic illness  INTERVENTION:   Osmolite 1.2 @ 40 ml/hr 30 ml Prostat TID  Provides: 1452 kcals, 83 grams protein, 787 ml free water. This meets 99% of calorie needs and 100% of protein needs.   Pt is at risk for refeeding. Monitor and supplement electrolytes as needed per MD descretion.   NUTRITION DIAGNOSIS:   Moderate Malnutrition related to chronic illness, cancer and cancer related treatments as evidenced by energy intake < or equal to 50% for > or equal to 1 month, mild fat depletion, moderate muscle depletion, severe muscle depletion.  GOAL:   Patient will meet greater than or equal to 90% of their needs  MONITOR:   Diet advancement, Vent status, Labs, Weight trends, TF tolerance, I & O's  REASON FOR ASSESSMENT:   Consult Enteral/tube feeding initiation and management  ASSESSMENT:   Pt with PMH significant for BPH, right small cell lung cancer s/p chemotherapy (04/2014), HLD, HTN. Presents to ED with complaints of 3-4 weeks history of cough, SOB, and malaise. Admitted for sepsis secondary to pulmonary source. Pt transferred to SDU with new onset a-fib requiring Cardizem gtt, intubated due to impending respiratory failure 1/8.    RD consulted for tube feeding recommendations. Pt started on PeP[place] protocol. Spoke with daughter at bedside. Reports she is unsure of appetite recently as she was on vacation. Pt is not a very big eater, typically consumes 2 meals per day. Breakfast is usually larger with eggs and oatmeal. Dinner is smaller with a meat, vegetable, and grain. Pt previously consumed Ensure but stopped over the last couple of months. Daughter reports pt has never weighed over 135 lb his entire life and has remained around 125 lb ever since his cancer diagnosis in 2015. Records indicate pt weighed 124 lb in 07/2016 and 117 lb this admission. This shows a 5.6% weight  loss in 10 months. Insignificant for time frame. A limited Nutrition-Focused physical exam was completed. Pt shows moderate muscle depletions. Unable to assess all regions at this time.   Patient is currently intubated on ventilator support MV: 14.6 L/min Temp (24hrs), Avg:97.6 F (36.4 C), Min:94.8 F (34.9 C), Max:100 F (37.8 C) BP: 160/84 MAP: 106 Propofol: None  Medications reviewed and include: IV abx, precedex Labs reviewed: K 3.0 (L) BUN 24 (H) Phosphorus 1.3 (L)  NUTRITION - FOCUSED PHYSICAL EXAM:    Most Recent Value  Orbital Region  Mild depletion  Upper Arm Region  Unable to assess  Thoracic and Lumbar Region  Unable to assess  Buccal Region  No depletion  Temple Region  Severe depletion  Clavicle Bone Region  Severe depletion  Clavicle and Acromion Bone Region  Severe depletion  Scapular Bone Region  Unable to assess  Dorsal Hand  Unable to assess [mits]  Patellar Region  Moderate depletion  Anterior Thigh Region  Moderate depletion  Posterior Calf Region  Moderate depletion  Edema (RD Assessment)  None  Hair  Reviewed  Eyes  Unable to assess  Mouth  Unable to assess  Skin  Reviewed  Nails  Unable to assess       Diet Order:  Diet NPO time specified  EDUCATION NEEDS:   Not appropriate for education at this time  Skin:  Skin Assessment: Reviewed RN Assessment  Last BM:  06/01/2017  Height:   Ht Readings from Last 1 Encounters:  06/02/17 5\' 5"  (1.651 m)    Weight:  Wt Readings from Last 1 Encounters:  05/31/17 117 lb 15.1 oz (53.5 kg)    Ideal Body Weight:  61.8 kg  BMI:  Body mass index is 19.63 kg/m.  Estimated Nutritional Needs:   Kcal:  1464 kcal (PSU)  Protein:  75-85 grams (1.4-1.6 g/kg)  Fluid:  >1.4 L/day    Mariana Single RD, LDN Clinical Nutrition Pager # - [phone]

## 2017-06-03 NOTE — Procedures (Signed)
PCCM Video Bronchoscopy Procedure Note  The patient was informed of the risks (including but not limited to bleeding, infection, respiratory failure, lung injury, tooth/oral injury) and benefits of the procedure and gave consent, see chart.  Indication: Acute respiratory failure, severe Community Acquired Pneumonia  Post Procedure Diagnosis: Same  Location: [facility] room 1232  Condition pre procedure: Critically ill, on vent  Medications for procedure: versed injection, precedex infusion, fentanyl injection  Procedure description: The bronchoscope was introduced through the endotracheal tube and passed to the bilateral lungs to the level of the subsegmental bronchi throughout the tracheobronchial tree.  Airway exam revealed thick bloody secretions throughout both lungs.  These were suctioned from the airway.  The bilateral lungs were inspected and no airway lesion or mass could be seen.    Procedures performed: None  Specimens sent: culture from right mainstem mucus secretions  Condition post procedure: critically ill, on vent  EBL: none from procedure  Complications: none immediate  Roselie Awkward, MD [place] [facility] Pager: [phone] Cell: [phone] After 3pm or if no response, call [phone]

## 2017-06-04 ENCOUNTER — Inpatient Hospital Stay (HOSPITAL_COMMUNITY): Payer: Medicare Other

## 2017-06-04 DIAGNOSIS — E440 Moderate protein-calorie malnutrition: Secondary | ICD-10-CM

## 2017-06-04 DIAGNOSIS — C3491 Malignant neoplasm of unspecified part of right bronchus or lung: Secondary | ICD-10-CM

## 2017-06-04 LAB — COMPREHENSIVE METABOLIC PANEL
ALT: 20 U/L (ref 17–63)
AST: 26 U/L (ref 15–41)
Albumin: 2.1 g/dL — ABNORMAL LOW (ref 3.5–5.0)
Alkaline Phosphatase: 60 U/L (ref 38–126)
Anion gap: 7 (ref 5–15)
BILIRUBIN TOTAL: 0.8 mg/dL (ref 0.3–1.2)
BUN: 25 mg/dL — AB (ref 6–20)
CHLORIDE: 112 mmol/L — AB (ref 101–111)
CO2: 27 mmol/L (ref 22–32)
CREATININE: 0.84 mg/dL (ref 0.61–1.24)
Calcium: 8.5 mg/dL — ABNORMAL LOW (ref 8.9–10.3)
GFR calc Af Amer: >60 mL/min (ref >60)
GFR calc non Af Amer: >60 mL/min (ref >60)
GLUCOSE: 189 mg/dL — AB (ref 65–99)
Potassium: 2.6 mmol/L — CL (ref 3.5–5.1)
Sodium: 146 mmol/L — ABNORMAL HIGH (ref 135–145)
TOTAL PROTEIN: 5.2 g/dL — AB (ref 6.5–8.1)

## 2017-06-04 LAB — CBC
HCT: 30.6 % — ABNORMAL LOW (ref 39.0–52.0)
Hemoglobin: 10.2 g/dL — ABNORMAL LOW (ref 13.0–17.0)
MCH: 32.3 pg (ref 26.0–34.0)
MCHC: 33.3 g/dL (ref 30.0–36.0)
MCV: 96.8 fL (ref 78.0–100.0)
PLATELETS: 98 10*3/uL — AB (ref 150–400)
RBC: 3.16 MIL/uL — ABNORMAL LOW (ref 4.22–5.81)
RDW: 14.6 % (ref 11.5–15.5)
WBC: 4 10*3/uL (ref 4.0–10.5)

## 2017-06-04 LAB — BASIC METABOLIC PANEL
ANION GAP: 5 (ref 5–15)
BUN: 23 mg/dL — AB (ref 6–20)
CHLORIDE: 111 mmol/L (ref 101–111)
CO2: 27 mmol/L (ref 22–32)
Calcium: 8.4 mg/dL — ABNORMAL LOW (ref 8.9–10.3)
Creatinine, Ser: 0.74 mg/dL (ref 0.61–1.24)
GFR calc Af Amer: >60 mL/min (ref >60)
Glucose, Bld: 141 mg/dL — ABNORMAL HIGH (ref 65–99)
POTASSIUM: 3.9 mmol/L (ref 3.5–5.1)
Sodium: 143 mmol/L (ref 135–145)

## 2017-06-04 LAB — MAGNESIUM
MAGNESIUM: 1.9 mg/dL (ref 1.7–2.4)
Magnesium: 1.7 mg/dL (ref 1.7–2.4)

## 2017-06-04 LAB — GLUCOSE, CAPILLARY
GLUCOSE-CAPILLARY: 214 mg/dL — AB (ref 65–99)
GLUCOSE-CAPILLARY: 126 mg/dL — AB (ref 65–99)
GLUCOSE-CAPILLARY: 123 mg/dL — AB (ref 65–99)
Glucose-Capillary: 191 mg/dL — ABNORMAL HIGH (ref 65–99)
Glucose-Capillary: 166 mg/dL — ABNORMAL HIGH (ref 65–99)
Glucose-Capillary: 131 mg/dL — ABNORMAL HIGH (ref 65–99)

## 2017-06-04 LAB — PHOSPHORUS
PHOSPHORUS: 2.2 mg/dL — AB (ref 2.5–4.6)
Phosphorus: 1.1 mg/dL — ABNORMAL LOW (ref 2.5–4.6)

## 2017-06-04 MED ORDER — POTASSIUM CHLORIDE 10 MEQ/100ML IV SOLN: 10.0000 meq | INTRAVENOUS | Status: AC

## 2017-06-04 MED ORDER — CHLORHEXIDINE GLUCONATE CLOTH 2 % EX PADS: 6.0000 | MEDICATED_PAD | Freq: Every day | CUTANEOUS | Status: DC

## 2017-06-04 MED ORDER — POTASSIUM CHLORIDE 20 MEQ/15ML (10%) PO SOLN
40.0000 meq | ORAL | Status: AC
  Administered 2017-06-04 (×2): 40 meq
  Filled 2017-06-04 (×2): qty 30

## 2017-06-04 MED ORDER — SODIUM CHLORIDE 0.9% FLUSH: 10.0000 mL | INTRAVENOUS | Status: DC | PRN

## 2017-06-04 MED ORDER — SODIUM CHLORIDE 0.9% FLUSH
10.0000 mL | Freq: Two times a day (BID) | INTRAVENOUS | Status: DC
  Administered 2017-06-04 – 2017-06-08 (×5): 10 mL
  Administered 2017-06-09: 20 mL
  Administered 2017-06-13 – 2017-06-14 (×2): 10 mL

## 2017-06-04 MED ORDER — MIDAZOLAM HCL 2 MG/2ML IJ SOLN
1.0000 mg | INTRAMUSCULAR | Status: AC | PRN
  Administered 2017-06-04 – 2017-06-06 (×3): 1 mg via INTRAVENOUS
  Filled 2017-06-04 (×3): qty 2

## 2017-06-04 MED ORDER — FENTANYL BOLUS VIA INFUSION
25.0000 ug | INTRAVENOUS | Status: DC | PRN
  Administered 2017-06-05 – 2017-06-10 (×9): 25 ug via INTRAVENOUS
  Filled 2017-06-04: qty 25

## 2017-06-04 MED ORDER — POTASSIUM PHOSPHATES 15 MMOLE/5ML IV SOLN
30.0000 mmol | Freq: Once | INTRAVENOUS | Status: AC
  Administered 2017-06-04: 30 mmol via INTRAVENOUS
  Filled 2017-06-04: qty 10

## 2017-06-04 MED ORDER — FREE WATER
200.0000 mL | Status: DC
  Administered 2017-06-04 – 2017-06-14 (×60): 200 mL

## 2017-06-04 MED ORDER — MAGNESIUM SULFATE 2 GM/50ML IV SOLN
2.0000 g | Freq: Once | INTRAVENOUS | Status: AC
  Administered 2017-06-04: 2 g via INTRAVENOUS
  Filled 2017-06-04: qty 50

## 2017-06-04 MED ORDER — MIDAZOLAM HCL 2 MG/2ML IJ SOLN
1.0000 mg | INTRAMUSCULAR | Status: DC | PRN
  Administered 2017-06-05 – 2017-06-09 (×16): 1 mg via INTRAVENOUS
  Administered 2017-06-09: 0.5 mg via INTRAVENOUS
  Administered 2017-06-12: 1 mg via INTRAVENOUS
  Filled 2017-06-04 (×17): qty 2

## 2017-06-04 MED ORDER — INSULIN ASPART 100 UNIT/ML ~~LOC~~ SOLN
0.0000 [IU] | SUBCUTANEOUS | Status: DC
  Administered 2017-06-04 – 2017-06-12 (×23): 2 [IU] via SUBCUTANEOUS
  Administered 2017-06-13: 3 [IU] via SUBCUTANEOUS
  Administered 2017-06-13 – 2017-06-14 (×3): 2 [IU] via SUBCUTANEOUS

## 2017-06-04 MED ORDER — SODIUM CHLORIDE 0.9 % IV SOLN
25.0000 ug/h | INTRAVENOUS | Status: DC
  Administered 2017-06-04: 50 ug/h via INTRAVENOUS
  Administered 2017-06-06 – 2017-06-07 (×2): 100 ug/h via INTRAVENOUS
  Administered 2017-06-08: 175 ug/h via INTRAVENOUS
  Administered 2017-06-08: 200 ug/h via INTRAVENOUS
  Administered 2017-06-08: 50 ug/h via INTRAVENOUS
  Administered 2017-06-09: 250 ug/h via INTRAVENOUS
  Administered 2017-06-10: 150 ug/h via INTRAVENOUS
  Administered 2017-06-11 (×2): 175 ug/h via INTRAVENOUS
  Administered 2017-06-12: 150 ug/h via INTRAVENOUS
  Administered 2017-06-12 (×2): 175 ug/h via INTRAVENOUS
  Administered 2017-06-13: 150 ug/h via INTRAVENOUS
  Filled 2017-06-04 (×14): qty 50

## 2017-06-04 MED ORDER — POTASSIUM CHLORIDE 10 MEQ/100ML IV SOLN
10.0000 meq | INTRAVENOUS | Status: DC
  Administered 2017-06-04 (×2): 10 meq via INTRAVENOUS
  Filled 2017-06-04 (×2): qty 100

## 2017-06-04 NOTE — Progress Notes (Signed)
Date:  June 04, 2017 Chart reviewed for concurrent status and case management needs.  Will continue to follow patient progress. Bronchoscopy on [id number]/bloody secretions from both main stems removed and sent for cultures. Discharge Planning: following for needs.  None present at this time of review. Expected discharge date: June 07 2017 Velva Harman, BSN, [place], [place]

## 2017-06-04 NOTE — Progress Notes (Signed)
[facility] Progress Note Patient Name: Patrick Carr DOB: 02-Aug-1933 MRN: [id number]   Date of Service  06/04/2017  HPI/Events of Note  ?K 2.6  Phos 1.1 Mg 1.7   eICU Interventions  All replaced by IV     Intervention Category Evaluation Type: Other  Jonavon Trieu 06/04/2017, 6:25 AM

## 2017-06-04 NOTE — Progress Notes (Signed)
PULMONARY / CRITICAL CARE MEDICINE   Name: Patrick Carr MRN: [id number] DOB: Oct 10, 1933    ADMISSION DATE:  06/25/2017 CONSULTATION DATE: 1/8  REFERRING MD:  Denton Brick  CHIEF COMPLAINT:  Delirium and hypoxia   HISTORY OF PRESENT ILLNESS:   This is an 84 year old male patient with a history limited stage small cell lung cancer diagnosed in August 2015 status post chemotherapy and radiation and has been on observation since December 2015 with stable imaging.  Admitted 1/5 with several week history of cough, shortness of breath, malaise fatigue and fevers.  Apparently was found altered on the floor by family members which prompted him being brought to the emergency room.  In the emergency room he was found to have what appeared to be right-sided pneumonia, pulse oximetry in the 70s, he was febrile with a white blood cell count of 11,000 he was admitted with a working diagnosis of pneumonia.  Blood cultures were obtained and were positive for pneumococcal pneumonia.  On 1/6 rapid response was called due to patient having new onset atrial fibrillation with rapid ventricular response (heart rate as high as 180s).  She was transferred to the stepdown unit,Therapeutic interventions have included Cardizem infusion, diltiazem drip, and cardiology evaluation.  His atrial fibrillation as of 1/7 is rate since then has been better controlled with cardiology feeling that this is more likely to be multifocal tachycardia.  He had remained encephalopathic and agitated at times requiring intermittent as needed benzodiazepines.  As of 1/8 the patient is minimally verbally responsive, he has been advanced from high flow nasal cannula to nonrebreather and most recent chest x-ray demonstrates marked right greater than left airspace disease with possible right sided effusion superimposed on top of infiltrate/atelectasis.  Because of high oxygen requirements as well as ongoing delirium and concern about lack of clinical  improvement critical care team is been asked to evaluate by the medicine service.   SUBJECTIVE:  Sedated on ventilator  VITAL SIGNS: BP (!) 116/49 (BP Location: Left Arm)   Pulse 87   Temp 99.9 F (37.7 C)   Resp (!) 30   Ht 5\' 5"  (1.651 m)   Wt 134 lb 14.7 oz (61.2 kg)   SpO2 97%   BMI 22.45 kg/m   HEMODYNAMICS:    VENTILATOR SETTINGS: Vent Mode: PRVC FiO2 (%):  [50 %] 50 % Set Rate:  [14 bmp] 14 bmp Vt Set:  [490 mL] 490 mL PEEP:  [5 cmH20] 5 cmH20 Plateau Pressure:  [20 cmH20-25 cmH20] 22 cmH20  INTAKE / OUTPUT:  Intake/Output Summary (Last 24 hours) at 06/04/2017 1245 Last data filed at 06/04/2017 0800 Gross per 24 hour  Intake 2792.36 ml  Output 1825 ml  Net 967.36 ml     PHYSICAL EXAMINATION: General: 84 year old male currently sedated on later HEENT: Normocephalic atraumatic no JVD orally intubated Pulmonary: Diffuse scattered rhonchi, decreased on right  Cardiac: Regular rate and rhythm without murmur rub gallop Abdomen: Soft nontender Extremities: Worsening anasarca, predominantly upper extremities.  Scattered areas of ecchymosis.  Warm, brisk cap refill. Abdomen: Soft nontender no organomegaly.  Next line neuro: Sedated on ventilator GU: Clear yellow urine  LABS:  BMET Recent Labs  Lab 06/02/17 0827 06/03/17 0832 06/04/17 0508  NA 145 145 146*  K 3.4* 3.0* 2.6*  CL 117* 116* 112*  CO2 23 22 27   BUN 28* 24* 25*  CREATININE 0.85 0.94 0.84  GLUCOSE 126* 95 189*    Electrolytes Recent Labs  Lab 06/02/17 0827 06/03/17 0832 06/03/17  1632 06/04/17 0508  CALCIUM 8.2* 8.6*  --  8.5*  MG  --  1.8 1.9 1.7  PHOS  --  1.3* 1.2* 1.1*    CBC Recent Labs  Lab 06/02/17 0827 06/03/17 0832 06/04/17 0508  WBC 6.1 4.3 4.0  HGB 10.8* 10.9* 10.2*  HCT 33.7* 33.6* 30.6*  PLT 78* 91* 98*    Coag's No results for input(s): APTT, INR in the last 168 hours.  Sepsis Markers Recent Labs  Lab 05/29/2017 2111 05/28/2017 2358 06/02/17 1221   LATICACIDVEN 2.2* 1.7 0.8    ABG Recent Labs  Lab 06/02/17 1443 06/02/17 1714 06/03/17 0852  PHART 7.248* 7.217* 7.443  PCO2ART 52.1* 57.2* 33.7  PO2ART 81.8* 107 51.1*    Liver Enzymes Recent Labs  Lab 06/03/17 0832 06/04/17 0508  AST 24 26  ALT 23 20  ALKPHOS 60 60  BILITOT 0.8 0.8  ALBUMIN 2.5* 2.1*    Cardiac Enzymes Recent Labs  Lab 06/01/2017 2111 06/21/2017 2358 05/31/17 0606  TROPONINI 0.04* 0.03* <0.03    Glucose Recent Labs  Lab 06/03/17 1334 06/03/17 1647 06/03/17 1936 06/03/17 2307 06/04/17 0311 06/04/17 0716  GLUCAP 91 131* 134* 141* 214* 191*    Imaging No results found.   STUDIES:  CT head 1/5: Negative  CULTURES: Blood cultures 1/5 positive for Streptococcus pneumoniae  ANTIBIOTICS: 8 azithromycin 1/5>>>1/9 Rocephin 1/5>>>  SIGNIFICANT EVENTS:   LINES/TUBES: OETT 1/8>>>  DISCUSSION: 84 year old male patient with pneumococcal pneumonia and bacteremia course complicated by acute agitated delirium superimposed on underlying dementia. Intubated 1/8 for progressive hypercarbia and hypoxia. Failed SBT this am.  Cont abx.  Continue supportive care Begin weaning on pressure support decrease sedation  ASSESSMENT / PLAN:  Acute hypoxic and hypercarbic respiratory failure in the setting of pneumococcal pneumonia He appears comfortable on a pressure support of 12, still too sedated to allow for extubation  portable chest x-ray reviewed: ET tube in good position, predominantly right-sided airspace disease without significant change probably small right effusion persists Plan Continue full ventilator support with pressure support as tolerated Continuing hypertonic saline for another 24 hours RASS goal 0 Daily assessment for extubation  Acute encephalopathy superimposed on underlying dementia Oversedated on 1/10 Plan Change RASS goal 0 Precedex infusion  Multifocal atrial tachycardia ->in and out of MAT Plan Continue  telemetry monitoring KVO IV fluids  Fluid and electrolyte balance: Hypernatremia, hypokalemia and hypophosphatemia Evolving anasarca Plan Add free water Replace electrolytes Repeat chemistry in a.m. KVO IVF's  Anemia of chronic disease -no evidence of bleeding Plan Trend CBC Subcu heparin Transfuse per protocol  Mild hyperglycemia Plan Add sliding scale insulin   DVT prophylaxis: [place] heparin  SUP: NA -->PPI Diet: NPO -->tubefeeds Activity: bedrest Disposition : ICU   My critical care time 32 minutes Erick Colace ACNP-BC [place] Pager # [phone] OR # [phone] if no answer

## 2017-06-04 NOTE — Progress Notes (Signed)
CRITICAL VALUE ALERT  Critical Value:  K+ 2.6 Date & Time Notied:  06/04/2014 @ 6:15am  Provider Notified: Dr. Corbin Ade  Orders Received/Actions taken: 6 runs of KCL as well as potassium phosphate

## 2017-06-05 ENCOUNTER — Inpatient Hospital Stay (HOSPITAL_COMMUNITY): Payer: Medicare Other

## 2017-06-05 LAB — CBC
HCT: 34.5 % — ABNORMAL LOW (ref 39.0–52.0)
Hemoglobin: 11.4 g/dL — ABNORMAL LOW (ref 13.0–17.0)
MCH: 32.3 pg (ref 26.0–34.0)
MCHC: 33 g/dL (ref 30.0–36.0)
MCV: 97.7 fL (ref 78.0–100.0)
Platelets: 122 10*3/uL — ABNORMAL LOW (ref 150–400)
RBC: 3.53 MIL/uL — ABNORMAL LOW (ref 4.22–5.81)
RDW: 14.8 % (ref 11.5–15.5)
WBC: 6.4 10*3/uL (ref 4.0–10.5)

## 2017-06-05 LAB — BLOOD GAS, ARTERIAL
Acid-Base Excess: 4.3 mmol/L — ABNORMAL HIGH (ref 0.0–2.0)
Acid-Base Excess: 6.3 mmol/L — ABNORMAL HIGH (ref 0.0–2.0)
BICARBONATE: 28.9 mmol/L — AB (ref 20.0–28.0)
BICARBONATE: 30.3 mmol/L — AB (ref 20.0–28.0)
Drawn by: 422461
Drawn by: 295031
FIO2: 80
FIO2: 70
LHR: 14 {breaths}/min
LHR: 14 {breaths}/min
MECHVT: 490 mL
O2 Saturation: 88.5 %
O2 Saturation: 93.1 %
PATIENT TEMPERATURE: 98.6
PCO2 ART: 46.4 mmHg (ref 32.0–48.0)
PCO2 ART: 42.8 mmHg (ref 32.0–48.0)
PEEP: 5 cmH2O
PEEP: 5 cmH2O
Patient temperature: 99.3
VT: 480 mL
pH, Arterial: 7.413 (ref 7.350–7.450)
pH, Arterial: 7.465 — ABNORMAL HIGH (ref 7.350–7.450)
pO2, Arterial: 59.9 mmHg — ABNORMAL LOW (ref 83.0–108.0)
pO2, Arterial: 66.7 mmHg — ABNORMAL LOW (ref 83.0–108.0)

## 2017-06-05 LAB — COMPREHENSIVE METABOLIC PANEL
ALBUMIN: 2.3 g/dL — AB (ref 3.5–5.0)
ALK PHOS: 71 U/L (ref 38–126)
ALT: 22 U/L (ref 17–63)
AST: 26 U/L (ref 15–41)
Anion gap: 4 — ABNORMAL LOW (ref 5–15)
BUN: 22 mg/dL — AB (ref 6–20)
CALCIUM: 8.1 mg/dL — AB (ref 8.9–10.3)
CO2: 31 mmol/L (ref 22–32)
CREATININE: 0.64 mg/dL (ref 0.61–1.24)
Chloride: 107 mmol/L (ref 101–111)
GFR calc Af Amer: >60 mL/min (ref >60)
GFR calc non Af Amer: >60 mL/min (ref >60)
GLUCOSE: 151 mg/dL — AB (ref 65–99)
Potassium: 3.6 mmol/L (ref 3.5–5.1)
Sodium: 142 mmol/L (ref 135–145)
Total Bilirubin: 0.7 mg/dL (ref 0.3–1.2)
Total Protein: 5.5 g/dL — ABNORMAL LOW (ref 6.5–8.1)

## 2017-06-05 LAB — GLUCOSE, CAPILLARY
GLUCOSE-CAPILLARY: 94 mg/dL (ref 65–99)
GLUCOSE-CAPILLARY: 140 mg/dL — AB (ref 65–99)
GLUCOSE-CAPILLARY: 127 mg/dL — AB (ref 65–99)
Glucose-Capillary: 125 mg/dL — ABNORMAL HIGH (ref 65–99)
Glucose-Capillary: 105 mg/dL — ABNORMAL HIGH (ref 65–99)
Glucose-Capillary: 106 mg/dL — ABNORMAL HIGH (ref 65–99)

## 2017-06-05 MED ORDER — FUROSEMIDE 10 MG/ML IJ SOLN
40.0000 mg | Freq: Once | INTRAMUSCULAR | Status: AC
  Administered 2017-06-05: 40 mg via INTRAVENOUS
  Filled 2017-06-05: qty 4

## 2017-06-05 MED ORDER — DILTIAZEM 12 MG/ML ORAL SUSPENSION
30.0000 mg | Freq: Four times a day (QID) | ORAL | Status: DC
  Administered 2017-06-05: 30 mg
  Filled 2017-06-05 (×2): qty 3

## 2017-06-05 MED ORDER — DILTIAZEM 12 MG/ML ORAL SUSPENSION
60.0000 mg | Freq: Four times a day (QID) | ORAL | Status: DC
  Administered 2017-06-05 – 2017-06-07 (×7): 60 mg
  Filled 2017-06-05 (×14): qty 6

## 2017-06-05 MED ORDER — CHLORHEXIDINE GLUCONATE CLOTH 2 % EX PADS
6.0000 | MEDICATED_PAD | Freq: Every day | CUTANEOUS | Status: DC
  Administered 2017-06-05 – 2017-06-14 (×9): 6 via TOPICAL

## 2017-06-05 MED ORDER — DILTIAZEM 12 MG/ML ORAL SUSPENSION
30.0000 mg | Freq: Four times a day (QID) | ORAL | Status: DC
  Filled 2017-06-05: qty 3

## 2017-06-05 MED ORDER — POTASSIUM CHLORIDE 20 MEQ/15ML (10%) PO SOLN
40.0000 meq | Freq: Once | ORAL | Status: AC
  Administered 2017-06-05: 40 meq
  Filled 2017-06-05: qty 30

## 2017-06-05 NOTE — Progress Notes (Signed)
Patient's HR has been anywhere between low 100's to 160's.  Critical Care NP made aware.  Dosage for Cardizem increased.   Will continue to monitor.

## 2017-06-05 NOTE — Progress Notes (Signed)
PULMONARY / CRITICAL CARE MEDICINE   Name: Patrick Carr MRN: [id number] DOB: 26-May-1934    ADMISSION DATE:  06/23/2017 CONSULTATION DATE: 1/8  REFERRING MD:  Denton Brick  CHIEF COMPLAINT:  Delirium and hypoxia   HISTORY OF PRESENT ILLNESS:   This is an 84 year old male patient with a history limited stage small cell lung cancer diagnosed in August 2015 status post chemotherapy and radiation and has been on observation since December 2015 with stable imaging.  Admitted 1/5 with several week history of cough, shortness of breath, malaise fatigue and fevers.  Apparently was found altered on the floor by family members which prompted him being brought to the emergency room.  In the emergency room he was found to have what appeared to be right-sided pneumonia, pulse oximetry in the 70s, he was febrile with a white blood cell count of 11,000 he was admitted with a working diagnosis of pneumonia.  Blood cultures were obtained and were positive for pneumococcal pneumonia.  On 1/6 rapid response was called due to patient having new onset atrial fibrillation with rapid ventricular response (heart rate as high as 180s).  She was transferred to the stepdown unit,Therapeutic interventions have included Cardizem infusion, diltiazem drip, and cardiology evaluation.  His atrial fibrillation as of 1/7 is rate since then has been better controlled with cardiology feeling that this is more likely to be multifocal tachycardia.  He had remained encephalopathic and agitated at times requiring intermittent as needed benzodiazepines.  As of 1/8 the patient is minimally verbally responsive, he has been advanced from high flow nasal cannula to nonrebreather and most recent chest x-ray demonstrates marked right greater than left airspace disease with possible right sided effusion superimposed on top of infiltrate/atelectasis.  Because of high oxygen requirements as well as ongoing delirium and concern about lack of clinical  improvement critical care team is been asked to evaluate by the medicine service.   SUBJECTIVE:  More awake   VITAL SIGNS: BP (!) 161/70 (BP Location: Left Arm)   Pulse 92   Temp 99.7 F (37.6 C) (Bladder)   Resp (!) 28   Ht 5\' 5"  (1.651 m)   Wt 136 lb 7.4 oz (61.9 kg)   SpO2 (!) 85% Comment: increased to 100%  BMI 22.71 kg/m   HEMODYNAMICS:    VENTILATOR SETTINGS: Vent Mode: PRVC FiO2 (%):  [40 %-100 %] 100 % Set Rate:  [14 bmp] 14 bmp Vt Set:  [480 mL-4990 mL] 480 mL PEEP:  [5 cmH20] 5 cmH20 Pressure Support:  [12 cmH20] 12 cmH20 Plateau Pressure:  [16 cmH20-27 cmH20] 18 cmH20  INTAKE / OUTPUT:  Intake/Output Summary (Last 24 hours) at 06/05/2017 0953 Last data filed at 06/05/2017 0800 Gross per 24 hour  Intake 2235.2 ml  Output 2100 ml  Net 135.2 ml     PHYSICAL EXAMINATION: General: 84 year old male more awake today still on full vent support HEENT: Normocephalic atraumatic orally intubated some jugular venous distention Pulmonary: Scattered rhonchi, decreased on right Cardiac: Irregularly irregular atrial tachycardia on monitor Extremities: Somewhat worsening anasarca, scattered areas of ecchymosis brisk cap refill Abdomen: Soft nontender no organomegaly Neuro: Awake extremities nodding appropriately at times GU: Clear yellow  LABS:  BMET Recent Labs  Lab 06/04/17 0508 06/04/17 1658 06/05/17 0358  NA 146* 143 142  K 2.6* 3.9 3.6  CL 112* 111 107  CO2 27 27 31   BUN 25* 23* 22*  CREATININE 0.84 0.74 0.64  GLUCOSE 189* 141* 151*    Electrolytes Recent Labs  Lab 06/03/17 1632 06/04/17 0508 06/04/17 1658 06/05/17 0358  CALCIUM  --  8.5* 8.4* 8.1*  MG 1.9 1.7 1.9  --   PHOS 1.2* 1.1* 2.2*  --     CBC Recent Labs  Lab 06/03/17 0832 06/04/17 0508 06/05/17 0358  WBC 4.3 4.0 6.4  HGB 10.9* 10.2* 11.4*  HCT 33.6* 30.6* 34.5*  PLT 91* 98* 122*    Coag's No results for input(s): APTT, INR in the last 168 hours.  Sepsis  Markers Recent Labs  Lab 06/21/2017 2111 05/29/2017 2358 06/02/17 1221  LATICACIDVEN 2.2* 1.7 0.8    ABG Recent Labs  Lab 06/02/17 1714 06/03/17 0852 06/05/17 0435  PHART 7.217* 7.443 7.413  PCO2ART 57.2* 33.7 46.4  PO2ART 107 51.1* 59.9*    Liver Enzymes Recent Labs  Lab 06/03/17 0832 06/04/17 0508 06/05/17 0358  AST 24 26 26   ALT 23 20 22   ALKPHOS 60 60 71  BILITOT 0.8 0.8 0.7  ALBUMIN 2.5* 2.1* 2.3*    Cardiac Enzymes Recent Labs  Lab 06/07/2017 2111 06/24/2017 2358 05/31/17 0606  TROPONINI 0.04* 0.03* <0.03    Glucose Recent Labs  Lab 06/04/17 1156 06/04/17 1606 06/04/17 1919 06/04/17 2308 06/05/17 0306 06/05/17 0725  GLUCAP 166* 126* 123* 131* 125* 94    Imaging Dg Chest Port 1 View  Result Date: 06/05/2017 CLINICAL DATA:  History of small cell lung cancer with recent cough and shortness of Breath EXAM: PORTABLE CHEST 1 VIEW COMPARISON:  06/04/2017 FINDINGS: Persistent right-sided pleural effusion is noted. Infiltrative changes in the right lung are again seen similar to that on the prior examination. The left lung is well aerated. Mild persistent left basilar changes are noted. Nasogastric catheter and endotracheal tube are seen. Right-sided PICC line is again noted and stable. IMPRESSION: No significant interval change from the previous day. Electronically Signed   By: Inez Catalina M.D.   On: 06/05/2017 09:43     STUDIES:  CT head 1/5: Negative  CULTURES: Blood cultures 1/5 positive for Streptococcus pneumoniae  ANTIBIOTICS: 8 azithromycin 1/5>>>1/9 Rocephin 1/5>>>  SIGNIFICANT EVENTS:   LINES/TUBES: OETT 1/8>>> picc RUE 1/10>>>  DISCUSSION: 84 year old male patient with pneumococcal pneumonia and bacteremia course complicated by acute agitated delirium superimposed on underlying dementia. Intubated 1/8 for progressive hypercarbia and hypoxia.  Slow improvement Think getting somewhat erroneous data from pulse ox Cont abx Lasix today   Cont supportive care   ASSESSMENT / PLAN:  Acute hypoxic and hypercarbic respiratory failure in the setting of pneumococcal pneumonia MS improved PCXR: persistent right sided airspace disease w/ element of effusion  Plan Cont full vent support Add lasix  Day 7/10 rocephin  Daily assessment for weaning   Acute encephalopathy superimposed on underlying dementia Oversedated on 1/10 Plan rass goal 0 Supportive care precedex gtt  Multifocal atrial tachycardia ->in and out of MAT Plan Cont tele  Add scheduled CCB  Fluid and electrolyte balance: Hypernatremia & hypokalemia Evolving anasarca Plan Cont free water Lasix x 1 Replace lytes as indicated.  Repeat am chem   Anemia of chronic disease -no evidence of bleeding Plan Trend cbc [place] heparin  Transfuse per protocol   Mild hyperglycemia Plan ssi    DVT prophylaxis: [place] heparin  SUP: NA -->PPI Diet: NPO -->tubefeeds Activity: bedrest Disposition : ICU   My critical care time 31 minutes Erick Colace ACNP-BC [place] Pager # [phone] OR # [phone] if no answer

## 2017-06-06 ENCOUNTER — Inpatient Hospital Stay (HOSPITAL_COMMUNITY): Payer: Medicare Other

## 2017-06-06 LAB — GLUCOSE, CAPILLARY
GLUCOSE-CAPILLARY: 123 mg/dL — AB (ref 65–99)
GLUCOSE-CAPILLARY: 126 mg/dL — AB (ref 65–99)
Glucose-Capillary: 131 mg/dL — ABNORMAL HIGH (ref 65–99)
Glucose-Capillary: 142 mg/dL — ABNORMAL HIGH (ref 65–99)
Glucose-Capillary: 148 mg/dL — ABNORMAL HIGH (ref 65–99)
Glucose-Capillary: 136 mg/dL — ABNORMAL HIGH (ref 65–99)

## 2017-06-06 LAB — MAGNESIUM: Magnesium: 1.5 mg/dL — ABNORMAL LOW (ref 1.7–2.4)

## 2017-06-06 LAB — COMPREHENSIVE METABOLIC PANEL
ALBUMIN: 1.9 g/dL — AB (ref 3.5–5.0)
ALK PHOS: 58 U/L (ref 38–126)
ALT: 24 U/L (ref 17–63)
ANION GAP: 7 (ref 5–15)
AST: 27 U/L (ref 15–41)
BILIRUBIN TOTAL: 0.5 mg/dL (ref 0.3–1.2)
BUN: 26 mg/dL — AB (ref 6–20)
CALCIUM: 7.3 mg/dL — AB (ref 8.9–10.3)
CO2: 34 mmol/L — ABNORMAL HIGH (ref 22–32)
CREATININE: 0.59 mg/dL — AB (ref 0.61–1.24)
Chloride: 98 mmol/L — ABNORMAL LOW (ref 101–111)
GFR calc Af Amer: >60 mL/min (ref >60)
GFR calc non Af Amer: >60 mL/min (ref >60)
Glucose, Bld: 138 mg/dL — ABNORMAL HIGH (ref 65–99)
Potassium: 3.1 mmol/L — ABNORMAL LOW (ref 3.5–5.1)
Sodium: 139 mmol/L (ref 135–145)
TOTAL PROTEIN: 4.7 g/dL — AB (ref 6.5–8.1)

## 2017-06-06 LAB — CBC
HEMATOCRIT: 28.7 % — AB (ref 39.0–52.0)
HEMATOCRIT: 30.2 % — AB (ref 39.0–52.0)
HEMOGLOBIN: 9.4 g/dL — AB (ref 13.0–17.0)
HEMOGLOBIN: 9.8 g/dL — AB (ref 13.0–17.0)
MCH: 32.2 pg (ref 26.0–34.0)
MCH: 31.8 pg (ref 26.0–34.0)
MCHC: 32.8 g/dL (ref 30.0–36.0)
MCHC: 32.5 g/dL (ref 30.0–36.0)
MCV: 98.3 fL (ref 78.0–100.0)
MCV: 98.1 fL (ref 78.0–100.0)
Platelets: 122 10*3/uL — ABNORMAL LOW (ref 150–400)
Platelets: 147 10*3/uL — ABNORMAL LOW (ref 150–400)
RBC: 2.92 MIL/uL — AB (ref 4.22–5.81)
RBC: 3.08 MIL/uL — AB (ref 4.22–5.81)
RDW: 14.9 % (ref 11.5–15.5)
RDW: 14.6 % (ref 11.5–15.5)
WBC: 7.4 10*3/uL (ref 4.0–10.5)
WBC: 8.7 10*3/uL (ref 4.0–10.5)

## 2017-06-06 LAB — CULTURE, RESPIRATORY

## 2017-06-06 LAB — CULTURE, RESPIRATORY W GRAM STAIN

## 2017-06-06 MED ORDER — FUROSEMIDE 10 MG/ML IJ SOLN
40.0000 mg | Freq: Once | INTRAMUSCULAR | Status: AC
  Administered 2017-06-06: 40 mg via INTRAVENOUS
  Filled 2017-06-06: qty 4

## 2017-06-06 MED ORDER — PHENYLEPHRINE HCL-NACL 10-0.9 MG/250ML-% IV SOLN
0.0000 ug/min | INTRAVENOUS | Status: DC
  Administered 2017-06-06: 20 ug/min via INTRAVENOUS
  Administered 2017-06-07: 90 ug/min via INTRAVENOUS
  Administered 2017-06-07: 110 ug/min via INTRAVENOUS
  Administered 2017-06-07: 90 ug/min via INTRAVENOUS
  Filled 2017-06-06 (×2): qty 250
  Filled 2017-06-06: qty 500

## 2017-06-06 MED ORDER — POTASSIUM CHLORIDE 20 MEQ/15ML (10%) PO SOLN
30.0000 meq | ORAL | Status: AC
  Administered 2017-06-06 (×2): 30 meq
  Filled 2017-06-06 (×2): qty 30

## 2017-06-06 MED ORDER — SODIUM CHLORIDE 0.9 % IV SOLN
6.0000 g | Freq: Once | INTRAVENOUS | Status: AC
  Administered 2017-06-06: 6 g via INTRAVENOUS
  Filled 2017-06-06: qty 10

## 2017-06-06 NOTE — Progress Notes (Signed)
PULMONARY / CRITICAL CARE MEDICINE   Name: Patrick Carr MRN: [id number] DOB: 1933/12/10    ADMISSION DATE:  05/26/2017 CONSULTATION DATE: 1/8  REFERRING MD:  Denton Brick  CHIEF COMPLAINT:  Delirium and hypoxia   BRIEF: 84 y/o male with a history of limited stage small cell lung cancer treated in 2015 is here now with acute respiratory failure with hypoxemia due to severe CAP. At baseline he remains active and lives independently.     SUBJECTIVE:  Hgb drop overnight but no clear sign of bleeding   VITAL SIGNS: BP (!) 116/57   Pulse 92   Temp 99.3 F (37.4 C)   Resp (!) 22   Ht 5\' 5"  (1.651 m)   Wt 60.2 kg (132 lb 11.5 oz)   SpO2 91%   BMI 22.09 kg/m   HEMODYNAMICS:    VENTILATOR SETTINGS: Vent Mode: PRVC FiO2 (%):  [60 %-100 %] 60 % Set Rate:  [14 bmp] 14 bmp Vt Set:  [480 mL-490 mL] 490 mL PEEP:  [5 cmH20-8 cmH20] 8 cmH20 Plateau Pressure:  [18 cmH20-27 cmH20] 20 cmH20  INTAKE / OUTPUT:  Intake/Output Summary (Last 24 hours) at 06/06/2017 0743 Last data filed at 06/05/2017 2200 Gross per 24 hour  Intake 1296.35 ml  Output 4150 ml  Net -2853.65 ml     PHYSICAL EXAMINATION:  General:  In bed on vent HENT: NCAT ETT in place PULM: Crackles R base, vent supported breathing CV: RRR, no mgr GI: BS+, soft, nontender Derm: swelling around elbows noted MSK: normal bulk and tone Neuro: awake, nods head appropriately, follows commands    LABS:  BMET Recent Labs  Lab 06/04/17 1658 06/05/17 0358 06/06/17 0500  NA 143 142 139  K 3.9 3.6 3.1*  CL 111 107 98*  CO2 27 31 34*  BUN 23* 22* 26*  CREATININE 0.74 0.64 0.59*  GLUCOSE 141* 151* 138*    Electrolytes Recent Labs  Lab 06/03/17 1632 06/04/17 0508 06/04/17 1658 06/05/17 0358 06/06/17 0500  CALCIUM  --  8.5* 8.4* 8.1* 7.3*  MG 1.9 1.7 1.9  --  1.5*  PHOS 1.2* 1.1* 2.2*  --   --     CBC Recent Labs  Lab 06/04/17 0508 06/05/17 0358 06/06/17 0500  WBC 4.0 6.4 7.4  HGB 10.2* 11.4*  9.4*  HCT 30.6* 34.5* 28.7*  PLT 98* 122* 122*    Coag's No results for input(s): APTT, INR in the last 168 hours.  Sepsis Markers Recent Labs  Lab 05/29/2017 2111 06/21/2017 2358 06/02/17 1221  LATICACIDVEN 2.2* 1.7 0.8    ABG Recent Labs  Lab 06/03/17 0852 06/05/17 0435 06/05/17 1100  PHART 7.443 7.413 7.465*  PCO2ART 33.7 46.4 42.8  PO2ART 51.1* 59.9* 66.7*    Liver Enzymes Recent Labs  Lab 06/04/17 0508 06/05/17 0358 06/06/17 0500  AST 26 26 27   ALT 20 22 24   ALKPHOS 60 71 58  BILITOT 0.8 0.7 0.5  ALBUMIN 2.1* 2.3* 1.9*    Cardiac Enzymes Recent Labs  Lab 06/08/2017 2111 05/29/2017 2358 05/31/17 0606  TROPONINI 0.04* 0.03* <0.03    Glucose Recent Labs  Lab 06/05/17 0725 06/05/17 1141 06/05/17 1529 06/05/17 1923 06/05/17 2315 06/06/17 0309  GLUCAP 94 140* 127* 105* 106* 131*    Imaging Dg Chest Port 1 View  Result Date: 06/06/2017 CLINICAL DATA:  Pneumonia EXAM: PORTABLE CHEST 1 VIEW COMPARISON:  06/05/2017 FINDINGS: Support devices are stable. There is hyperinflation of the lungs compatible with COPD. Diffuse airspace disease throughout  the right lung with left basilar atelectasis or infiltrate as well. Layering bilateral effusions, right greater than left, likely not significantly changed. Heart is borderline in size. IMPRESSION: Diffuse right lung airspace disease and left basilar opacities. Layering bilateral effusions. No real change since prior study. COPD. Electronically Signed   By: Rolm Baptise M.D.   On: 06/06/2017 07:19   Dg Chest Port 1 View  Result Date: 06/05/2017 CLINICAL DATA:  History of small cell lung cancer with recent cough and shortness of Breath EXAM: PORTABLE CHEST 1 VIEW COMPARISON:  06/04/2017 FINDINGS: Persistent right-sided pleural effusion is noted. Infiltrative changes in the right lung are again seen similar to that on the prior examination. The left lung is well aerated. Mild persistent left basilar changes are noted.  Nasogastric catheter and endotracheal tube are seen. Right-sided PICC line is again noted and stable. IMPRESSION: No significant interval change from the previous day. Electronically Signed   By: Inez Catalina M.D.   On: 06/05/2017 09:43     STUDIES:  CT head 1/5: Negative  CULTURES: Blood cultures 1/5 positive for Streptococcus pneumoniae  ANTIBIOTICS: 8 azithromycin 1/5>>>1/9 Rocephin 1/5>>>  SIGNIFICANT EVENTS:   LINES/TUBES: OETT 1/8>>> picc RUE 1/10>>>  DISCUSSION: 84 y/o male with prior lung cancer here with severe community acquired pneumonia from strep pneumo.  Signs of improvement have been slow.   ASSESSMENT / PLAN:  PULMONARY: A: Acute respiratory failure with hypoxemia Severe community acquired pneumonia Likely acute pulmonary edema Plan: Full mechanical vent support VAP prevention Daily WUA/SBT Wean PEEP/FiO2 as able to maintain O2 saturation > 90% Explained to daughter today that this will take days to weeks, may need tracheostomy Lasix   Cardiovascular: Assessment:  Multifocal atrial tachycardia Plan: Telemetry monitoring Monitor hemodynamics Keep potassium greater than 4, magnesium greater than 2 Continue diltiazem  RENAL: A: Hypokalemia Hypomagnesemia: Plan: Replace potassium, replace mag Monitor BMET and UOP Replace electrolytes as needed  GASTROINTESTINAL: A: Nutrition needs P: Continue tube feding Continue PPI  ENDOCRINE: A: Hyperglycemia P: SSI q4h  ID: A:  Severe CAP: strep, bacteremia P: Continue ceftriaxone, expect long recovery course  NEURO: A: Sedation needs for vent synchrony P: RASS goal 0 to -1 precedex per PAD protocol, prn fentanyl, versed  Family: I updated the patient's daughter at length on January 12.  I explained that at this time he does not have evidence of multiorgan failure, just severe respiratory failure secondary to pneumonia.  At this point I think this will be a lengthy course of  recovery.  I explained that he may need to have a tracheostomy if he doesn't show rapid improvement this week.  My cc time 35 minutes  Roselie Awkward, MD [place] [facility] Pager: [phone] Cell: [phone] After 3pm or if no response, call [phone]  Pager # [phone] OR # [phone] if no answer

## 2017-06-06 NOTE — Progress Notes (Signed)
[facility] ADULT ICU REPLACEMENT PROTOCOL FOR AM LAB REPLACEMENT ONLY  The patient does apply for the [facility] Adult ICU Electrolyte Replacment Protocol based on the criteria listed below:   1. Is GFR >/= 40 ml/min? Yes.    Patient's GFR today is >60 2. Is urine output >/= 0.5 ml/kg/hr for the last 6 hours? Yes.   Patient's UOP is 1.1 ml/kg/hr 3. Is BUN < 60 mg/dL? Yes.    Patient's BUN today is 26 4. Abnormal electrolyte  K 3.1, Mg 1.5 5. Ordered repletion with: per protocol 6. If a panic level lab has been reported, has the CCM MD in charge been notified? Yes.  .   Physician:  Antoine Primas 06/06/2017 6:23 AM

## 2017-06-06 NOTE — Progress Notes (Signed)
[place] Progress Note Patient Name: Patrick Carr DOB: 10/07/1933 MRN: [id number]   Date of Service  06/06/2017  HPI/Events of Note  Hypotension - Likely related to sedation. Hgb = 9.8 and LVEF = 60-65%. History of MAT.  eICU Interventions  Will order: 1. Monitor CVP now and Q 4 hours.  2. Phenylephrine IV infusion. Titrate to MAP > 65.     Intervention Category Major Interventions: Hypotension - evaluation and management  Lysle Dingwall 06/06/2017, 9:26 PM

## 2017-06-07 ENCOUNTER — Inpatient Hospital Stay (HOSPITAL_COMMUNITY): Payer: Medicare Other

## 2017-06-07 LAB — CBC WITH DIFFERENTIAL/PLATELET
BASOS PCT: 0 %
Basophils Absolute: 0 10*3/uL (ref 0.0–0.1)
EOS ABS: 0.2 10*3/uL (ref 0.0–0.7)
EOS PCT: 1 %
HEMATOCRIT: 31.9 % — AB (ref 39.0–52.0)
Hemoglobin: 10.3 g/dL — ABNORMAL LOW (ref 13.0–17.0)
Lymphocytes Relative: 5 %
Lymphs Abs: 0.7 10*3/uL (ref 0.7–4.0)
MCH: 32 pg (ref 26.0–34.0)
MCHC: 32.3 g/dL (ref 30.0–36.0)
MCV: 99.1 fL (ref 78.0–100.0)
MONO ABS: 0.3 10*3/uL (ref 0.1–1.0)
MONOS PCT: 2 %
NEUTROS ABS: 12.8 10*3/uL — AB (ref 1.7–7.7)
Neutrophils Relative %: 92 %
PLATELETS: 213 10*3/uL (ref 150–400)
RBC: 3.22 MIL/uL — ABNORMAL LOW (ref 4.22–5.81)
RDW: 14.7 % (ref 11.5–15.5)
WBC: 13.9 10*3/uL — ABNORMAL HIGH (ref 4.0–10.5)

## 2017-06-07 LAB — BASIC METABOLIC PANEL
Anion gap: 9 (ref 5–15)
BUN: 29 mg/dL — AB (ref 6–20)
CALCIUM: 7.6 mg/dL — AB (ref 8.9–10.3)
CO2: 36 mmol/L — ABNORMAL HIGH (ref 22–32)
CREATININE: 0.65 mg/dL (ref 0.61–1.24)
Chloride: 95 mmol/L — ABNORMAL LOW (ref 101–111)
GFR calc Af Amer: >60 mL/min (ref >60)
GLUCOSE: 138 mg/dL — AB (ref 65–99)
Potassium: 3.6 mmol/L (ref 3.5–5.1)
SODIUM: 140 mmol/L (ref 135–145)

## 2017-06-07 LAB — GLUCOSE, CAPILLARY
GLUCOSE-CAPILLARY: 92 mg/dL (ref 65–99)
Glucose-Capillary: 124 mg/dL — ABNORMAL HIGH (ref 65–99)
Glucose-Capillary: 108 mg/dL — ABNORMAL HIGH (ref 65–99)
Glucose-Capillary: 135 mg/dL — ABNORMAL HIGH (ref 65–99)
Glucose-Capillary: 129 mg/dL — ABNORMAL HIGH (ref 65–99)
Glucose-Capillary: 126 mg/dL — ABNORMAL HIGH (ref 65–99)

## 2017-06-07 LAB — MAGNESIUM: MAGNESIUM: 2.5 mg/dL — AB (ref 1.7–2.4)

## 2017-06-07 MED ORDER — POTASSIUM CHLORIDE 20 MEQ/15ML (10%) PO SOLN
30.0000 meq | ORAL | Status: AC
  Administered 2017-06-07 (×2): 30 meq
  Filled 2017-06-07 (×2): qty 30

## 2017-06-07 MED ORDER — PHENYLEPHRINE HCL 10 MG/ML IJ SOLN
0.0000 ug/min | INTRAMUSCULAR | Status: DC
  Administered 2017-06-07: 110 ug/min via INTRAVENOUS
  Administered 2017-06-07: 60 ug/min via INTRAVENOUS
  Administered 2017-06-07 (×2): 90 ug/min via INTRAVENOUS
  Administered 2017-06-08 (×2): 60 ug/min via INTRAVENOUS
  Administered 2017-06-08: 40 ug/min via INTRAVENOUS
  Administered 2017-06-08: 110 ug/min via INTRAVENOUS
  Administered 2017-06-08: 80 ug/min via INTRAVENOUS
  Administered 2017-06-09: 120 ug/min via INTRAVENOUS
  Administered 2017-06-09: 140 ug/min via INTRAVENOUS
  Administered 2017-06-09: 110 ug/min via INTRAVENOUS
  Administered 2017-06-09: 70 ug/min via INTRAVENOUS
  Administered 2017-06-09: 60 ug/min via INTRAVENOUS
  Administered 2017-06-09: 70 ug/min via INTRAVENOUS
  Administered 2017-06-10: 100 ug/min via INTRAVENOUS
  Administered 2017-06-10: 40 ug/min via INTRAVENOUS
  Administered 2017-06-10: 60 ug/min via INTRAVENOUS
  Administered 2017-06-10: 90 ug/min via INTRAVENOUS
  Administered 2017-06-10: 110 ug/min via INTRAVENOUS
  Administered 2017-06-11: 70 ug/min via INTRAVENOUS
  Administered 2017-06-11: 80 ug/min via INTRAVENOUS
  Filled 2017-06-07 (×2): qty 20
  Filled 2017-06-07: qty 2
  Filled 2017-06-07: qty 20
  Filled 2017-06-07 (×8): qty 2
  Filled 2017-06-07: qty 20
  Filled 2017-06-07 (×8): qty 2
  Filled 2017-06-07: qty 20
  Filled 2017-06-07 (×3): qty 2
  Filled 2017-06-07 (×2): qty 20
  Filled 2017-06-07 (×5): qty 2

## 2017-06-07 MED ORDER — FENTANYL CITRATE (PF) 100 MCG/2ML IJ SOLN
100.0000 ug | Freq: Once | INTRAMUSCULAR | Status: DC
Start: 2017-06-07 — End: 2017-06-08

## 2017-06-07 MED ORDER — MIDAZOLAM HCL 2 MG/2ML IJ SOLN
4.0000 mg | Freq: Once | INTRAMUSCULAR | Status: AC
  Administered 2017-06-07: 4 mg via INTRAVENOUS

## 2017-06-07 MED ORDER — SODIUM CHLORIDE 0.9 % IV SOLN
1.0000 g | Freq: Three times a day (TID) | INTRAVENOUS | Status: DC
  Administered 2017-06-07 – 2017-06-14 (×21): 1 g via INTRAVENOUS
  Filled 2017-06-07 (×25): qty 1

## 2017-06-07 MED ORDER — FUROSEMIDE 10 MG/ML IJ SOLN
40.0000 mg | Freq: Four times a day (QID) | INTRAMUSCULAR | Status: AC
  Administered 2017-06-07 (×2): 40 mg via INTRAVENOUS
  Filled 2017-06-07 (×2): qty 4

## 2017-06-07 MED ORDER — MIDAZOLAM HCL 2 MG/2ML IJ SOLN
INTRAMUSCULAR | Status: AC
  Filled 2017-06-07: qty 4

## 2017-06-07 MED ORDER — FENTANYL CITRATE (PF) 100 MCG/2ML IJ SOLN
INTRAMUSCULAR | Status: AC
  Filled 2017-06-07: qty 2

## 2017-06-07 MED ORDER — VANCOMYCIN HCL IN DEXTROSE 1-5 GM/200ML-% IV SOLN
1000.0000 mg | INTRAVENOUS | Status: DC
  Administered 2017-06-07 – 2017-06-10 (×4): 1000 mg via INTRAVENOUS
  Filled 2017-06-07 (×4): qty 200

## 2017-06-07 NOTE — Progress Notes (Addendum)
PULMONARY / CRITICAL CARE MEDICINE   Name: Patrick Carr MRN: [id number] DOB: January 11, 1934    ADMISSION DATE:  06/12/2017 CONSULTATION DATE: 1/8  REFERRING MD:  Denton Brick  CHIEF COMPLAINT:  Delirium and hypoxia   BRIEF: 84 y/o male with a history of limited stage small cell lung cancer treated in 2015 is here now with acute respiratory failure with hypoxemia due to severe CAP. At baseline he remains active and lives independently.     SUBJECTIVE:  More episodes of hypoxemia Started on vasopressors   VITAL SIGNS: BP (!) 112/41   Pulse 67   Temp 99.7 F (37.6 C)   Resp 14   Ht 5\' 5"  (1.651 m)   Wt 60.8 kg (134 lb 0.6 oz)   SpO2 91%   BMI 22.31 kg/m   HEMODYNAMICS: CVP:  [6 mmHg-12 mmHg] 12 mmHg  VENTILATOR SETTINGS: Vent Mode: PRVC FiO2 (%):  [40 %-100 %] 80 % Set Rate:  [14 bmp] 14 bmp Vt Set:  [490 mL] 490 mL PEEP:  [5 cmH20-8 cmH20] 5 cmH20 Plateau Pressure:  [15 cmH20-22 cmH20] 18 cmH20  INTAKE / OUTPUT:  Intake/Output Summary (Last 24 hours) at 06/07/2017 0918 Last data filed at 06/07/2017 0600 Gross per 24 hour  Intake 3612.21 ml  Output 2950 ml  Net 662.21 ml     PHYSICAL EXAMINATION:  General:  In bed on vent HENT: NCAT ETT in place PULM: Diminished R base, vent supported breathing CV: RRR, no mgr GI: BS+, soft, nontender MSK: normal bulk and tone Neuro: sedated on vent      LABS:  BMET Recent Labs  Lab 06/05/17 0358 06/06/17 0500 06/07/17 0500  NA 142 139 140  K 3.6 3.1* 3.6  CL 107 98* 95*  CO2 31 34* 36*  BUN 22* 26* 29*  CREATININE 0.64 0.59* 0.65  GLUCOSE 151* 138* 138*    Electrolytes Recent Labs  Lab 06/03/17 1632 06/04/17 0508 06/04/17 1658 06/05/17 0358 06/06/17 0500 06/07/17 0500  CALCIUM  --  8.5* 8.4* 8.1* 7.3* 7.6*  MG 1.9 1.7 1.9  --  1.5* 2.5*  PHOS 1.2* 1.1* 2.2*  --   --   --     CBC Recent Labs  Lab 06/06/17 0500 06/06/17 1832 06/07/17 0500  WBC 7.4 8.7 13.9*  HGB 9.4* 9.8* 10.3*  HCT 28.7*  30.2* 31.9*  PLT 122* 147* 213    Coag's No results for input(s): APTT, INR in the last 168 hours.  Sepsis Markers Recent Labs  Lab 06/02/17 1221  LATICACIDVEN 0.8    ABG Recent Labs  Lab 06/03/17 0852 06/05/17 0435 06/05/17 1100  PHART 7.443 7.413 7.465*  PCO2ART 33.7 46.4 42.8  PO2ART 51.1* 59.9* 66.7*    Liver Enzymes Recent Labs  Lab 06/04/17 0508 06/05/17 0358 06/06/17 0500  AST 26 26 27   ALT 20 22 24   ALKPHOS 60 71 58  BILITOT 0.8 0.7 0.5  ALBUMIN 2.1* 2.3* 1.9*    Cardiac Enzymes No results for input(s): TROPONINI, PROBNP in the last 168 hours.  Glucose Recent Labs  Lab 06/06/17 1134 06/06/17 1557 06/06/17 2041 06/06/17 2335 06/07/17 0338 06/07/17 0752  GLUCAP 148* 136* 123* 126* 124* 108*    Imaging Dg Chest Port 1 View  Result Date: 06/07/2017 CLINICAL DATA:  Acute respiratory failure. EXAM: PORTABLE CHEST 1 VIEW COMPARISON:  One-view chest x-ray 06/06/2017. FINDINGS: Endotracheal tube, NG tube, and right-sided PICC line are stable. Interstitial edema is similar to the prior study. Right greater than left  pleural effusions are again noted. Asymmetric right-sided airspace disease is present. IMPRESSION: 1. Similar appearance of right greater than left pleural effusion and airspace disease. While this may represent edema and atelectasis, infection is not excluded. 2. The support apparatus is stable. Electronically Signed   By: San Morelle M.D.   On: 06/07/2017 06:54     STUDIES:  1/6 Echo: >LVEF 55-60%, RVSP 51 mmHg 1/8 CT chest > dense consolidation RML and RLL, small effusions, emphysema 1/9 bronchoscopy > airways within normal limits, no mass; bloody secretions noted CT head 1/5: Negative 1/13 bedside ultrasound> small right effusion   CULTURES: Blood cultures 1/5 positive for Streptococcus pneumoniae Resp culture 1/9 > GPC, rare candida Bronch wash culture 1/13 >  Blood culture 1/13 >   ANTIBIOTICS: 8 azithromycin  1/5>>>1/9 Rocephin 1/5>>> 1/13 Vanc 1/13 >  Mero 1/13 >   SIGNIFICANT EVENTS:   LINES/TUBES: OETT 1/8>>> picc RUE 1/10>>>  DISCUSSION: 84 y/o male with prior lung cancer here with severe community acquired pneumonia from strep pneumo.  As of 1/13 has low grade fever, worsening episodes of hypoxemia.     ASSESSMENT / PLAN:  PULMONARY: A: Acute respiratory failure with hypoxemia > worsening 1/13 mucus plugging? Worsening pulm edema? Severe community acquired pneumonia from strep pneumo Likely acute pulmonary edema Small right effusion R chest, doesn't explain hypoxemia Repeat bronchoscopy 1/13> some bloody secretions, no mucus plugging Plan: Continue diuresis Full mechanical vent support VAP prevention Daily WUA/SBT Ultrasound chest today to look for effusion R chest Continue pulmonary toilette Adjust PEEP/FiO2 to maintain O2 saturation > 90%  Cardiovascular: Assessment:  Multifocal atrial tachycardia Circulatory shock 1/13, presumably sedation related as doesn't appear septic and physical exam consistent with volume overload Plan: Tele Monitor hemodynamics Keep K > 4, Mg > 2 Hold diltiazem Continue neosynephrine diurese  RENAL: A: Hypokalemia Plan: Monitor BMET and UOP Replace electrolytes as needed   GASTROINTESTINAL: A: Nutrition needs P: Continue tube feeding Continue famotidine  ENDOCRINE: A: Hyperglycemia P: SSI q4h  ID: A:  Severe CAP: strep, bacteremia > low grade fever 1/13, still could be due to strep but will cover for HCAP given worsening hypoxemia P: Stop ceftriaxone Start meropenem Start vanc Re-culture Ultrasound R chest  NEURO: A: Sedation needs for vent synchrony P: RASS goal 0 to -1 Precedex per PAD protoco, fentanyl, versed prn  Family: See discussion from 1/12 note Family updated again on 1/13  My cc time 35 minutes  Roselie Awkward, MD [place] [facility] Pager: [phone] Cell: [phone] After 3pm or if no  response, call [phone]  Pager # [phone] OR # [phone] if no answer

## 2017-06-07 NOTE — Procedures (Signed)
PCCM Video Bronchoscopy Procedure Note  The patient was informed of the risks (including but not limited to bleeding, infection, respiratory failure, lung injury, tooth/oral injury) and benefits of the procedure and gave consent, see chart.  Indication: Worsening respiratory failure with hypoxemia  Post Procedure Diagnosis: Severe CAP, thick bloody secretions but no significant mucus plugging  Location: [facility] ICU  Condition pre procedure: Critically ill on ventilator  Medications for procedure: Versed 4mg  IV injection, Fentanyl 159mcg IV bolus and infusion, precedex infusion  Procedure description: The bronchoscope was introduced through the endotracheal tube and passed to the bilateral lungs to the level of the subsegmental bronchi throughout the tracheobronchial tree.  Airway exam revealed normal trachea, normal anatomy in left lung without airway lesions.  Bloody secretions in the right lung, particularly the RML and RLL were noticed but no mucus plugging or airway occlusion noted.   Procedures performed: none  Specimens sent: secretions from the right lower lobe  Condition post procedure: critically ill, on vent  EBL: none  Complications: none  Roselie Awkward, MD [place] [facility] Pager: [phone] Cell: [phone] After 3pm or if no response, call [phone]

## 2017-06-07 NOTE — Progress Notes (Signed)
RT called to bedside due to pt desating.  RT bagged and lavaged pt X'2 for thin blood tinged secretions, no mucus plugs identified.  Pt placed back on Vent on 100% FIO2.  Pt currently sating 94%, RT to wean O2 as tolerated.  RT to monitor and assess as needed.

## 2017-06-07 NOTE — Progress Notes (Signed)
Rt help MD with bedside bronch. Pt had no adverse reaction to therapy.

## 2017-06-07 NOTE — Progress Notes (Signed)
Pharmacy Antibiotic Note  Patrick Carr is a 83 y.o. male admitted on 06/25/2017  history of limited stage small cell lung cancer treated in 2015 is here now with acute respiratory failure with hypoxemia due to severe CAP.  Pt has been on CTX now pharmacy has been consulted for vancomycin and merrem dosing for HCAP.  Plan: Vancomycin 1gm IV q24h (AUC 451.1) Merrem 1gm IV q8h Follow renal function, cultures and clinical course  Height: _0  (165.1 cm) Weight: 134 lb 0.6 oz (60.8 kg) IBW/kg (Calculated) : 61.5  Temp (24hrs), Avg:99.9 F (37.7 C), Min:99 F (37.2 C), Max:100.6 F (38.1 C)  Recent Labs  Lab 06/02/17 1221  06/04/17 0508 06/04/17 1658 06/05/17 0358 06/06/17 0500 06/06/17 1832 06/07/17 0500  WBC  --    < > 4.0  --  6.4 7.4 8.7 13.9*  CREATININE  --    < > 0.84 0.74 0.64 0.59*  --  0.65  LATICACIDVEN 0.8  --   --   --   --   --   --   --    < > = values in this interval not displayed.    Estimated Creatinine Clearance: 62.3 mL/min (by C-G formula based on SCr of 0.65 mg/dL).    Allergies  Allergen Reactions  . Contrast Media [Iodinated Diagnostic Agents]   . Other Other (See Comments)    Pneumonia vaccine causes arm swelling, caused redness  . Pneumococcal Vaccines     Arm swelling    Antimicrobials this admission: 1/6 ceftriaxone >> 1/13 1/6 azith >> 1/8 1/13 vanc >> 1/13 merrem >> Dose adjustments this admission:   Microbiology results: 1/5 BCx: s. Pneumoniae (PCN susc) 1/9 resp cx (BAL) 1/13 BCx:  Thank you for allowing pharmacy to be a part of this patient's care.  Dolly Rias RPh 06/07/2017, 9:38 AM Pager [phone]

## 2017-06-07 NOTE — Progress Notes (Signed)
Pharmacy - brief note  RN asked to concentrate phenylephrine gtt due to increase pressor requirements.    - will use double strength concentration at this time - RN aware to adjust Alaris pump setting with new bag  Doreene Eland, PharmD, BCPS.   06/07/2017 7:28 AM

## 2017-06-08 ENCOUNTER — Inpatient Hospital Stay (HOSPITAL_COMMUNITY): Payer: Medicare Other

## 2017-06-08 DIAGNOSIS — A408 Other streptococcal sepsis: Secondary | ICD-10-CM

## 2017-06-08 LAB — BASIC METABOLIC PANEL
ANION GAP: 4 — AB (ref 5–15)
BUN: 23 mg/dL — ABNORMAL HIGH (ref 6–20)
CALCIUM: 5.9 mg/dL — AB (ref 8.9–10.3)
CO2: 30 mmol/L (ref 22–32)
Chloride: 108 mmol/L (ref 101–111)
Creatinine, Ser: 0.53 mg/dL — ABNORMAL LOW (ref 0.61–1.24)
GFR calc Af Amer: >60 mL/min (ref >60)
GLUCOSE: 127 mg/dL — AB (ref 65–99)
POTASSIUM: 3.1 mmol/L — AB (ref 3.5–5.1)
SODIUM: 142 mmol/L (ref 135–145)

## 2017-06-08 LAB — CBC WITH DIFFERENTIAL/PLATELET
BASOS ABS: 0 10*3/uL (ref 0.0–0.1)
BASOS PCT: 0 %
EOS PCT: 2 %
Eosinophils Absolute: 0.3 10*3/uL (ref 0.0–0.7)
HCT: 32.6 % — ABNORMAL LOW (ref 39.0–52.0)
Hemoglobin: 10.1 g/dL — ABNORMAL LOW (ref 13.0–17.0)
LYMPHS PCT: 7 %
Lymphs Abs: 0.8 10*3/uL (ref 0.7–4.0)
MCH: 31.4 pg (ref 26.0–34.0)
MCHC: 31 g/dL (ref 30.0–36.0)
MCV: 101.2 fL — AB (ref 78.0–100.0)
MONO ABS: 0.3 10*3/uL (ref 0.1–1.0)
Monocytes Relative: 3 %
NEUTROS ABS: 10.7 10*3/uL — AB (ref 1.7–7.7)
Neutrophils Relative %: 88 %
Platelets: 224 10*3/uL (ref 150–400)
RBC: 3.22 MIL/uL — AB (ref 4.22–5.81)
RDW: 14.8 % (ref 11.5–15.5)
WBC: 12.1 10*3/uL — AB (ref 4.0–10.5)

## 2017-06-08 LAB — GLUCOSE, CAPILLARY
GLUCOSE-CAPILLARY: 108 mg/dL — AB (ref 65–99)
GLUCOSE-CAPILLARY: 117 mg/dL — AB (ref 65–99)
GLUCOSE-CAPILLARY: 121 mg/dL — AB (ref 65–99)
Glucose-Capillary: 125 mg/dL — ABNORMAL HIGH (ref 65–99)
Glucose-Capillary: 97 mg/dL (ref 65–99)
Glucose-Capillary: 99 mg/dL (ref 65–99)

## 2017-06-08 MED ORDER — POTASSIUM CHLORIDE 20 MEQ/15ML (10%) PO SOLN
40.0000 meq | Freq: Two times a day (BID) | ORAL | Status: AC
  Administered 2017-06-08 (×2): 40 meq
  Filled 2017-06-08 (×2): qty 30

## 2017-06-08 MED ORDER — OSMOLITE 1.2 CAL PO LIQD
1000.0000 mL | ORAL | Status: DC
  Administered 2017-06-08 – 2017-06-12 (×7): 1000 mL
  Filled 2017-06-08 (×2): qty 1000

## 2017-06-08 NOTE — Progress Notes (Signed)
Date:  June 08, 2017 Chart reviewed for concurrent status and case management needs.  Will continue to follow patient progress. Continues on full vent support s/p bronch from [id number] secretions but no mucous pluggings. Discharge Planning: following for needs.  None present at this time of review. Expected discharge date: January 172019 Rhonda Davis, BSN, [place], [place]

## 2017-06-08 NOTE — Progress Notes (Signed)
CRITICAL VALUE ALERT  Critical Value:  Ca+ 5.9  Date & Time Notied: 0614  06/08/2017   Provider Notified: E-link  Orders Received/Actions taken: Awaiting further orders.

## 2017-06-08 NOTE — Progress Notes (Signed)
RT bagged and lavage the Pt 2 times within a 30 minute time span. Pt had red/white secretions. RT had to go up on the Pt's FIO2 to 100%. RT paged Dr. Lamonte Sakai

## 2017-06-08 NOTE — Progress Notes (Signed)
PULMONARY / CRITICAL CARE MEDICINE   Name: Patrick Carr MRN: [id number] DOB: [birth year]    ADMISSION DATE:  06/23/2017 CONSULTATION DATE: 1/8  REFERRING MD:  Denton Brick  CHIEF COMPLAINT:  Delirium and hypoxia   BRIEF: 84 y/o male with a history of limited stage small cell lung cancer treated in 2015 is here now with acute respiratory failure with hypoxemia due to severe CAP. At baseline he remains active and lives independently.     SUBJECTIVE:  Tmax 100.8.  Remains on 12 PEEP / 80% FiO2.  On Neo @ 60 mcg's, fentanyl 175 mcg's, precedex 0.6.     VITAL SIGNS: BP (!) 115/59   Pulse 68   Temp 99.5 F (37.5 C)   Resp (!) 69   Ht 5\' 5"  (1.651 m)   Wt 130 lb 15.3 oz (59.4 kg)   SpO2 100%   BMI 21.79 kg/m   HEMODYNAMICS: CVP:  [9 mmHg-12 mmHg] 9 mmHg  VENTILATOR SETTINGS: Vent Mode: PRVC FiO2 (%):  [60 %-100 %] 80 % Set Rate:  [14 bmp] 14 bmp Vt Set:  [490 mL] 490 mL PEEP:  [10 AYT01-60 cmH20] 10 cmH20 Plateau Pressure:  [19 cmH20-24 cmH20] 23 cmH20  INTAKE / OUTPUT:  Intake/Output Summary (Last 24 hours) at 06/08/2017 0953 Last data filed at 06/08/2017 0800 Gross per 24 hour  Intake 3961.89 ml  Output 3100 ml  Net 861.89 ml     PHYSICAL EXAMINATION: General: elderly male lying in bed on vent, NAD HEENT: MM pink/moist, ETT, arcus senilis  Neuro: sedate, no response to verbal stimuli CV: s1s2 rrr, SR on monitor 60's, no m/r/g PULM: even/non-labored, lungs bilaterally with faint wheeze  FU:XNAT, non-tender, bsx4 active  Extremities: warm/dry, RUE > LUE edema, PICC in RUE Skin: no rashes or lesions  LABS:  BMET Recent Labs  Lab 06/06/17 0500 06/07/17 0500 06/08/17 0450  NA 139 140 142  K 3.1* 3.6 3.1*  CL 98* 95* 108  CO2 34* 36* 30  BUN 26* 29* 23*  CREATININE 0.59* 0.65 0.53*  GLUCOSE 138* 138* 127*    Electrolytes Recent Labs  Lab 06/03/17 1632 06/04/17 0508 06/04/17 1658  06/06/17 0500 06/07/17 0500 06/08/17 0450  CALCIUM  --  8.5* 8.4*    < > 7.3* 7.6* 5.9*  MG 1.9 1.7 1.9  --  1.5* 2.5*  --   PHOS 1.2* 1.1* 2.2*  --   --   --   --    < > = values in this interval not displayed.    CBC Recent Labs  Lab 06/06/17 1832 06/07/17 0500 06/08/17 0450  WBC 8.7 13.9* 12.1*  HGB 9.8* 10.3* 10.1*  HCT 30.2* 31.9* 32.6*  PLT 147* 213 224    Coag's No results for input(s): APTT, INR in the last 168 hours.  Sepsis Markers Recent Labs  Lab 06/02/17 1221  LATICACIDVEN 0.8    ABG Recent Labs  Lab 06/03/17 0852 06/05/17 0435 06/05/17 1100  PHART 7.443 7.413 7.465*  PCO2ART 33.7 46.4 42.8  PO2ART 51.1* 59.9* 66.7*    Liver Enzymes Recent Labs  Lab 06/04/17 0508 06/05/17 0358 06/06/17 0500  AST 26 26 27   ALT 20 22 24   ALKPHOS 60 71 58  BILITOT 0.8 0.7 0.5  ALBUMIN 2.1* 2.3* 1.9*    Cardiac Enzymes No results for input(s): TROPONINI, PROBNP in the last 168 hours.  Glucose Recent Labs  Lab 06/07/17 1144 06/07/17 1612 06/07/17 1909 06/07/17 2303 06/08/17 0302 06/08/17 0714  GLUCAP 135*  129* 92 126* 125* 108*    Imaging Dg Chest Port 1 View  Result Date: 06/08/2017 CLINICAL DATA:  Hypoxia EXAM: PORTABLE CHEST 1 VIEW COMPARISON:  June 07, 2017 FINDINGS: Endotracheal tube tip is 4.8 cm above the carina. Central catheter tip is in the superior vena cava. Nasogastric tube tip and side port are below the diaphragm. No pneumothorax. There is moderate pleural effusion on the right. There is extensive interstitial and alveolar opacity throughout the right lung, stable. Left lung is clear. Note that a small portion of the left base is not imaged. Heart is borderline enlarged with pulmonary venous hypertension. There is aortic atherosclerosis. No focal bone lesions evident. IMPRESSION: Tube and catheter positions as described without pneumothorax. Pleural effusion on the right with extensive interstitial alveolar consolidation. Question asymmetric pulmonary edema versus pneumonia. Both entities may well exist  concurrently. Visualized left lung is clear. Stable cardiac silhouette. There is aortic atherosclerosis. Aortic Atherosclerosis (ICD10-I70.0). Electronically Signed   By: Lowella Grip III M.D.   On: 06/08/2017 07:05     STUDIES:  1/06 Echo > LVEF 55-60%, RVSP 51 mmHg 1/08 CT chest > dense consolidation RML and RLL, small effusions, emphysema 1/09 bronchoscopy > airways within normal limits, no mass; bloody secretions noted 1/05 CT head > Negative 1/13 bedside ultrasound > small right effusion 1/13 FOB > bloody secretions in R lung, particularly RML / RLL but no mucus plugging / occlusion   CULTURES: Blood cultures 1/5 > Streptococcus pneumoniae Resp culture 1/9 > GPC, rare candida Bronch wash culture 1/13 >  Blood culture 1/13 >   ANTIBIOTICS: 8 azithromycin 1/5 > 1/9 Rocephin 1/5 > 1/13 Vanc 1/13 >  Mero 1/13 >   SIGNIFICANT EVENTS: 1/05  Admit with strep pneumo CAP  1/13 More episodes of hypoxemia, FOB, started on vasopressors  LINES/TUBES: OETT 1/8 >> RUE PICC 1/10 >>  DISCUSSION: 84 y/o male with prior lung cancer here with severe community acquired pneumonia from strep pneumo with associated bacteremia.  As of 1/13 has low grade fever, worsening episodes of hypoxemia.     ASSESSMENT / PLAN:  PULMONARY: A: Acute respiratory failure with hypoxemia > worsening 1/13 mucus plugging? Worsening pulm edema? Severe community acquired pneumonia from strep pneumo Likely acute pulmonary edema Small right effusion R chest, doesn't explain hypoxemia Repeat bronchoscopy 1/13> some bloody secretions, no mucus plugging P: PRVC 8 cc/kg  Wean PEEP / FiO2 for sats > 90% May need to transition to ARDS settings / low Vt ventilation  Daily WUA / SBT as able  Follow CXR  VAP prevention measures  Follow cultures  Xopenex TID + PRN   CARDIOVASCULAR: A:  Multifocal atrial tachycardia - resolved 1/14 Circulatory shock 1/13, presumably sedation related as doesn't appear septic  and physical exam consistent with volume overload P: Tele monitornig  Keep K >4, Mg >2 Neo for MAP >65  Continue lipitor  PRN hydralazine for SBP > 170 ASA 81 mg QD  RENAL: A: Hypokalemia P: Trend BMP / urinary output Free water 200 ml Q4  Replace electrolytes as indicated, KCL 40 mEq BID x2 doses  Avoid nephrotoxic agents, ensure adequate renal perfusion  GASTROINTESTINAL: A: Nutrition needs P: Continue TF  Protonix for SUP   ENDOCRINE: A: Hyperglycemia P: SSI Q4   ID: A:  Strep Pneumoniae CAP with Bacteremia Fever 1/13 - still could be due to strep but will cover for HCAP given worsening hypoxemia P: ABX as above  Follow cultures   NEURO: A: Sedation  needs for vent synchrony P: RASS goal: 0 to -1  Precedex, Fentanyl gtt for pain / sedation  PRN versed   Family:  No family at bedside 1/14 am.  See prior discussion from 1/12 per Dr. Lake Bells.    Noe Gens, NP-C [facility] Pgr: [phone] or if no answer [phone] 06/08/2017, 9:53 AM

## 2017-06-08 NOTE — Progress Notes (Addendum)
Nutrition Follow-up  DOCUMENTATION CODES:   Non-severe (moderate) malnutrition in context of chronic illness  INTERVENTION:  - Will adjust TF regimen: will d/c Prostat and will increase Osmolite 1.2 to 55 mL/hr. This regimen will provide 1584 kcal, 73 grams of protein, and 1082 mL free water.  - Free water flush to continue to be per MD/NP.  Monitor magnesium, potassium, and phosphorus daily for at least 3 days, MD to replete as needed, as pt is at risk for refeeding syndrome given malnutrition, current hypokalemia prior to TF rate increase.    NUTRITION DIAGNOSIS:   Moderate Malnutrition related to chronic illness, cancer and cancer related treatments as evidenced by energy intake < or equal to 50% for > or equal to 1 month, mild fat depletion, moderate muscle depletion, severe muscle depletion. -ongoing  GOAL:   Patient will meet greater than or equal to 90% of their needs -met with TF regimen   MONITOR:   Vent status, TF tolerance, Weight trends, Labs  ASSESSMENT:   Pt with PMH significant for BPH, right small cell lung cancer s/p chemotherapy (04/2014), HLD, HTN. Presents to ED with complaints of 3-4 weeks history of cough, SOB, and malaise. Admitted for sepsis secondary to pulmonary source. Pt transferred to SDU with new onset a-fib requiring Cardizem gtt, intubated due to impending respiratory failure 1/8.   1/14 Pt remains intubated with OGT in place. He is receiving Osmolite 1.2 @ 40 mL/hr with 30 mL Prostat TID and 200 mL free water every 4 hours. This regimen is providing 1452 kcal (72% re-estimated kcal need), 98 grams of protein, and 1987 mL free water. Estimated nutrition needs updated and based on current weight (59.4 kg).   Noted weight +17 lbs/7.7 kg from 1/6-1/10 and then -4 lbs/1.8 kg from 1/10-this AM. Weight from 1/10-today is consistent with previous UBW, prior to cancer dx; will continue to monitor weight trends closely. Free water flush ordered by CCM team on  1/10. Pt became agitated after RT lavaged him so unable to complete fuller assessment of muscle and fat stores.   Per PCCM NP note this AM, pt had bloody secretions in R lung yesterday without mucus plugging present during repeat bronch. He may need to transition to ARDS vent settings/ARDS protocol. Goal MAp: >65.   Patient is currently intubated on ventilator support MV: 7.7 L/min Temp (24hrs), Avg:99.7 F (37.6 C), Min:98.8 F (37.1 C), Max:100.8 F (38.2 C) BP: 172/74 and MAP: 108  Medications reviewed; sliding scale Novolog, 40 mg IV Protonix/day, 40 mEq KCl per OGT x2 doses today, 1 tablet Senokot per OGT BID. Labs reviewed; CBGs: 125, 108, and 97 mg/dL this AM, K: 3.1 mmol/L, BUN: 23 mg/dL, creatinine: 0.53 mg/dL, Ca: 5.9 mg/dL.   IVF: LR @ 10 mL/hr.  Drips: Fentanyl @ 175 mcg/hr, Neo @ 30 mcg/min, and Precedex @ 0.7 mcg/kg/hr.        1/9 - Pt started on PePup protocol.  - Spoke with daughter at bedside.  - She is unsure of appetite recently as she was on vacation. - Pt is not a very big eater, typically consumes 2 meals per day.  - Breakfast is usually larger with eggs and oatmeal.  - Dinner is smaller with a meat, vegetable, and grain.  - Pt previously consumed Ensure but stopped over the last couple of months.  - Daughter reports pt has never weighed over 135 lb his entire life and has remained around 125 lb ever since his cancer diagnosis in 2015. -  Records indicate pt weighed 124 lb in 07/2016 and 117 lb this admission.  - This shows a 5.6% weight loss in 10 months. Insignificant for time frame.  - Pt shows moderate muscle depletions.  - Unable to assess all regions at this time.   Patient is currently intubated on ventilator support MV: 14.6 L/min Temp (24hrs), Avg:97.6 F (36.4 C), Min:94.8 F (34.9 C), Max:100 F (37.8 C) BP: 160/84 MAP: 106 Propofol: None     Diet Order:  Diet NPO time specified  EDUCATION NEEDS:   Not appropriate for education at  this time  Skin:  Skin Assessment: Reviewed RN Assessment  Last BM:  1/13  Height:   Ht Readings from Last 1 Encounters:  06/02/17 _0  (1.651 m)    Weight:   Wt Readings from Last 1 Encounters:  06/08/17 130 lb 15.3 oz (59.4 kg)    Ideal Body Weight:  61.8 kg  BMI:  Body mass index is 21.79 kg/m.  Estimated Nutritional Needs:   Kcal:  1589  Protein:  71-89 grams (1.2-1.5 grams/kg)  Fluid:  >1.4 L/day      Jarome Matin, MS, RD, LDN, [facility] Inpatient Clinical Dietitian Pager # [phone] After hours/weekend pager # [phone]

## 2017-06-09 ENCOUNTER — Inpatient Hospital Stay (HOSPITAL_COMMUNITY): Payer: Medicare Other

## 2017-06-09 LAB — BASIC METABOLIC PANEL
ANION GAP: 5 (ref 5–15)
BUN: 25 mg/dL — ABNORMAL HIGH (ref 6–20)
CO2: 35 mmol/L — ABNORMAL HIGH (ref 22–32)
Calcium: 7.8 mg/dL — ABNORMAL LOW (ref 8.9–10.3)
Chloride: 100 mmol/L — ABNORMAL LOW (ref 101–111)
Creatinine, Ser: 0.61 mg/dL (ref 0.61–1.24)
GFR calc Af Amer: >60 mL/min (ref >60)
GLUCOSE: 128 mg/dL — AB (ref 65–99)
POTASSIUM: 5.1 mmol/L (ref 3.5–5.1)
SODIUM: 140 mmol/L (ref 135–145)

## 2017-06-09 LAB — GLUCOSE, CAPILLARY
GLUCOSE-CAPILLARY: 119 mg/dL — AB (ref 65–99)
GLUCOSE-CAPILLARY: 120 mg/dL — AB (ref 65–99)
GLUCOSE-CAPILLARY: 119 mg/dL — AB (ref 65–99)
Glucose-Capillary: 102 mg/dL — ABNORMAL HIGH (ref 65–99)
Glucose-Capillary: 143 mg/dL — ABNORMAL HIGH (ref 65–99)
Glucose-Capillary: 80 mg/dL (ref 65–99)

## 2017-06-09 LAB — CBC
HCT: 30.3 % — ABNORMAL LOW (ref 39.0–52.0)
Hemoglobin: 9.7 g/dL — ABNORMAL LOW (ref 13.0–17.0)
MCH: 32.6 pg (ref 26.0–34.0)
MCHC: 32 g/dL (ref 30.0–36.0)
MCV: 101.7 fL — ABNORMAL HIGH (ref 78.0–100.0)
Platelets: 217 10*3/uL (ref 150–400)
RBC: 2.98 MIL/uL — AB (ref 4.22–5.81)
RDW: 15.1 % (ref 11.5–15.5)
WBC: 9.5 10*3/uL (ref 4.0–10.5)

## 2017-06-09 LAB — BLOOD GAS, ARTERIAL
Acid-Base Excess: 6.7 mmol/L — ABNORMAL HIGH (ref 0.0–2.0)
BICARBONATE: 33.9 mmol/L — AB (ref 20.0–28.0)
Drawn by: 441261
FIO2: 100
LHR: 22 {breaths}/min
O2 SAT: 95.8 %
PEEP: 14 cmH2O
PH ART: 7.329 — AB (ref 7.350–7.450)
Patient temperature: 98.6
VT: 370 mL
pCO2 arterial: 66.2 mmHg (ref 32.0–48.0)
pO2, Arterial: 85.4 mmHg (ref 83.0–108.0)

## 2017-06-09 LAB — MAGNESIUM: MAGNESIUM: 2.1 mg/dL (ref 1.7–2.4)

## 2017-06-09 LAB — PHOSPHORUS: PHOSPHORUS: 2.3 mg/dL — AB (ref 2.5–4.6)

## 2017-06-09 MED ORDER — MIDAZOLAM HCL 50 MG/10ML IJ SOLN
1.0000 mg/h | INTRAMUSCULAR | Status: DC
  Administered 2017-06-09: 1 mg/h via INTRAVENOUS
  Administered 2017-06-10 – 2017-06-11 (×2): 6 mg/h via INTRAVENOUS
  Administered 2017-06-12 (×2): 7 mg/h via INTRAVENOUS
  Administered 2017-06-13: 5 mg/h via INTRAVENOUS
  Filled 2017-06-09 (×5): qty 20

## 2017-06-09 NOTE — Progress Notes (Signed)
[place] Progress Note Patient Name: TRISTEN LUCE DOB: [birth year] MRN: [id number]   Date of Service  06/09/2017  HPI/Events of Note  Request for order for recruitment maneuver.   eICU Interventions  Will order Recruitment maneuver PRN.      Intervention Category Major Interventions: Hypoxemia - evaluation and management  Brenna Friesenhahn [place] 06/09/2017, 1:11 AM

## 2017-06-09 NOTE — Progress Notes (Signed)
Patient vent settings titrated to 70% and +12 PEEP to maintain O2 sats of 92-94%. RN at bedside. RT will continue to monitor patient.

## 2017-06-09 NOTE — Progress Notes (Signed)
PULMONARY / CRITICAL CARE MEDICINE   Name: Patrick Carr MRN: [id number] DOB: March 31, 1934    ADMISSION DATE:  06/09/2017 CONSULTATION DATE: 1/8  REFERRING MD:  Patrick Carr  CHIEF COMPLAINT:  Delirium and hypoxia   BRIEF: 84 y/o male with a history of limited stage small cell lung cancer treated in 2015 is here now with acute respiratory failure with hypoxemia due to severe CAP. At baseline he remains active and lives independently.     SUBJECTIVE:   Pt had been weaned to 0.60, now back up to 1.00. He desaturates w any activity, agitation Seems to get the best response from versed.    VITAL SIGNS: BP (!) 74/33   Pulse 84   Temp 98.8 F (37.1 C)   Resp 15   Ht 5\' 5"  (1.651 m)   Wt 61.8 kg (136 lb 3.9 oz)   SpO2 95%   BMI 22.67 kg/m   HEMODYNAMICS: CVP:  [5 mmHg-11 mmHg] 11 mmHg  VENTILATOR SETTINGS: Vent Mode: PRVC FiO2 (%):  [60 %-100 %] 100 % Set Rate:  [14 bmp] 14 bmp Vt Set:  [490 mL] 490 mL PEEP:  [5 PJA25-05 cmH20] 12 cmH20 Plateau Pressure:  [20 cmH20-26 cmH20] 20 cmH20  INTAKE / OUTPUT:  Intake/Output Summary (Last 24 hours) at 06/09/2017 1017 Last data filed at 06/09/2017 0830 Gross per 24 hour  Intake 3823.34 ml  Output 1765 ml  Net 2058.34 ml     PHYSICAL EXAMINATION: General: ill appearing man, sedated and ventilated HEENT: MM pink/moist, ETT, arcus senilis  Neuro: sedated, some grimace to voice, moves UE's CV: regular, no M PULM: mostly clear insp with B exp squeak [id number], non-distended  Extremities: warm/dry, some RUE edema, PICC in place Skin: no rashes or lesions  LABS:  BMET Recent Labs  Lab 06/07/17 0500 06/08/17 0450 06/09/17 0328  NA 140 142 140  K 3.6 3.1* 5.1  CL 95* 108 100*  CO2 36* 30 35*  BUN 29* 23* 25*  CREATININE 0.65 0.53* 0.61  GLUCOSE 138* 127* 128*    Electrolytes Recent Labs  Lab 06/04/17 0508 06/04/17 1658  06/06/17 0500 06/07/17 0500 06/08/17 0450 06/09/17 0328  CALCIUM 8.5* 8.4*   < > 7.3* 7.6*  5.9* 7.8*  MG 1.7 1.9  --  1.5* 2.5*  --  2.1  PHOS 1.1* 2.2*  --   --   --   --  2.3*   < > = values in this interval not displayed.    CBC Recent Labs  Lab 06/07/17 0500 06/08/17 0450 06/09/17 0328  WBC 13.9* 12.1* 9.5  HGB 10.3* 10.1* 9.7*  HCT 31.9* 32.6* 30.3*  PLT 213 224 217    Coag's No results for input(s): APTT, INR in the last 168 hours.  Sepsis Markers Recent Labs  Lab 06/02/17 1221  LATICACIDVEN 0.8    ABG Recent Labs  Lab 06/03/17 0852 06/05/17 0435 06/05/17 1100  PHART 7.443 7.413 7.465*  PCO2ART 33.7 46.4 42.8  PO2ART 51.1* 59.9* 66.7*    Liver Enzymes Recent Labs  Lab 06/04/17 0508 06/05/17 0358 06/06/17 0500  AST 26 26 27   ALT 20 22 24   ALKPHOS 60 71 58  BILITOT 0.8 0.7 0.5  ALBUMIN 2.1* 2.3* 1.9*    Cardiac Enzymes No results for input(s): TROPONINI, PROBNP in the last 168 hours.  Glucose Recent Labs  Lab 06/08/17 1112 06/08/17 1525 06/08/17 1939 06/08/17 2311 06/09/17 0337 06/09/17 0718  GLUCAP 97 99 117* 121* 119* 102*  Imaging Dg Chest Port 1 View  Result Date: 06/09/2017 CLINICAL DATA:  Respiratory failure. EXAM: PORTABLE CHEST 1 VIEW COMPARISON:  Radiograph June 08, 2017. FINDINGS: Stable cardiomediastinal silhouette. Atherosclerosis of thoracic aorta is noted. Endotracheal and nasogastric tubes are unchanged in position. Right-sided PICC line is unchanged in position. No pneumothorax is noted. Stable right lung opacity is noted concerning for pneumonia or edema with associated pleural effusion. Stable reticular densities are noted throughout left lung concerning for edema or inflammation. IMPRESSION: Stable support apparatus. Stable bilateral lung densities are noted with probable right pleural effusion. Electronically Signed   By: Marijo Conception, M.D.   On: 06/09/2017 07:18     STUDIES:  1/06 Echo > LVEF 55-60%, RVSP 51 mmHg 1/08 CT chest > dense consolidation RML and RLL, small effusions, emphysema 1/09  bronchoscopy > airways within normal limits, no mass; bloody secretions noted 1/05 CT head > Negative 1/13 bedside ultrasound > small right effusion 1/13 FOB > bloody secretions in R lung, particularly RML / RLL but no mucus plugging / occlusion   CULTURES: Blood cultures 1/5 > Streptococcus pneumoniae Resp culture 1/9 > GPC, rare candida Bronch wash culture 1/13 >  Blood culture 1/13 >   ANTIBIOTICS: 8 azithromycin 1/5 > 1/9 Rocephin 1/5 > 1/13 Vanc 1/13 >  Mero 1/13 >   SIGNIFICANT EVENTS: 1/05  Admit with strep pneumo CAP  1/13 More episodes of hypoxemia, FOB, started on vasopressors  LINES/TUBES: OETT 1/8 >> RUE PICC 1/10 >>  DISCUSSION: 84 y/o male with prior lung cancer here with severe community acquired pneumonia from strep pneumo with associated bacteremia.  As of 1/13 has low grade fever, worsening episodes of hypoxemia.     ASSESSMENT / PLAN:  PULMONARY: A: Acute respiratory failure with hypoxemia > worsening 1/13 mucus plugging? Worsening pulm edema? Severe community acquired pneumonia from strep pneumo Likely acute pulmonary edema R pleural effusion  Repeat bronchoscopy 1/13> some bloody secretions, no mucus plugging P: PRVC, change to ARDS setting, 6 cc/kg Wean PEEP / FiO2 for sats > 90% Will be less aggressive with WUA until he is stabilizing Follow CXR  Consider recheck R pleural space for increasing effusion.  VAP prevention measures  Follow cultures  Xopenex TID + PRN   CARDIOVASCULAR: A:  Multifocal atrial tachycardia - resolved 1/14 Circulatory shock 1/13, presumably sedation related as doesn't appear septic and physical exam consistent with volume overload P: Continue phenylephrine and titrate  Continue lipitor  Hydralazine available prn, has not been needed.  ASA 81 mg QD  RENAL: A: Hypokalemia New hyperkalemia P: Trend BMP / urinary output Free water 200 ml Q4  Replace electrolytes as indicated, Avoid nephrotoxic agents, ensure  adequate renal perfusion  GASTROINTESTINAL: A: Nutrition needs P: Continue TF  Protonix for SUP   ENDOCRINE: A: Hyperglycemia P: SSI Q4   ID: A:  Strep Pneumoniae CAP with Bacteremia Fever 1/13 - still could be due to strep but will cover for HCAP given worsening hypoxemia P: Continue broad spectrum abx Follow cx, new data from 1/13  NEURO: A: Sedation needs for vent synchrony P: RASS goal: -2 to -3 Fentanyl gtt Change precedex to versed gtt  Family:  No family at bedside 1/15 am   Independent CC time 36 minutes.   Baltazar Apo, MD, PhD 06/09/2017, 10:56 AM [facility] [phone] or if no answer [phone]

## 2017-06-09 NOTE — Progress Notes (Signed)
RT did recruitment maneuver (per order) due to patients sats being 82%. RT did bag patient for approx. 3-4 minutes and sats only went up to 88%. RT will continue to monitor

## 2017-06-10 ENCOUNTER — Inpatient Hospital Stay (HOSPITAL_COMMUNITY): Payer: Medicare Other

## 2017-06-10 LAB — BASIC METABOLIC PANEL
ANION GAP: 4 — AB (ref 5–15)
BUN: 24 mg/dL — AB (ref 6–20)
CO2: 32 mmol/L (ref 22–32)
Calcium: 7.7 mg/dL — ABNORMAL LOW (ref 8.9–10.3)
Chloride: 99 mmol/L — ABNORMAL LOW (ref 101–111)
Creatinine, Ser: 0.62 mg/dL (ref 0.61–1.24)
GFR calc Af Amer: >60 mL/min (ref >60)
Glucose, Bld: 108 mg/dL — ABNORMAL HIGH (ref 65–99)
POTASSIUM: 5 mmol/L (ref 3.5–5.1)
SODIUM: 135 mmol/L (ref 135–145)

## 2017-06-10 LAB — CBC
HCT: 29.7 % — ABNORMAL LOW (ref 39.0–52.0)
HEMOGLOBIN: 9.5 g/dL — AB (ref 13.0–17.0)
MCH: 32 pg (ref 26.0–34.0)
MCHC: 32 g/dL (ref 30.0–36.0)
MCV: 100 fL (ref 78.0–100.0)
PLATELETS: 242 10*3/uL (ref 150–400)
RBC: 2.97 MIL/uL — AB (ref 4.22–5.81)
RDW: 14.7 % (ref 11.5–15.5)
WBC: 8.7 10*3/uL (ref 4.0–10.5)

## 2017-06-10 LAB — GLUCOSE, CAPILLARY
GLUCOSE-CAPILLARY: 111 mg/dL — AB (ref 65–99)
GLUCOSE-CAPILLARY: 98 mg/dL (ref 65–99)
Glucose-Capillary: 130 mg/dL — ABNORMAL HIGH (ref 65–99)
Glucose-Capillary: 65 mg/dL (ref 65–99)
Glucose-Capillary: 100 mg/dL — ABNORMAL HIGH (ref 65–99)

## 2017-06-10 LAB — PHOSPHORUS: Phosphorus: 2.5 mg/dL (ref 2.5–4.6)

## 2017-06-10 LAB — CULTURE, RESPIRATORY: CULTURE: NORMAL

## 2017-06-10 LAB — MAGNESIUM: MAGNESIUM: 1.9 mg/dL (ref 1.7–2.4)

## 2017-06-10 LAB — CULTURE, RESPIRATORY W GRAM STAIN

## 2017-06-10 NOTE — Progress Notes (Signed)
Pharmacy Antibiotic Note  Patrick Carr is a 84 y.o. male admitted on 06/04/2017  history of limited stage small cell lung cancer treated in 2015 is here now with acute respiratory failure with hypoxemia due to severe CAP.  Pt has been on ceftriaxone for strep pneumo bacteremia, but pharmacy has been consulted for vancomycin and merrem dosing for HCAP.  Today, 06/10/2017: Tm 101.8 WBC 8.7 SCr 0.62, stable  Plan: Vancomycin d/c today by PCCM Continue meropenem 1gm IV q8h Follow renal function, cultures and clinical course  Height: _0  (165.1 cm) Weight: 142 lb 13.7 oz (64.8 kg) IBW/kg (Calculated) : 61.5  Temp (24hrs), Avg:100.1 F (37.8 C), Min:97.9 F (36.6 C), Max:101.8 F (38.8 C)  Recent Labs  Lab 06/06/17 0500 06/06/17 1832 06/07/17 0500 06/08/17 0450 06/09/17 0328 06/10/17 0720  WBC 7.4 8.7 13.9* 12.1* 9.5 8.7  CREATININE 0.59*  --  0.65 0.53* 0.61 0.62    Estimated Creatinine Clearance: 63 mL/min (by C-G formula based on SCr of 0.62 mg/dL).    Allergies  Allergen Reactions  . Contrast Media [Iodinated Diagnostic Agents]   . Other Other (See Comments)    Pneumonia vaccine causes arm swelling, caused redness  . Pneumococcal Vaccines     Arm swelling    Antimicrobials this admission: 1/6 ceftriaxone >> 1/13 1/6 azith >> 1/8 1/13 vanc >>1/16 1/13 merrem >>  Dose adjustments this admission:   Microbiology results: 1/5 BCx: S.Pneumoniae (pan susc) 1/5 Respiratory panel: none detected 1/6 MRSC PCR: negative 1/9 resp cx (BAL): rare candida tropicalis 1/13 Bronch wash: normal flora 1/13 BCx (pediatric bottles): ngtd  Thank you for allowing pharmacy to be a part of this patient's care.  Gretta Arab PharmD, BCPS Pager [phone] 06/10/2017 1:26 PM

## 2017-06-10 NOTE — Progress Notes (Addendum)
PULMONARY / CRITICAL CARE MEDICINE   Name: Patrick Carr MRN: [id number] DOB: November 13, 1933    ADMISSION DATE:  06/07/2017 CONSULTATION DATE: 1/8  REFERRING MD:  Denton Brick  CHIEF COMPLAINT:  Delirium and hypoxia   BRIEF: 84 y/o male with a history of limited stage small cell lung cancer treated in 2015 is here now with acute respiratory failure with hypoxemia due to severe CAP. At baseline he remains active and lives independently.     SUBJECTIVE:   Tolerated decrease FiO2 to 0.60, PEEP remains 12 Less agitation and desat with addition of versed.  precedex is still running   VITAL SIGNS: BP (!) 130/50   Pulse 85   Temp 98.8 F (37.1 C)   Resp (!) 26   Ht 5\' 5"  (1.651 m)   Wt 64.8 kg (142 lb 13.7 oz)   SpO2 98%   BMI 23.77 kg/m   HEMODYNAMICS: CVP:  [11 mmHg-13 mmHg] 11 mmHg  VENTILATOR SETTINGS: Vent Mode: PRVC FiO2 (%):  [60 %-100 %] 60 % Set Rate:  [22 bmp-26 bmp] 26 bmp Vt Set:  [370 mL-380 mL] 380 mL PEEP:  [10 cmH20-14 cmH20] 10 cmH20 Plateau Pressure:  [19 cmH20-33 cmH20] 19 cmH20  INTAKE / OUTPUT:  Intake/Output Summary (Last 24 hours) at 06/10/2017 1230 Last data filed at 06/10/2017 1118 Gross per 24 hour  Intake 4874.47 ml  Output 1985 ml  Net 2889.47 ml     PHYSICAL EXAMINATION: General: ill appearing man, sedated and ventilated HEENT: MM pink/moist, ETT, arcus senilis  Neuro: sedated, some grimace to voice, moves UE's CV: regular, no M PULM: mostly clear insp with B exp squeak XQ:JJHE, non-distended  Extremities: warm/dry, some RUE edema, PICC in place Skin: no rashes or lesions  LABS:  BMET Recent Labs  Lab 06/08/17 0450 06/09/17 0328 06/10/17 0720  NA 142 140 135  K 3.1* 5.1 5.0  CL 108 100* 99*  CO2 30 35* 32  BUN 23* 25* 24*  CREATININE 0.53* 0.61 0.62  GLUCOSE 127* 128* 108*    Electrolytes Recent Labs  Lab 06/04/17 1658  06/07/17 0500 06/08/17 0450 06/09/17 0328 06/10/17 0720  CALCIUM 8.4*   < > 7.6* 5.9* 7.8* 7.7*   MG 1.9   < > 2.5*  --  2.1 1.9  PHOS 2.2*  --   --   --  2.3* 2.5   < > = values in this interval not displayed.    CBC Recent Labs  Lab 06/08/17 0450 06/09/17 0328 06/10/17 0720  WBC 12.1* 9.5 8.7  HGB 10.1* 9.7* 9.5*  HCT 32.6* 30.3* 29.7*  PLT 224 217 242    Coag's No results for input(s): APTT, INR in the last 168 hours.  Sepsis Markers No results for input(s): LATICACIDVEN, PROCALCITON, O2SATVEN in the last 168 hours.  ABG Recent Labs  Lab 06/05/17 0435 06/05/17 1100 06/09/17 1447  PHART 7.413 7.465* 7.329*  PCO2ART 46.4 42.8 66.2*  PO2ART 59.9* 66.7* 85.4    Liver Enzymes Recent Labs  Lab 06/04/17 0508 06/05/17 0358 06/06/17 0500  AST 26 26 27   ALT 20 22 24   ALKPHOS 60 71 58  BILITOT 0.8 0.7 0.5  ALBUMIN 2.1* 2.3* 1.9*    Cardiac Enzymes No results for input(s): TROPONINI, PROBNP in the last 168 hours.  Glucose Recent Labs  Lab 06/09/17 1117 06/09/17 1523 06/09/17 1916 06/09/17 2309 06/10/17 0316 06/10/17 0714  GLUCAP 143* 80 120* 119* 111* 98    Imaging Dg Chest Port 1 [address]  Result Date: 06/10/2017 CLINICAL DATA:  Respiratory failure.  Endotracheal tube position. EXAM: PORTABLE CHEST 1 VIEW COMPARISON:  One-view chest x-ray 06/09/2017 FINDINGS: Endotracheal tube is stable, 5 cm above the carina. A right-sided PICC line is in place. The NG tube courses off the inferior border of the film. The heart size normal. Aortic atherosclerosis is present. Asymmetric right-sided airspace disease and effusion is stable. IMPRESSION: 1. No significant change and right lower lobe pneumonia and effusion. 2. Asymmetric right-sided edema or airspace disease. 3. Support apparatus is stable. 4.  Aortic Atherosclerosis (ICD10-I70.0). Electronically Signed   By: San Morelle M.D.   On: 06/10/2017 08:09     STUDIES:  1/06 Echo > LVEF 55-60%, RVSP 51 mmHg 1/08 CT chest > dense consolidation RML and RLL, small effusions, emphysema 1/09 bronchoscopy >  airways within normal limits, no mass; bloody secretions noted 1/05 CT head > Negative 1/13 bedside ultrasound > small right effusion 1/13 FOB > bloody secretions in R lung, particularly RML / RLL but no mucus plugging / occlusion   CULTURES: Blood cultures 1/5 > Streptococcus pneumoniae Resp culture 1/9 > GPC, rare candida Bronch wash culture 1/13 > normal flora Blood culture 1/13 >   ANTIBIOTICS: 8 azithromycin 1/5 > 1/9 Rocephin 1/5 > 1/13 Vanc 1/13 > 1/16 Mero 1/13 >   SIGNIFICANT EVENTS: 1/05  Admit with strep pneumo CAP  1/13 More episodes of hypoxemia, FOB, started on vasopressors  LINES/TUBES: OETT 1/8 >> RUE PICC 1/10 >>  DISCUSSION: 84 y/o male with prior lung cancer here with severe community acquired pneumonia from strep pneumo with associated bacteremia.  As of 1/13 has low grade fever, worsening episodes of hypoxemia.     ASSESSMENT / PLAN:  PULMONARY: A: Acute respiratory failure with hypoxemia > worsening 1/13 mucus plugging? Worsening pulm edema? Severe community acquired pneumonia from strep pneumo Likely acute pulmonary edema R pleural effusion  Repeat bronchoscopy 1/13> some bloody secretions, no mucus plugging P: PRVC at 6cc/kg Attempt to reduce FiO2 further, once at 0.40 then work to decrease PEEP Continue sedation as below Will be less aggressive with WUA until he is stabilizing Follow CXR  Follow for evolving R effusion xopenex prn  CARDIOVASCULAR: A:  Multifocal atrial tachycardia - resolved 1/14 Circulatory shock 1/13, presumably sedation related as doesn't appear septic and physical exam consistent with volume overload P: Wean phenylephrine as able Continue lipitor  Hydralazine available prn, has not been needed.  ASA 81 mg QD  RENAL: A: Hypokalemia New hyperkalemia P: Follow BMP and UOP Free water 200 ml Q4  Replace electrolytes as indicated, Avoid nephrotoxic agents, ensure adequate renal  perfusion  GASTROINTESTINAL: A: Nutrition needs P: Continue TF, tolerating well Protonix for SUP   ENDOCRINE: A: Hyperglycemia P: Insulin per SSI protocol  ID: A:  Strep Pneumoniae CAP with Bacteremia Fever 1/13 - still could be due to strep but will cover for HCAP given worsening hypoxemia P: Stop vanco 1/16, continue meropenem Narrow based on cx data from 1/13  NEURO: A: Sedation needs for vent synchrony P: RASS goal: -2 to -3 Fentanyl gtt Continue versed precedex to off  Family:  Discussed case with his wife and daughter at bedside 1/16   Independent CC time 35 minutes.   Baltazar Apo, MD, PhD 06/10/2017, 12:30 PM [facility] [phone] or if no answer [phone]

## 2017-06-10 NOTE — Progress Notes (Signed)
Recruitment x 2 performed. Settings increased to 100% and +14 to maintain O2 sats of 91-92%. Will wean as tolerated by patient.

## 2017-06-11 ENCOUNTER — Inpatient Hospital Stay (HOSPITAL_COMMUNITY): Payer: Medicare Other

## 2017-06-11 LAB — GLUCOSE, CAPILLARY
GLUCOSE-CAPILLARY: 86 mg/dL (ref 65–99)
GLUCOSE-CAPILLARY: 104 mg/dL — AB (ref 65–99)
GLUCOSE-CAPILLARY: 78 mg/dL (ref 65–99)
GLUCOSE-CAPILLARY: 70 mg/dL (ref 65–99)
Glucose-Capillary: 114 mg/dL — ABNORMAL HIGH (ref 65–99)
Glucose-Capillary: 124 mg/dL — ABNORMAL HIGH (ref 65–99)
Glucose-Capillary: 35 mg/dL — CL (ref 65–99)
Glucose-Capillary: 78 mg/dL (ref 65–99)
Glucose-Capillary: 87 mg/dL (ref 65–99)

## 2017-06-11 MED ORDER — PHENYLEPHRINE HCL 10 MG/ML IJ SOLN
0.0000 ug/min | INTRAMUSCULAR | Status: DC
  Administered 2017-06-11 – 2017-06-12 (×4): 80 ug/min via INTRAVENOUS
  Administered 2017-06-13 (×2): 90 ug/min via INTRAVENOUS
  Administered 2017-06-14: 100 ug/min via INTRAVENOUS
  Filled 2017-06-11 (×2): qty 4
  Filled 2017-06-11 (×3): qty 40
  Filled 2017-06-11 (×2): qty 4
  Filled 2017-06-11 (×2): qty 40

## 2017-06-11 MED ORDER — DEXTROSE 50 % IV SOLN
INTRAVENOUS | Status: AC
  Administered 2017-06-11: 50 mL
  Filled 2017-06-11: qty 50

## 2017-06-11 MED ORDER — DEXTROSE-NACL 5-0.45 % IV SOLN
INTRAVENOUS | Status: DC
  Administered 2017-06-11: 15:00:00 via INTRAVENOUS

## 2017-06-11 NOTE — Progress Notes (Signed)
CBG 38. Dr. Lamonte Sakai notified. Was told to check blood via PICC line. Glucose 87. No further orders. Will continue to monitor,

## 2017-06-11 NOTE — Progress Notes (Signed)
Recruitment performed at 1149. RT will continue to monitor

## 2017-06-11 NOTE — Progress Notes (Signed)
Pt desated to 84% RT performed recruitment and his SATS increased to 95% and RT increased his FIO2 by 5. The Pt SATS are 91% on 85%

## 2017-06-11 NOTE — Progress Notes (Signed)
pts cbg 2000 124.

## 2017-06-11 NOTE — Progress Notes (Signed)
Date: June 11, 2017 Velva Harman, BSN, [place], [place] Chart and notes review for patient progress and needs.Remains on vent support, iv precedex and iv neo for pressure support. Will follow for case management and discharge needs.  None at this time.  Patient is critically ill. No cm or discharge needs present at time of this review. Next review date: [id number]

## 2017-06-11 NOTE — Progress Notes (Signed)
Inpatient Diabetes Program Recommendations  AACE/ADA: New Consensus Statement on Inpatient Glycemic Control (2015)  Target Ranges:  Prepandial:   less than 140 mg/dL      Peak postprandial:   less than 180 mg/dL (1-2 hours)      Critically ill patients:  140 - 180 mg/dL   Results for Patrick Carr, Patrick Carr (MRN [id number]) as of 06/11/2017 10:47  Ref. Range 06/09/2017 23:09 06/10/2017 03:16 06/10/2017 07:14 06/10/2017 12:41 06/10/2017 20:05  Glucose-Capillary Latest Ref Range: 65 - 99 mg/dL 119 (H) 111 (H) 98 130 (H)  3 units Novolog 65   Results for Patrick Carr, Patrick Carr (MRN [id number]) as of 06/11/2017 10:47  Ref. Range 06/11/2017 00:16 06/11/2017 03:44 06/11/2017 07:27  Glucose-Capillary Latest Ref Range: 65 - 99 mg/dL 78 114 (H) 35 (LL)   Admit with: Sepsis  NO History of DM noted  Current Insulin Orders: Novolog Moderate Correction Scale/ SSI (0-15 units) Q4 hours      MD- Note patient with Hypoglycemia last night at 8pm and again today at 8am.  Pt only received one dose of Novolog SSI yesterday (3 units at 12pm).  Please consider d/c of Novolog SSI for now.  May want to continue to check CBGs Q4 hours.     --Will follow patient during hospitalization--  Wyn Quaker RN, MSN, CDE Diabetes Coordinator Inpatient Glycemic Control Team Team Pager: [phone] (8a-5p)

## 2017-06-11 NOTE — Progress Notes (Addendum)
Hypoglycemic Event  CBG: 35   Treatment: D50 IV 50 mL  Symptoms: None and patient on vent unable to assess  Follow-up CBG: Time: 0759 CBG Result: 104  Possible Reasons for Event: Unknown  Comments/MD notified: MD Dimple Nanas paged no answer Spoke with and updated  Dr. Lamonte Sakai on the unit. Will continue to monitor.    Melora Menon Liz Malady

## 2017-06-11 NOTE — Progress Notes (Signed)
PULMONARY / CRITICAL CARE MEDICINE   Name: Patrick Carr MRN: [id number] DOB: 1933/12/30    ADMISSION DATE:  06/22/2017 CONSULTATION DATE: 1/8  REFERRING MD:  Denton Brick  CHIEF COMPLAINT:  Delirium and hypoxia   BRIEF: 84 y/o male with a history of limited stage small cell lung cancer treated in 2015 is here now with acute respiratory failure with hypoxemia due to severe CAP. At baseline he remains active and lives independently.     SUBJECTIVE:   Patrick Carr has continued to have periodic episodes of desaturation that required increases in his PEEP and FiO2.  Currently on FiO2 80, PEEP 14 after recruitment maneuver yesterday afternoon. Chest x-ray shows significant right lower lobe consolidation with some more hazy left-sided infiltrate, question size of R effusion  VITAL SIGNS: BP (!) 108/44   Pulse 100   Temp (!) 100.6 F (38.1 C)   Resp (!) 26   Ht 5\' 5"  (1.651 m)   Wt 64.5 kg (142 lb 3.2 oz)   SpO2 98%   BMI 23.66 kg/m   HEMODYNAMICS: CVP:  [9 mmHg-14 mmHg] 12 mmHg  VENTILATOR SETTINGS: Vent Mode: PRVC FiO2 (%):  [60 %-100 %] 90 % Set Rate:  [26 bmp] 26 bmp Vt Set:  [380 mL] 380 mL PEEP:  [10 cmH20-14 cmH20] 14 cmH20 Plateau Pressure:  [19 cmH20-28 cmH20] 28 cmH20  INTAKE / OUTPUT:  Intake/Output Summary (Last 24 hours) at 06/11/2017 1117 Last data filed at 06/11/2017 0800 Gross per 24 hour  Intake 3428.78 ml  Output 1055 ml  Net 2373.78 ml     PHYSICAL EXAMINATION: General: Ill-appearing man, sedated, ventilated HEENT: ET tube in place, oropharynx clear, arcus senilis present Neuro: Deeply sedated, some grimace with suctioning, does not respond to voice CV: Regular, no murmur PULM: Bilateral expiratory squeaks present, no crackles, decreased right base GI: Soft, nondistended, positive bowel sounds Extremities: warm/dry, some RUE edema, PICC in place Skin: no rashes or lesions  LABS:  BMET Recent Labs  Lab 06/08/17 0450 06/09/17 0328 06/10/17 0720   NA 142 140 135  K 3.1* 5.1 5.0  CL 108 100* 99*  CO2 30 35* 32  BUN 23* 25* 24*  CREATININE 0.53* 0.61 0.62  GLUCOSE 127* 128* 108*    Electrolytes Recent Labs  Lab 06/04/17 1658  06/07/17 0500 06/08/17 0450 06/09/17 0328 06/10/17 0720  CALCIUM 8.4*   < > 7.6* 5.9* 7.8* 7.7*  MG 1.9   < > 2.5*  --  2.1 1.9  PHOS 2.2*  --   --   --  2.3* 2.5   < > = values in this interval not displayed.    CBC Recent Labs  Lab 06/08/17 0450 06/09/17 0328 06/10/17 0720  WBC 12.1* 9.5 8.7  HGB 10.1* 9.7* 9.5*  HCT 32.6* 30.3* 29.7*  PLT 224 217 242    Coag's No results for input(s): APTT, INR in the last 168 hours.  Sepsis Markers No results for input(s): LATICACIDVEN, PROCALCITON, O2SATVEN in the last 168 hours.  ABG Recent Labs  Lab 06/05/17 0435 06/05/17 1100 06/09/17 1447  PHART 7.413 7.465* 7.329*  PCO2ART 46.4 42.8 66.2*  PO2ART 59.9* 66.7* 85.4    Liver Enzymes Recent Labs  Lab 06/05/17 0358 06/06/17 0500  AST 26 27  ALT 22 24  ALKPHOS 71 58  BILITOT 0.7 0.5  ALBUMIN 2.3* 1.9*    Cardiac Enzymes No results for input(s): TROPONINI, PROBNP in the last 168 hours.  Glucose Recent Labs  Lab 06/10/17 2118  06/11/17 0016 06/11/17 0344 06/11/17 0727 06/11/17 0759 06/11/17 1113  GLUCAP 100* 78 114* 35* 104* 38*    Imaging No results found.   STUDIES:  1/06 Echo > LVEF 55-60%, RVSP 51 mmHg 1/08 CT chest > dense consolidation RML and RLL, small effusions, emphysema 1/09 bronchoscopy > airways within normal limits, no mass; bloody secretions noted 1/05 CT head > Negative 1/13 bedside ultrasound > small right effusion 1/13 FOB > bloody secretions in R lung, particularly RML / RLL but no mucus plugging / occlusion   CULTURES: Blood cultures 1/5 > Streptococcus pneumoniae Resp culture 1/9 > GPC, rare candida Bronch wash culture 1/13 > normal flora Blood culture 1/13 >   ANTIBIOTICS: azithromycin 1/5 > 1/9 Rocephin 1/5 > 1/13 Vanc 1/13 >  1/16 Mero 1/13 >   SIGNIFICANT EVENTS: 1/05  Admit with strep pneumo CAP  1/13 More episodes of hypoxemia, FOB, started on vasopressors  LINES/TUBES: OETT 1/8 >> RUE PICC 1/10 >>  DISCUSSION: 84 y/o male with prior lung cancer here with severe community acquired pneumonia from strep pneumo with associated bacteremia.  As of 1/13 has low grade fever, worsening episodes of hypoxemia that have required increase fiO2 and PEEP   ASSESSMENT / PLAN:  PULMONARY: A: Acute respiratory failure with hypoxemia > worsening 1/13 mucus plugging? Worsening pulm edema? Severe community acquired pneumonia from strep pneumo Likely acute pulmonary edema R pleural effusion  Repeat bronchoscopy 1/13> some bloody secretions, no mucus plugging P: Continue PRVC at 6 cc/kg We are continuing somewhat deeper sedation deferring aggressive wakeup assessment given his PEEP and oxygen needs Continue to try to wean PEEP and FiO2 as we are able Consider paralytics or proning if no progress Chest x-ray today pending Consider repeat ultrasound right hemithorax to assess degree of effusion, consider thoracentesis versus chest tube.   Repeat culture data 1/13 unremarkable, continue same antibiotics as below  CARDIOVASCULAR: A:  Multifocal atrial tachycardia - resolved 1/14 Circulatory shock 1/13, presumably sedation related as doesn't appear septic and physical exam consistent with volume overload P: Wean phenylephrine as able, goal SBP greater than 90 Continue lipitor  Hydralazine available prn, has not been needed.  Continue ASA 81 mg QD  RENAL: A: Hypokalemia New hyperkalemia P: Follow BMP, urine output Continue Free water 200 ml Q4  Replace electrolytes as indicated Avoid nephrotoxic agents and ensure adequate renal perfusion  GASTROINTESTINAL: A: Nutrition needs P: Continue TF, tolerating well 55 cc/h Protonix for SUP  ENDOCRINE: A: Hyperglycemia P: SSI protocol  ID: A:  Strep  Pneumoniae CAP with Bacteremia Fever 1/13 - still could be due to strep but will cover for HCAP given worsening hypoxemia P: Stopped vanco 1/16, continue meropenem Narrowed based on cx data from 1/13  NEURO: A: Sedation needs for vent synchrony P: RASS goal: -2 to -3 Fentanyl gtt Continue versed precedex to off  Family:  Discussed case with his wife and daughter at bedside 1/16   Independent CC time 33 minutes.   Baltazar Apo, MD, PhD 06/11/2017, 11:17 AM [facility] [phone] or if no answer [phone]

## 2017-06-11 NOTE — Progress Notes (Signed)
Nutrition Follow-up  DOCUMENTATION CODES:   Non-severe (moderate) malnutrition in context of chronic illness  INTERVENTION:  - Continue current TF regimen.  - Will monitor weight trends closely.   NUTRITION DIAGNOSIS:   Moderate Malnutrition related to chronic illness, cancer and cancer related treatments as evidenced by energy intake < or equal to 50% for > or equal to 1 month, mild fat depletion, moderate muscle depletion, severe muscle depletion. -ongoing  GOAL:   Patient will meet greater than or equal to 90% of their needs -met with TF regimen  MONITOR:   Vent status, TF tolerance, Weight trends, Labs  ASSESSMENT:   Pt with PMH significant for BPH, right small cell lung cancer s/p chemotherapy (04/2014), HLD, HTN. Presents to ED with complaints of 3-4 weeks history of cough, SOB, and malaise. Admitted for sepsis secondary to pulmonary source. Pt transferred to SDU with new onset a-fib requiring Cardizem gtt, intubated due to impending respiratory failure 1/8.   1/17 Pt remains intubated with OGT in place. He is receiving Osmolite 1.2 @ 55 mL/hr with 200 mL free water every 4 hours. This regimen is providing 1584 kcal (94% re-estimated kcal need), 73 grams of protein, and 2282 mL free water. Noted weight +12 lbs/5.1 kg compared to weight on 1/14, so continue to use weight from that date (59.4 kg) to re-estimate needs. Reviewed Dr. Agustina Caroli note from yesterday.   Patient is currently intubated on ventilator support MV: 9.4 L/min Temp (24hrs), Avg:100.2 F (37.9 C), Min:98.2 F (36.8 C), Max:101.1 F (38.4 C) BP: 104/44 and MAP: 63  Medications reviewed; sliding scale Novolog, 40 mg IV Protonix/day, 1 tablet Senokot BID.  Labs reviewed; CBGs: 35-114 mg/dL, Cl: 99 mmol/L, BUN: 24 mg/dL, Ca: 7.7 mg/dL.  IVF: LR @ 10 mL/hr.  Drips: Fentanyl @ 175 mcg/hr, Neo @ 80 mcg/min, Versed @ 6 mg/hr.     1/14 - Receiving Osmolite 1.2 @ 40 mL/hr with 30 mL Prostat TID and 200 mL free  water every 4 hours.  - This regimen is providing 1452 kcal (72% re-estimated kcal need), 98 grams of protein, and 1987 mL free water.  - Estimated nutrition needs updated and based on current weight (59.4 kg).  - Weight +17 lbs/7.7 kg from 1/6-1/10 and then -4 lbs/1.8 kg from 1/10-this AM. - Weight from 1/10-today is consistent with previous UBW, prior to cancer dx.  - Free water flush ordered by CCM team on 1/10.  - Per PCCM NP note this AM, pt had bloody secretions in R lung yesterday without mucus plugging present during repeat bronch.  - He may need to transition to ARDS vent settings/ARDS protocol. Goal MAp: >65.   Patient is currently intubated on ventilator support MV: 7.7 L/min Temp (24hrs), Avg:99.7 F (37.6 C), Min:98.8 F (37.1 C), Max:100.8 F (38.2 C) BP: 172/74 and MAP: 108  Medication; 40 mEq KCl per OGT x2 doses today Lab; K: 3.1 mmol/L IVF: LR @ 10 mL/hr.  Drips: Fentanyl @ 175 mcg/hr, Neo @ 30 mcg/min, and Precedex @ 0.7 mcg/kg/hr.     Diet Order:  Diet NPO time specified  EDUCATION NEEDS:   Not appropriate for education at this time  Skin:  Skin Assessment: Reviewed RN Assessment  Last BM:  1/17  Height:   Ht Readings from Last 1 Encounters:  06/09/17 5' 5" (1.651 m)    Weight:   Wt Readings from Last 1 Encounters:  06/11/17 142 lb 3.2 oz (64.5 kg)    Ideal Body Weight:  61.8 kg  BMI:  Body mass index is 23.66 kg/m.  Estimated Nutritional Needs:   Kcal:  3903  Protein:  71-89 grams (1.2-1.5 grams/kg)  Fluid:  >1.4 L/day     Patrick Matin, MS, RD, LDN, [facility] Inpatient Clinical Dietitian Pager # [phone] After hours/weekend pager # [phone]

## 2017-06-12 ENCOUNTER — Inpatient Hospital Stay (HOSPITAL_COMMUNITY): Payer: Medicare Other

## 2017-06-12 DIAGNOSIS — J90 Pleural effusion, not elsewhere classified: Secondary | ICD-10-CM

## 2017-06-12 DIAGNOSIS — J9600 Acute respiratory failure, unspecified whether with hypoxia or hypercapnia: Secondary | ICD-10-CM

## 2017-06-12 LAB — BASIC METABOLIC PANEL
ANION GAP: 2 — AB (ref 5–15)
ANION GAP: 3 — AB (ref 5–15)
BUN: 25 mg/dL — ABNORMAL HIGH (ref 6–20)
BUN: 30 mg/dL — ABNORMAL HIGH (ref 6–20)
CALCIUM: 5.9 mg/dL — AB (ref 8.9–10.3)
CALCIUM: 7.3 mg/dL — AB (ref 8.9–10.3)
CHLORIDE: 106 mmol/L (ref 101–111)
CHLORIDE: 103 mmol/L (ref 101–111)
CO2: 26 mmol/L (ref 22–32)
CO2: 32 mmol/L (ref 22–32)
CREATININE: 0.69 mg/dL (ref 0.61–1.24)
Creatinine, Ser: 0.57 mg/dL — ABNORMAL LOW (ref 0.61–1.24)
GFR calc Af Amer: >60 mL/min (ref >60)
GFR calc non Af Amer: >60 mL/min (ref >60)
GFR calc non Af Amer: >60 mL/min (ref >60)
GLUCOSE: 158 mg/dL — AB (ref 65–99)
Glucose, Bld: 350 mg/dL — ABNORMAL HIGH (ref 65–99)
Potassium: 4.2 mmol/L (ref 3.5–5.1)
Potassium: 5 mmol/L (ref 3.5–5.1)
Sodium: 134 mmol/L — ABNORMAL LOW (ref 135–145)
Sodium: 138 mmol/L (ref 135–145)

## 2017-06-12 LAB — MAGNESIUM
Magnesium: 1.6 mg/dL — ABNORMAL LOW (ref 1.7–2.4)
Magnesium: 1.9 mg/dL (ref 1.7–2.4)

## 2017-06-12 LAB — BODY FLUID CELL COUNT WITH DIFFERENTIAL
LYMPHS FL: 26 %
MONOCYTE-MACROPHAGE-SEROUS FLUID: 6 % — AB (ref 50–90)
NEUTROPHIL FLUID: 68 % — AB (ref 0–25)
WBC FLUID: 216 uL (ref 0–1000)

## 2017-06-12 LAB — GLUCOSE, CAPILLARY
GLUCOSE-CAPILLARY: 105 mg/dL — AB (ref 65–99)
GLUCOSE-CAPILLARY: 38 mg/dL — AB (ref 65–99)
GLUCOSE-CAPILLARY: 52 mg/dL — AB (ref 65–99)
GLUCOSE-CAPILLARY: 80 mg/dL (ref 65–99)
GLUCOSE-CAPILLARY: 84 mg/dL (ref 65–99)
Glucose-Capillary: 82 mg/dL (ref 65–99)
Glucose-Capillary: 81 mg/dL (ref 65–99)
Glucose-Capillary: 66 mg/dL (ref 65–99)

## 2017-06-12 LAB — CBC
HCT: 25 % — ABNORMAL LOW (ref 39.0–52.0)
Hemoglobin: 7.8 g/dL — ABNORMAL LOW (ref 13.0–17.0)
MCH: 31.6 pg (ref 26.0–34.0)
MCHC: 31.2 g/dL (ref 30.0–36.0)
MCV: 101.2 fL — ABNORMAL HIGH (ref 78.0–100.0)
Platelets: 240 10*3/uL (ref 150–400)
RBC: 2.47 MIL/uL — ABNORMAL LOW (ref 4.22–5.81)
RDW: 15.3 % (ref 11.5–15.5)
WBC: 7 10*3/uL (ref 4.0–10.5)

## 2017-06-12 LAB — BLOOD GAS, ARTERIAL
Acid-Base Excess: 6.3 mmol/L — ABNORMAL HIGH (ref 0.0–2.0)
BICARBONATE: 31.5 mmol/L — AB (ref 20.0–28.0)
Drawn by: 308601
FIO2: 100
LHR: 26 {breaths}/min
O2 Saturation: 98.5 %
PATIENT TEMPERATURE: 38.5
PCO2 ART: 57.1 mmHg — AB (ref 32.0–48.0)
PEEP: 16 cmH2O
VT: 0.37 mL
pH, Arterial: 7.37 (ref 7.350–7.450)
pO2, Arterial: 118 mmHg — ABNORMAL HIGH (ref 83.0–108.0)

## 2017-06-12 LAB — CULTURE, BLOOD (ROUTINE X 2)
CULTURE: NO GROWTH
Culture: NO GROWTH
Special Requests: ADEQUATE
Special Requests: ADEQUATE

## 2017-06-12 MED ORDER — FAMOTIDINE 40 MG/5ML PO SUSR
20.0000 mg | Freq: Every day | ORAL | Status: DC
  Filled 2017-06-12: qty 2.5

## 2017-06-12 MED ORDER — LIDOCAINE HCL 1 % IJ SOLN
INTRAMUSCULAR | Status: AC
  Filled 2017-06-12: qty 20

## 2017-06-12 MED ORDER — BISACODYL 10 MG RE SUPP: 10.0000 mg | Freq: Every day | RECTAL | Status: DC | PRN

## 2017-06-12 MED ORDER — FUROSEMIDE 10 MG/ML IJ SOLN
40.0000 mg | Freq: Once | INTRAMUSCULAR | Status: AC
  Administered 2017-06-12: 40 mg via INTRAVENOUS
  Filled 2017-06-12: qty 4

## 2017-06-12 MED ORDER — RANITIDINE HCL 150 MG/10ML PO SYRP
300.0000 mg | ORAL_SOLUTION | Freq: Every day | ORAL | Status: DC
  Administered 2017-06-12 – 2017-06-14 (×3): 300 mg
  Filled 2017-06-12 (×3): qty 20

## 2017-06-12 NOTE — Progress Notes (Signed)
Phone consent obtained for chest tube placement. Daughter updated regarding plan of care as well.  She is willing to continue to support him in the hopes he improves but does not believe he would want tracheostomy or facility living.  We agreed that we would reassess early next week to see what progress he has made.  If he has not made progress, they would likely want to transition to comfort care.    Noe Gens, NP-C [facility] Pgr: [phone] or if no answer [phone] 06/12/2017, 3:21 PM

## 2017-06-12 NOTE — Procedures (Signed)
Chest Tube Insertion Procedure Note  Indications:  Clinically significant Effusion  Pre-operative Diagnosis: Effusion  Post-operative Diagnosis: Effusion  Procedure Details  Informed consent was obtained for the procedure, including sedation.  Risks of lung perforation, hemorrhage, arrhythmia, and adverse drug reaction were discussed.   After sterile skin prep, using standard technique, a 20 French tube was placed in the right lateral 5th rib space.  Findings: 1000 ml of serous fluid obtained  Estimated Blood Loss:  Minimal         Specimens:  Sent serosanguinous fluid              Complications:  None; patient tolerated the procedure well.         Condition: stable  Attending Attestation: I performed the procedure.   Baltazar Apo, MD, PhD 06/12/2017, 4:49 PM [facility] [phone] or if no answer [phone]

## 2017-06-12 NOTE — Progress Notes (Signed)
PULMONARY / CRITICAL CARE MEDICINE   Name: Patrick Carr MRN: [id number] DOB: 1934/05/03    ADMISSION DATE:  06/15/2017 CONSULTATION DATE: 1/8  REFERRING MD:  Denton Brick  CHIEF COMPLAINT:  Delirium and hypoxia   BRIEF: 84 y/o male with a history of limited stage small cell lung cancer treated in 2015 is here now with acute respiratory failure with hypoxemia due to severe CAP. At baseline he remains active and lives independently.     SUBJECTIVE:  RN reports last BM approximately 2 days ago.  Concerned for abdominal swelling.  Remains on 17mcg neo. Fent 175, versed at 7.   VITAL SIGNS: BP (!) 109/41   Pulse 95   Temp (!) 101.1 F (38.4 C)   Resp (!) 26   Ht 5\' 5"  (1.651 m)   Wt 152 lb 8.9 oz (69.2 kg)   SpO2 100%   BMI 25.39 kg/m   HEMODYNAMICS: CVP:  [8 mmHg-35 mmHg] 9 mmHg  VENTILATOR SETTINGS: Vent Mode: PRVC FiO2 (%):  [70 %-100 %] 100 % Set Rate:  [26 bmp] 26 bmp Vt Set:  [370 mL-380 mL] 370 mL PEEP:  [14 cmH20-16 cmH20] 16 cmH20 Plateau Pressure:  [25 cmH20-29 cmH20] 28 cmH20  INTAKE / OUTPUT:  Intake/Output Summary (Last 24 hours) at 06/12/2017 1049 Last data filed at 06/12/2017 0600 Gross per 24 hour  Intake 3723.07 ml  Output 1915 ml  Net 1808.07 ml     PHYSICAL EXAMINATION: General: ill appearing elderly male in NAD on vent HEENT: MM pink/moist, ETT Neuro: sedate CV: s1s2 rrr, no m/r/g PULM: even/non-labored, lungs bilaterally diminished lower  GI: soft, non-tender, bsx4 active, pitting edema  Extremities: warm/dry, 2+ generalized pitting edema  Skin: no rashes or lesions   LABS:  BMET Recent Labs  Lab 06/10/17 0720 06/12/17 0300 06/12/17 0422  NA 135 134* 138  K 5.0 4.2 5.0  CL 99* 106 103  CO2 32 26 32  BUN 24* 25* 30*  CREATININE 0.62 0.57* 0.69  GLUCOSE 108* 350* 158*    Electrolytes Recent Labs  Lab 06/09/17 0328 06/10/17 0720 06/12/17 0300 06/12/17 0422  CALCIUM 7.8* 7.7* 5.9* 7.3*  MG 2.1 1.9 1.6* 1.9  PHOS 2.3* 2.5   --   --     CBC Recent Labs  Lab 06/09/17 0328 06/10/17 0720 06/12/17 0430  WBC 9.5 8.7 7.0  HGB 9.7* 9.5* 7.8*  HCT 30.3* 29.7* 25.0*  PLT 217 242 240    Coag's No results for input(s): APTT, INR in the last 168 hours.  Sepsis Markers No results for input(s): LATICACIDVEN, PROCALCITON, O2SATVEN in the last 168 hours.  ABG Recent Labs  Lab 06/05/17 1100 06/09/17 1447 06/12/17 0345  PHART 7.465* 7.329* 7.370  PCO2ART 42.8 66.2* 57.1*  PO2ART 66.7* 85.4 118*    Liver Enzymes Recent Labs  Lab 06/06/17 0500  AST 27  ALT 24  ALKPHOS 58  BILITOT 0.5  ALBUMIN 1.9*    Cardiac Enzymes No results for input(s): TROPONINI, PROBNP in the last 168 hours.  Glucose Recent Labs  Lab 06/11/17 1119 06/11/17 1523 06/11/17 1906 06/11/17 2302 06/12/17 0034 06/12/17 0335  GLUCAP 87 78 124* 70 105* 82    Imaging Dg Chest Port 1 View  Result Date: 06/12/2017 CLINICAL DATA:  Hypoxia EXAM: PORTABLE CHEST 1 VIEW COMPARISON:  June 11, 2017 FINDINGS: Endotracheal tube tip is 5.2 cm above the carina. Nasogastric tube tip and side port below the diaphragm. Central catheter tip is in the superior vena cava  near the cavoatrial junction. No pneumothorax. There is moderate pleural effusion on the right with much smaller effusion on the left. There is airspace consolidation throughout much of the right mid and lower lung zones, stable. There is focal consolidation medial left base which appears slightly more prominent than on 1 day prior study. There is aortic atherosclerosis. No adenopathy. There is calcification in each carotid artery. There is degenerative change in each shoulder. IMPRESSION: Tube and catheter positions as described without pneumothorax. Bilateral pleural effusions, larger on the right than on the left. Areas of airspace consolidation throughout much of the right mid lower lung zone as well as in the medial left base. Increased opacity in the left base is slightly more  prominent than on prior study. Stable cardiac silhouette. There is aortic atherosclerosis. There is also calcification in both carotid arteries. Aortic Atherosclerosis (ICD10-I70.0). Electronically Signed   By: Lowella Grip III M.D.   On: 06/12/2017 07:19   Dg Chest Port 1 View  Result Date: 06/11/2017 CLINICAL DATA:  History of hypertension and lung cancer. Acute respiratory failure. EXAM: PORTABLE CHEST 1 VIEW COMPARISON:  06/10/2017 FINDINGS: Similar appearance to yesterday's study. Endotracheal tube tip 4 cm above the carina. Nasogastric tube enters the abdomen. Right arm PICC tip at the SVC RA junction. Moderate size right effusion with volume loss in the right lower lung. Interstitial density in both lungs, right more than left. IMPRESSION: No significant change since yesterday. Moderate size right effusion with right lower lung volume loss. Interstitial density bilaterally right more than left. Electronically Signed   By: Nelson Chimes M.D.   On: 06/11/2017 11:51     STUDIES:  1/06 Echo > LVEF 55-60%, RVSP 51 mmHg 1/08 CT chest > dense consolidation RML and RLL, small effusions, emphysema 1/09 bronchoscopy > airways within normal limits, no mass; bloody secretions noted 1/05 CT head > Negative 1/13 bedside ultrasound > small right effusion 1/13 FOB > bloody secretions in R lung, particularly RML / RLL but no mucus plugging / occlusion 1/18 Concern for volume overload, effusion on [place] assessment   CULTURES: Blood cultures 1/5 > Streptococcus pneumoniae Resp culture 1/9 > GPC, rare candida Bronch wash culture 1/13 > normal flora Blood culture 1/13 > negative  ANTIBIOTICS: azithromycin 1/5 > 1/9 Rocephin 1/5 > 1/13 Vanc 1/13 > 1/16 Mero 1/13 >   SIGNIFICANT EVENTS: 1/05  Admit with strep pneumo CAP  1/13  More episodes of hypoxemia, FOB, started on vasopressors 1/18  Ongoing episodes of desaturation / hypoxia  LINES/TUBES: OETT 1/8 >> RUE PICC 1/10 >>  DISCUSSION: 84 y/o  male with prior lung cancer here with severe community acquired pneumonia from strep pneumo with associated bacteremia.  Has has persistent low grade fevers, worsening episodes of hypoxemia that have required increase fiO2 and PEEP   ASSESSMENT / PLAN:  PULMONARY: A: Acute respiratory failure with hypoxemia > worsening 1/13 mucus plugging? Worsening pulm edema? Severe community acquired pneumonia from strep pneumo Likely acute pulmonary edema R pleural effusion  Repeat bronchoscopy 1/13> some bloody secretions, no mucus plugging P: PRVC 6 cc/kg  Wean PEEP / FiO2 per ARDS protocol  Continue sedation protocol  Lasix 40 mg IV x1 Bedside US assessment with large volume pleural fluid > may need CXR vs CT Follow intermittent CXR  See ID  Xopenex TID & PRN   CARDIOVASCULAR: A:  Multifocal atrial tachycardia - resolved 1/14 Circulatory shock 1/13, presumably sedation related as doesn't appear septic and physical exam consistent with volume  overload P: Wean neosynephrine for SBP >90 Continue lipitor PRN hydralazine  ASA 81 mg QD  No CPR in the event of arrest.   RENAL: A: Hypokalemia New hyperkalemia P: Free water 200 mg Q4 D51/2 NS at 20 ml/hr Trend BMP / urinary output Replace electrolytes as indicated Avoid nephrotoxic agents, ensure adequate renal perfusion  GASTROINTESTINAL: A: Nutrition needs Constipation P: Continue TF per nutrition  Pepcid PT for SUP  Continue senokot BID  Dulcolax suppository PRN  ENDOCRINE: A: Hyperglycemia P: SSI  ID: A:  Strep Pneumoniae CAP with Bacteremia Fever  - 1/13  & intermittently since still could be due to strep but will cover for HCAP given worsening hypoxemia P: Vanco stopped 1/16 Meropenem D6/x Consider 7-10 days abx Assess LE venous US given persistent low grade fevers  Follow fever curve / WBC trend  NEURO: A: Sedation needs for vent synchrony P: RASS goal: -2 to -2  Fentanyl gtt  Continue versed gtt,  would like to minimize if possible   Family:  Son-in-law updated at bedside 1/18 on plan of care.     Noe Gens, NP-C [facility] Pgr: [phone] or if no answer [phone] 06/12/2017, 10:50 AM

## 2017-06-12 NOTE — Progress Notes (Signed)
Called to pt bedside by RN due to low of spo2 85-86% post bed rotation.  Fio2 increased back up from 90 to 100%.  Lung recruitment done x2 minutes which pt tolerated well.  Spo2 now 88%.  RT will continue to monitor and assess pt as needed.

## 2017-06-12 NOTE — Progress Notes (Signed)
O2 saturations dropped again to 84-86%.  Pt lavaged and bagged for small to moderate thin pink-tinged secretions, repeat lung recruitment done x2 minutes and peep increased to +16 per ARDS protocol.  HR107, spo2 92%.  RN aware of events.  RT will continue to monitor and assess pt as needed.

## 2017-06-12 NOTE — Progress Notes (Signed)
Pt tolerated 20 percent rotation on bed well from 2000 until now.  When attempted to increase rotation to 30 percent.  Pts Spo2 decreased to 88 percent.    Similarly. Pts fentanyl turned down to 150 mcq/hr but had to increase back to 175 mcq/hr when he began to desaturate and wob increased.

## 2017-06-13 ENCOUNTER — Inpatient Hospital Stay (HOSPITAL_COMMUNITY): Payer: Medicare Other

## 2017-06-13 DIAGNOSIS — R609 Edema, unspecified: Secondary | ICD-10-CM

## 2017-06-13 LAB — CBC
HCT: 25.2 % — ABNORMAL LOW (ref 39.0–52.0)
HEMOGLOBIN: 7.9 g/dL — AB (ref 13.0–17.0)
MCH: 31.6 pg (ref 26.0–34.0)
MCHC: 31.3 g/dL (ref 30.0–36.0)
MCV: 100.8 fL — ABNORMAL HIGH (ref 78.0–100.0)
Platelets: 277 10*3/uL (ref 150–400)
RBC: 2.5 MIL/uL — AB (ref 4.22–5.81)
RDW: 15.1 % (ref 11.5–15.5)
WBC: 6.7 10*3/uL (ref 4.0–10.5)

## 2017-06-13 LAB — PROCALCITONIN: Procalcitonin: 0.21 ng/mL

## 2017-06-13 LAB — BASIC METABOLIC PANEL
ANION GAP: 3 — AB (ref 5–15)
BUN: 33 mg/dL — ABNORMAL HIGH (ref 6–20)
CO2: 33 mmol/L — ABNORMAL HIGH (ref 22–32)
Calcium: 7.8 mg/dL — ABNORMAL LOW (ref 8.9–10.3)
Chloride: 101 mmol/L (ref 101–111)
Creatinine, Ser: 0.76 mg/dL (ref 0.61–1.24)
GLUCOSE: 155 mg/dL — AB (ref 65–99)
Potassium: 5.5 mmol/L — ABNORMAL HIGH (ref 3.5–5.1)
Sodium: 137 mmol/L (ref 135–145)

## 2017-06-13 LAB — GLUCOSE, CAPILLARY
GLUCOSE-CAPILLARY: 164 mg/dL — AB (ref 65–99)
GLUCOSE-CAPILLARY: 110 mg/dL — AB (ref 65–99)
GLUCOSE-CAPILLARY: 120 mg/dL — AB (ref 65–99)
GLUCOSE-CAPILLARY: 122 mg/dL — AB (ref 65–99)
Glucose-Capillary: 123 mg/dL — ABNORMAL HIGH (ref 65–99)
Glucose-Capillary: 114 mg/dL — ABNORMAL HIGH (ref 65–99)

## 2017-06-13 LAB — MAGNESIUM: Magnesium: 2 mg/dL (ref 1.7–2.4)

## 2017-06-13 MED ORDER — SODIUM CHLORIDE 0.9 % IV SOLN
100.0000 ug/h | INTRAVENOUS | Status: DC
  Administered 2017-06-13: 175 ug/h via INTRAVENOUS
  Administered 2017-06-13: 150 ug/h via INTRAVENOUS
  Administered 2017-06-14: 175 ug/h via INTRAVENOUS
  Administered 2017-06-14: 200 ug/h via INTRAVENOUS
  Filled 2017-06-13 (×3): qty 50

## 2017-06-13 MED ORDER — SODIUM POLYSTYRENE SULFONATE PO POWD
30.0000 g | Freq: Once | ORAL | Status: AC
  Administered 2017-06-13: 30 g
  Filled 2017-06-13: qty 30

## 2017-06-13 MED ORDER — SODIUM POLYSTYRENE SULFONATE 15 GM/60ML PO SUSP
30.0000 g | Freq: Once | ORAL | Status: DC
  Filled 2017-06-13: qty 120

## 2017-06-13 MED ORDER — FENTANYL CITRATE (PF) 100 MCG/2ML IJ SOLN: 100.0000 ug | Freq: Once | INTRAMUSCULAR | Status: DC | PRN

## 2017-06-13 MED ORDER — CISATRACURIUM BOLUS VIA INFUSION
0.1000 mg/kg | Freq: Once | INTRAVENOUS | Status: AC
  Administered 2017-06-13: 6.9 mg via INTRAVENOUS
  Filled 2017-06-13: qty 7

## 2017-06-13 MED ORDER — LACTATED RINGERS IV BOLUS (SEPSIS)
500.0000 mL | Freq: Once | INTRAVENOUS | Status: AC
  Administered 2017-06-13: 500 mL via INTRAVENOUS

## 2017-06-13 MED ORDER — ARTIFICIAL TEARS OPHTHALMIC OINT
1.0000 "application " | TOPICAL_OINTMENT | Freq: Three times a day (TID) | OPHTHALMIC | Status: DC
  Administered 2017-06-13 – 2017-06-14 (×3): 1 via OPHTHALMIC
  Filled 2017-06-13: qty 3.5

## 2017-06-13 MED ORDER — CISATRACURIUM BESYLATE (PF) 200 MG/20ML IV SOLN
3.0000 ug/kg/min | INTRAVENOUS | Status: DC
  Administered 2017-06-13 – 2017-06-14 (×2): 3 ug/kg/min via INTRAVENOUS
  Filled 2017-06-13 (×2): qty 20

## 2017-06-13 MED ORDER — SODIUM CHLORIDE 0.9 % IV SOLN
2.0000 mg/h | INTRAVENOUS | Status: DC
  Administered 2017-06-13 – 2017-06-14 (×3): 6 mg/h via INTRAVENOUS
  Filled 2017-06-13 (×3): qty 10

## 2017-06-13 MED ORDER — ALBUMIN HUMAN 25 % IV SOLN
12.5000 g | Freq: Once | INTRAVENOUS | Status: AC
  Administered 2017-06-13: 12.5 g via INTRAVENOUS
  Filled 2017-06-13: qty 50

## 2017-06-13 MED ORDER — MIDAZOLAM HCL 2 MG/2ML IJ SOLN: 2.0000 mg | Freq: Once | INTRAMUSCULAR | Status: DC | PRN

## 2017-06-13 MED ORDER — MIDAZOLAM HCL 2 MG/2ML IJ SOLN
2.0000 mg | Freq: Once | INTRAMUSCULAR | Status: AC
  Administered 2017-06-13: 2 mg via INTRAVENOUS

## 2017-06-13 MED ORDER — LIP MEDEX EX OINT
TOPICAL_OINTMENT | CUTANEOUS | Status: AC
  Administered 2017-06-13: 22:00:00
  Filled 2017-06-13: qty 7

## 2017-06-13 MED ORDER — MIDAZOLAM BOLUS VIA INFUSION
2.0000 mg | INTRAVENOUS | Status: DC | PRN
  Filled 2017-06-13: qty 2

## 2017-06-13 MED ORDER — FUROSEMIDE 10 MG/ML IJ SOLN
40.0000 mg | Freq: Once | INTRAMUSCULAR | Status: AC
  Administered 2017-06-13: 40 mg via INTRAVENOUS
  Filled 2017-06-13: qty 4

## 2017-06-13 MED ORDER — FENTANYL CITRATE (PF) 100 MCG/2ML IJ SOLN
100.0000 ug | Freq: Once | INTRAMUSCULAR | Status: AC
  Administered 2017-06-13: 100 ug via INTRAVENOUS

## 2017-06-13 MED ORDER — DEXTROSE IN LACTATED RINGERS 5 % IV SOLN
INTRAVENOUS | Status: DC
  Administered 2017-06-13: 11:00:00 via INTRAVENOUS

## 2017-06-13 MED ORDER — FENTANYL BOLUS VIA INFUSION
50.0000 ug | INTRAVENOUS | Status: DC | PRN
  Filled 2017-06-13: qty 50

## 2017-06-13 NOTE — Progress Notes (Signed)
PULMONARY / CRITICAL CARE MEDICINE   Name: Patrick Carr MRN: [id number] DOB: 1933-10-08    ADMISSION DATE:  06/16/2017 CONSULTATION DATE: 1/8  REFERRING MD:  Denton Brick  CHIEF COMPLAINT:  Delirium and hypoxia   BRIEF: 84 y/o male with a history of limited stage small cell lung cancer treated in 2015 is here now with acute respiratory failure with hypoxemia due to severe CAP. At baseline he remains active and lives independently.       CULTURES: Blood cultures 1/5 > Streptococcus pneumoniae Resp culture 1/9 > GPC, rare candida Bronch wash culture 1/13 > normal flora Blood culture 1/13 >   ANTIBIOTICS: azithromycin 1/5 > 1/9 Rocephin 1/5 > 1/13 Vanc 1/13 > 1/16 Mero 1/13 >     LINES/TUBES: OETT 1/8 >> RUE PICC 1/10 >>   SIGNIFICANT EVENTS: 1/05  Admit with strep pneumo CAP  1/06 Echo > LVEF 55-60%, RVSP 51 mmHg 1/08 CT chest > dense consolidation RML and RLL, small effusions, emphysema. INTUBATED 1/09 bronchoscopy > airways within normal limits, no mass; bloody secretions noted 1/05 CT head > Negative 1/13 bedside ultrasound > small right effusion 1/13 FOB > bloody secretions in R lung, particularly RML / RLL but no mucus plugging / occlusion 1/13 More episodes of hypoxemia, FOB, started on vasopressors 1/18 - Ollin has continued to have periodic episodes of desaturation that required increases in his PEEP and FiO2.  Currently on FiO2 80, PEEP 14 after recruitment maneuver yesterday afternoon. Chest x-ray shows significant right lower lobe consolidation with some more hazy left-sided infiltrate, question size of R effusion   SUBJECTIVE/OVERNIGHT/INTERVAL HX 1/19 - 90% fio2/pee 14. On 66mcg neo Making urine but concentrated. Chest tube placed Right side yesterday - 1500cc out so far and bloody - no chemistry yet. ? Cytology pending.On 150 fent, versed 5mg  - RASS -4  VITAL SIGNS: BP (!) 117/38   Pulse 100   Temp (!) 100.9 F (38.3 C)   Resp (!) 26   Ht 5\' 5"   (1.651 m)   Wt 69.1 kg (152 lb 5.4 oz)   SpO2 90%   BMI 25.35 kg/m   HEMODYNAMICS: CVP:  [9 mmHg] 9 mmHg  VENTILATOR SETTINGS: Vent Mode: PRVC FiO2 (%):  [80 %-100 %] 90 % Set Rate:  [26 bmp] 26 bmp Vt Set:  [370 mL] 370 mL PEEP:  [14 cmH20-16 cmH20] 14 cmH20 Plateau Pressure:  [25 cmH20-32 cmH20] 25 cmH20  INTAKE / OUTPUT:  Intake/Output Summary (Last 24 hours) at 06/13/2017 1009 Last data filed at 06/13/2017 3532 Gross per 24 hour  Intake 3348.32 ml  Output 3300 ml  Net 48.32 ml     PHYSICAL EXAMINATION:  General Appearance:    Looks criticall ill  Head:    Normocephalic, without obvious abnormality, atraumatic  Eyes:    PERRL - yes, conjunctiva/corneas - clear      Ears:    Normal external ear canals, both ears  Nose:   NG tube - no  Throat:  ETT TUBE - yes , OG tube - yes  Neck:   Supple,  No enlargement/tenderness/nodules     Lungs:     Clear to auscultation bilaterally, Ventilator   Synchrony - yes  Chest wall:    No deformity  Heart:    S1 and S2 normal, no murmur, CVP - no.  Pressors - yes on neo  Abdomen:     Soft, no masses, no organomegaly  Genitalia:    Not done  Rectal:   not  done  Extremities:   Extremities- intact     Skin:   Intact in exposed areas .      Neurologic:   Sedation - fent and versed -> RASS - -4     PULMONARY Recent Labs  Lab 06/09/17 1447 06/12/17 0345  PHART 7.329* 7.370  PCO2ART 66.2* 57.1*  PO2ART 85.4 118*  HCO3 33.9* 31.5*  O2SAT 95.8 98.5    CBC Recent Labs  Lab 06/10/17 0720 06/12/17 0430 06/13/17 0304  HGB 9.5* 7.8* 7.9*  HCT 29.7* 25.0* 25.2*  WBC 8.7 7.0 6.7  PLT 242 240 277    COAGULATION No results for input(s): INR in the last 168 hours.  CARDIAC  No results for input(s): TROPONINI in the last 168 hours. No results for input(s): PROBNP in the last 168 hours.   CHEMISTRY Recent Labs  Lab 06/09/17 0328 06/10/17 0720 06/12/17 0300 06/12/17 0422 06/13/17 0304  NA 140 135 134* 138 137  K  5.1 5.0 4.2 5.0 5.5*  CL 100* 99* 106 103 101  CO2 35* 32 26 32 33*  GLUCOSE 128* 108* 350* 158* 155*  BUN 25* 24* 25* 30* 33*  CREATININE 0.61 0.62 0.57* 0.69 0.76  CALCIUM 7.8* 7.7* 5.9* 7.3* 7.8*  MG 2.1 1.9 1.6* 1.9 2.0  PHOS 2.3* 2.5  --   --   --    Estimated Creatinine Clearance: 63 mL/min (by C-G formula based on SCr of 0.76 mg/dL).   LIVER No results for input(s): AST, ALT, ALKPHOS, BILITOT, PROT, ALBUMIN, INR in the last 168 hours.   INFECTIOUS No results for input(s): LATICACIDVEN, PROCALCITON in the last 168 hours.   ENDOCRINE CBG (last 3)  Recent Labs    06/12/17 2312 06/13/17 0306 06/13/17 0714  GLUCAP 80 164* 110*         IMAGING x48h  - image(s) personally visualized  -   highlighted in bold Dg Chest Port 1 View  Result Date: 06/13/2017 CLINICAL DATA:  84 year old male with history of lung cancer. Respiratory failure. Right pleural effusion requiring chest tube placement yesterday. EXAM: PORTABLE CHEST 1 VIEW COMPARISON:  06/12/2017 and earlier. FINDINGS: Portable AP semi upright view at 0446 hours. Endotracheal tube tip is stable between the clavicle and carina. Enteric tube courses to the abdomen. Stable right PICC line. Stable right chest tube. No pneumothorax. Decreased but not resolved veiling opacity in the right lung following chest tube placement. Confluent right apical opacity, in part related to partially calcified scarring, and intervening streaky perihilar and interstitial opacity in the right lung remain. Underlying large lung volumes. Stable left lung with multifocal patchy interstitial opacity. No worsening ventilation since yesterday. Stable cardiac size and mediastinal contours. IMPRESSION: 1.  Stable lines and tubes. 2. Decreased but not resolved veiling opacity in the right lung following chest tube placement. No pneumothorax. 3. Otherwise stable ventilation. Electronically Signed   By: Genevie Ann M.D.   On: 06/13/2017 07:19   Dg Chest Port 1  View  Result Date: 06/12/2017 CLINICAL DATA:  Status post chest tube placement. EXAM: PORTABLE CHEST 1 VIEW COMPARISON:  06/12/2017 at 0440 hours FINDINGS: Chest tube overlying the mid right chest. Improved aeration in the right lower chest with residual airspace disease throughout the right lung. No definite pneumothorax. Concern for new peripheral airspace disease densities in the mid left lung. Pleural and parenchymal densities at the left lung base. PICC line tip in the lower SVC region. Endotracheal tube is well positioned above the carina. Nasogastric tube  extends into the abdomen. IMPRESSION: Placement of right chest tube. Improved aeration in the right lower chest. Negative for pneumothorax. Bilateral airspace disease, right side greater than left. Small left pleural effusion. Electronically Signed   By: Markus Daft M.D.   On: 06/12/2017 17:34   Dg Chest Port 1 View  Result Date: 06/12/2017 CLINICAL DATA:  Hypoxia EXAM: PORTABLE CHEST 1 VIEW COMPARISON:  June 11, 2017 FINDINGS: Endotracheal tube tip is 5.2 cm above the carina. Nasogastric tube tip and side port below the diaphragm. Central catheter tip is in the superior vena cava near the cavoatrial junction. No pneumothorax. There is moderate pleural effusion on the right with much smaller effusion on the left. There is airspace consolidation throughout much of the right mid and lower lung zones, stable. There is focal consolidation medial left base which appears slightly more prominent than on 1 day prior study. There is aortic atherosclerosis. No adenopathy. There is calcification in each carotid artery. There is degenerative change in each shoulder. IMPRESSION: Tube and catheter positions as described without pneumothorax. Bilateral pleural effusions, larger on the right than on the left. Areas of airspace consolidation throughout much of the right mid lower lung zone as well as in the medial left base. Increased opacity in the left base is  slightly more prominent than on prior study. Stable cardiac silhouette. There is aortic atherosclerosis. There is also calcification in both carotid arteries. Aortic Atherosclerosis (ICD10-I70.0). Electronically Signed   By: Lowella Grip III M.D.   On: 06/12/2017 07:19   Dg Chest Port 1 View  Result Date: 06/11/2017 CLINICAL DATA:  History of hypertension and lung cancer. Acute respiratory failure. EXAM: PORTABLE CHEST 1 VIEW COMPARISON:  06/10/2017 FINDINGS: Similar appearance to yesterday's study. Endotracheal tube tip 4 cm above the carina. Nasogastric tube enters the abdomen. Right arm PICC tip at the SVC RA junction. Moderate size right effusion with volume loss in the right lower lung. Interstitial density in both lungs, right more than left. IMPRESSION: No significant change since yesterday. Moderate size right effusion with right lower lung volume loss. Interstitial density bilaterally right more than left. Electronically Signed   By: Nelson Chimes M.D.   On: 06/11/2017 11:51      DISCUSSION: 84 y/o male with prior lung cancer here with severe community acquired pneumonia from strep pneumo with associated bacteremia.  As of 1/13 has low grade fever, worsening episodes of hypoxemia that have required increase fiO2 and PEEP   ASSESSMENT / PLAN:  PULMONARY: A: Acute respiratory failure with hypoxemia > worsening 1/13 mucus plugging? Worsening pulm edema? Severe community acquired pneumonia from strep pneumo Likely acute pulmonary edema R pleural effusion  Repeat bronchoscopy 1/13> some bloody secretions, no mucus plugging  06/13/2017 - severe ARDS physiology + sp RT chest tube yesterday  P: Continue PRVC at 6 cc/kg Start 48h nimbex 06/13/2017; Consider  proning if no progress   CARDIOVASCULAR: A:  Multifocal atrial tachycardia - resolved 1/14 Circulatory shock 1/13, presumably sedation related as doesn't appear septic and physical exam consistent with volume  overload  06/13/2017 - on neo  P: Wean phenylephrine as able, goal SBP greater than 90 Continue lipitor  Hydralazine available prn, has not been needed.  Continue ASA 81 mg QD  RENAL: A: Hyperkalemia   P: LR bolus  kayexalate Follow BMP, urine output Continue Free water 200 ml Q4  Replace electrolytes as indicated Avoid nephrotoxic agents and ensure adequate renal perfusion  GASTROINTESTINAL: A: Nutrition needs P: Continue TF,  tolerating well 55 cc/h Protonix for SUP  ENDOCRINE: A: Hyperglycemia P: SSI protocol  ID: A:  Strep Pneumoniae CAP with Bacteremia \ Fever 1/13 - still could be due to strep but will cover for HCAP given worsening hypoxemia P: Stopped vanco 1/16, continue meropenem Narrowed based on cx data from 1/13  NEURO: A: Sedation needs for vent synchrony   06/13/2017 - RASS -4  P: Fentanyl gtt Continue versed Start nimbex - BIS goal < 60  Family:  Discussed case with his wife and daughter at bedside 1/16. Updated daughter, wife and grandaugther 06/13/2017       The patient is critically ill with multiple organ systems failure and requires high complexity decision making for assessment and support, frequent evaluation and titration of therapies, application of advanced monitoring technologies and extensive interpretation of multiple databases.   Critical Care Time devoted to patient care services described in this note is  30  Minutes. This time reflects time of care of this signee Dr Brand Males. This critical care time does not reflect procedure time, or teaching time or supervisory time of PA/NP/Med student/Med Resident etc but could involve care discussion time    Dr. Brand Males, M.D., [facility].C.P Pulmonary and Critical Care Medicine Staff Physician [facility] Pager: [phone], If no answer or between  15:00h - 7:00h: call [phone]  06/13/2017 10:09 AM

## 2017-06-13 NOTE — Progress Notes (Signed)
Pharmacy Antibiotic Note  Patrick Carr is a 84 y.o. male admitted on 06/02/2017  history of limited stage small cell lung cancer treated in 2015 is here now with acute respiratory failure with hypoxemia due to severe CAP.  Pt has been on ceftriaxone for strep pneumo bacteremia, but pharmacy has been consulted for vancomycin and merrem dosing for HCAP.  Today, 06/13/2017: Day 10 abx, Day 7 Meropenem Remains febrile, Tm 101.7, Tc 100.8 WBC 6.7 SCr 0.76, stable  Plan: Continue meropenem 1gm IV q8h Follow up length of therapy Follow renal function, cultures and clinical course  Height: _0  (165.1 cm) Weight: 152 lb 5.4 oz (69.1 kg) IBW/kg (Calculated) : 61.5  Temp (24hrs), Avg:101 F (38.3 C), Min:100.4 F (38 C), Max:101.5 F (38.6 C)  Recent Labs  Lab 06/08/17 0450 06/09/17 0328 06/10/17 0720 06/12/17 0300 06/12/17 0422 06/12/17 0430 06/13/17 0304  WBC 12.1* 9.5 8.7  --   --  7.0 6.7  CREATININE 0.53* 0.61 0.62 0.57* 0.69  --  0.76    Estimated Creatinine Clearance: 63 mL/min (by C-G formula based on SCr of 0.76 mg/dL).    Allergies  Allergen Reactions  . Contrast Media [Iodinated Diagnostic Agents]   . Other Other (See Comments)    Pneumonia vaccine causes arm swelling, caused redness  . Pneumococcal Vaccines     Arm swelling    Antimicrobials this admission: 1/6 ceftriaxone >> 1/13 1/6 azith >> 1/8 1/13 vanc >>1/16 1/13 merrem >>  Dose adjustments this admission:   Microbiology results: 1/5 BCx: S.Pneumoniae (pan susc) 1/5 Respiratory panel: none detected 1/6 MRSC PCR: negative 1/9 resp cx (BAL): rare candida tropicalis 1/13 Bronch wash: normal flora 1/13 BCx (pediatric bottles): ngtd  Thank you for allowing pharmacy to be a part of this patient's care.  Gretta Arab PharmD, BCPS Pager [phone] 06/13/2017 1:35 PM

## 2017-06-13 NOTE — Progress Notes (Signed)
Preliminary results by tech - Lower Ext. Venous Duplex Completed. Negative for deep and superficial vein thrombosis in both legs. Oda Cogan, BS, RDMS, RVT

## 2017-06-13 NOTE — Progress Notes (Signed)
CCM called. Pts current sats 87 on 100% FiO2 PEEP of 14. Ordered STAT CXR

## 2017-06-13 NOTE — Progress Notes (Signed)
[facility] Progress Note Patient Name: Patrick Carr DOB: 10-Aug-1933 MRN: [id number]   Date of Service  06/13/2017  HPI/Events of Note  Portable CXR with diffuse multifocal patchy interstitial opacity R lung and L base c/w ARDS. No pneumothorax. Bedside nurse reports to last CVP = 12. Last Na+ = 137 and Creatinine = 0.76/ Albumin = 1.9.   eICU Interventions  Will order: 1. Increase PEEP to 16.  2. 25% Albumin 12.5 gm IV now. 3. Lasix 40 mg IV 30 minutes at 25% Albumin infusion completed.      Intervention Category Major Interventions: Hypoxemia - evaluation and management  Lysle Dingwall 06/13/2017, 9:47 PM

## 2017-06-14 ENCOUNTER — Inpatient Hospital Stay (HOSPITAL_COMMUNITY): Payer: Medicare Other

## 2017-06-14 LAB — CBC WITH DIFFERENTIAL/PLATELET
Basophils Absolute: 0 10*3/uL (ref 0.0–0.1)
Basophils Relative: 0 %
Eosinophils Absolute: 0.1 10*3/uL (ref 0.0–0.7)
Eosinophils Relative: 1 %
HEMATOCRIT: 24.5 % — AB (ref 39.0–52.0)
HEMOGLOBIN: 7.7 g/dL — AB (ref 13.0–17.0)
LYMPHS ABS: 0.3 10*3/uL — AB (ref 0.7–4.0)
Lymphocytes Relative: 4 %
MCH: 32 pg (ref 26.0–34.0)
MCHC: 31.4 g/dL (ref 30.0–36.0)
MCV: 101.7 fL — ABNORMAL HIGH (ref 78.0–100.0)
MONOS PCT: 14 %
Monocytes Absolute: 0.9 10*3/uL (ref 0.1–1.0)
NEUTROS ABS: 5.3 10*3/uL (ref 1.7–7.7)
NEUTROS PCT: 81 %
Platelets: 259 10*3/uL (ref 150–400)
RBC: 2.41 MIL/uL — AB (ref 4.22–5.81)
RDW: 15.1 % (ref 11.5–15.5)
WBC: 6.6 10*3/uL (ref 4.0–10.5)

## 2017-06-14 LAB — GLUCOSE, CAPILLARY
GLUCOSE-CAPILLARY: 137 mg/dL — AB (ref 65–99)
GLUCOSE-CAPILLARY: 116 mg/dL — AB (ref 65–99)
Glucose-Capillary: 138 mg/dL — ABNORMAL HIGH (ref 65–99)
Glucose-Capillary: 130 mg/dL — ABNORMAL HIGH (ref 65–99)

## 2017-06-14 LAB — PROCALCITONIN: Procalcitonin: 0.29 ng/mL

## 2017-06-14 LAB — HEPATIC FUNCTION PANEL
ALBUMIN: 1.5 g/dL — AB (ref 3.5–5.0)
ALT: 25 U/L (ref 17–63)
AST: 36 U/L (ref 15–41)
Alkaline Phosphatase: 61 U/L (ref 38–126)
Bilirubin, Direct: <0.1 mg/dL — ABNORMAL LOW (ref 0.1–0.5)
TOTAL PROTEIN: 4.7 g/dL — AB (ref 6.5–8.1)
Total Bilirubin: 0.5 mg/dL (ref 0.3–1.2)

## 2017-06-14 LAB — LACTIC ACID, PLASMA: LACTIC ACID, VENOUS: 0.9 mmol/L (ref 0.5–1.9)

## 2017-06-14 LAB — BASIC METABOLIC PANEL
ANION GAP: 5 (ref 5–15)
BUN: 28 mg/dL — ABNORMAL HIGH (ref 6–20)
CHLORIDE: 100 mmol/L — AB (ref 101–111)
CO2: 33 mmol/L — AB (ref 22–32)
Calcium: 7.7 mg/dL — ABNORMAL LOW (ref 8.9–10.3)
Creatinine, Ser: 0.67 mg/dL (ref 0.61–1.24)
GFR calc non Af Amer: >60 mL/min (ref >60)
Glucose, Bld: 154 mg/dL — ABNORMAL HIGH (ref 65–99)
POTASSIUM: 5.1 mmol/L (ref 3.5–5.1)
Sodium: 138 mmol/L (ref 135–145)

## 2017-06-14 LAB — MAGNESIUM: Magnesium: 1.9 mg/dL (ref 1.7–2.4)

## 2017-06-14 LAB — PHOSPHORUS: PHOSPHORUS: 3.6 mg/dL (ref 2.5–4.6)

## 2017-06-14 MED ORDER — FUROSEMIDE 10 MG/ML IJ SOLN
INTRAMUSCULAR | Status: AC
  Administered 2017-06-14: 80 mg
  Filled 2017-06-14: qty 8

## 2017-06-14 MED ORDER — SODIUM CHLORIDE 0.9 % IV SOLN: 1.0000 mg/h | INTRAVENOUS | Status: DC

## 2017-06-14 MED ORDER — MIDAZOLAM BOLUS VIA INFUSION (WITHDRAWAL LIFE SUSTAINING TX)
5.0000 mg | INTRAVENOUS | Status: DC | PRN
  Filled 2017-06-14: qty 20

## 2017-06-14 MED ORDER — FENTANYL BOLUS VIA INFUSION
50.0000 ug | INTRAVENOUS | Status: DC | PRN
  Filled 2017-06-14: qty 200

## 2017-06-14 MED ORDER — MAGNESIUM SULFATE 2 GM/50ML IV SOLN: 2.0000 g | Freq: Once | INTRAVENOUS | Status: DC

## 2017-06-14 MED ORDER — SODIUM CHLORIDE 0.9 % IV SOLN: 100.0000 ug/h | INTRAVENOUS | Status: DC

## 2017-06-14 MED ORDER — FUROSEMIDE 10 MG/ML IJ SOLN: 80.0000 mg | Freq: Three times a day (TID) | INTRAMUSCULAR | Status: DC

## 2017-06-15 LAB — PATHOLOGIST SMEAR REVIEW

## 2017-06-16 ENCOUNTER — Telehealth: Payer: Self-pay

## 2017-06-16 LAB — BODY FLUID CULTURE: Culture: NO GROWTH

## 2017-06-16 NOTE — Telephone Encounter (Signed)
On 06/16/17 I received a d/c from [employer] (original). The d/c is for cremation. The patient is a patient of Doctor Ramaswamy. The d/c will be taken to Pulmonary Unit @ [facility] for signature.   On 06/17/17 I received the d/c back from [place]. I got the d/c ready and called the funeral home to let them know the d/c is ready for pickup. I also faxed a copy to the funeral home per the funeral home request.

## 2017-06-19 ENCOUNTER — Other Ambulatory Visit: Payer: Self-pay | Admitting: Nurse Practitioner

## 2017-06-26 NOTE — Discharge Summary (Signed)
DISCHARGE SUMMARY    Date of admit: 06/20/2017  2:33 PM Date of discharge: 07-14-2017  3:17 PM Length of Stay: 15 days  PCP is Deland Pretty, MD   PROBLEM LIST Principal Problem: SEvere ARDS Sepsis due to Pulm Source Streptoccus Pneumoniae bacteremia and CAP Active Problems:   Hypertension   Small cell carcinoma of right lung ([place])   Community acquired pneumonia   Acute respiratory failure with hypoxemia ([place])   CAP (community acquired pneumonia)   Malnutrition of moderate degree   Pleural effusion    SUMMARY Patrick Carr was 84 y.o. patient with 84 y/o male with a history of limited stage small cell lung cancer treated in 2015 is here now with acute respiratory failure with hypoxemia due to severe CAP. At baseline he remains active and lives independently.         has a past medical history of BPH (benign prostatic hyperplasia), Cancer ([place]), Hernia, inguinal (left ), History of blood transfusion, History of kidney stones, Hyperlipidemia, Hypertension, Irregular heart rate, Lesion of right lung, Phlebitis (03/20/14), Radiation (02/08/14-03/21/14), RBBB, and Renal cysts, acquired, bilateral.   has a past surgical history that includes Sinus artery surgery; No past surgeries; Video bronchoscopy with endobronchial navigation (N/A, 01/18/2014); Video bronchoscopy with endobronchial ultrasound (N/A, 01/18/2014); Video bronchoscopy (N/A, 07/04/2014); and ir generic historical (02/13/2016).   Admitted on 06/25/2017 with    1/05  Admit with strep pneumo CAP  1/06 Echo > LVEF 55-60%, RVSP 51 mmHg 1/08 CT chest > dense consolidation RML and RLL, small effusions, emphysema. INTUBATED 1/09 bronchoscopy > airways within normal limits, no mass; bloody secretions noted 1/05 CT head > Negative 1/13 bedside ultrasound > small right effusion 1/13 FOB > bloody secretions in R lung, particularly RML / RLL but no mucus plugging / occlusion 1/13 More episodes of hypoxemia, FOB,  started on vasopressors 1/18 - [place] has continued to have periodic episodes of desaturation that required increases in his PEEP and FiO2.  Currently on FiO2 80, PEEP 14 after recruitment maneuver yesterday afternoon. Chest x-ray shows significant right lower lobe consolidation with some more hazy left-sided infiltrate, question size of R effusion   1/19 - 90% fio2/pee 14. On 24mcg neo Making urine but concentrated. Chest tube placed Right side yesterday - 1500cc out so far and bloody - no chemistry yet. ? Cytology pending.On 150 fent, versed 5mg  - RASS -4  2022-07-14 - worsening aRDS physilogy desopite lasix yesterdya. 100% fio2, peep 16. On neo 149mcg. BIS 40s. On niimbex, feng and versed gt with TOF 2 of 8    Conversations held with family. With continued decline total palliation terminal cae approach adopted. Nimbex stopped. Ensured return of TOF and given enough half life for nimbex clearance and patient terminally extubated. Died on 07-14-2017       SIGNED Dr. Brand Males, M.D., F.C.C.P Pulmonary and Critical Care Medicine Staff Physician [facility] Pager: [phone], If no answer or between  15:00h - 7:00h: call [phone]  06/25/2017 12:36 PM

## 2017-06-26 NOTE — Progress Notes (Signed)
PULMONARY / CRITICAL CARE MEDICINE   Name: Patrick Carr MRN: [id number] DOB: Jun 06, 1933    ADMISSION DATE:  06/09/2017 CONSULTATION DATE: 1/8  REFERRING MD:  Denton Brick  CHIEF COMPLAINT:  Delirium and hypoxia   BRIEF: 84 y/o male with a history of limited stage small cell lung cancer treated in 2015 is here now with acute respiratory failure with hypoxemia due to severe CAP. At baseline he remains active and lives independently.       CULTURES: Blood cultures 1/5 > Streptococcus pneumoniae Resp culture 1/9 > GPC, rare candida Bronch wash culture 1/13 > normal flora Blood culture 1/13 >   ANTIBIOTICS: azithromycin 1/5 > 1/9 Rocephin 1/5 > 1/13 Vanc 1/13 > 1/16 Mero 1/13 >     LINES/TUBES: OETT 1/8 >> RUE PICC 1/10 >>   SIGNIFICANT EVENTS: 1/05  Admit with strep pneumo CAP  1/06 Echo > LVEF 55-60%, RVSP 51 mmHg 1/08 CT chest > dense consolidation RML and RLL, small effusions, emphysema. INTUBATED 1/09 bronchoscopy > airways within normal limits, no mass; bloody secretions noted 1/05 CT head > Negative 1/13 bedside ultrasound > small right effusion 1/13 FOB > bloody secretions in R lung, particularly RML / RLL but no mucus plugging / occlusion 1/13 More episodes of hypoxemia, FOB, started on vasopressors 1/18 - [place] has continued to have periodic episodes of desaturation that required increases in his PEEP and FiO2.  Currently on FiO2 80, PEEP 14 after recruitment maneuver yesterday afternoon. Chest x-ray shows significant right lower lobe consolidation with some more hazy left-sided infiltrate, question size of R effusion   1/19 - 90% fio2/pee 14. On 82mcg neo Making urine but concentrated. Chest tube placed Right side yesterday - 1500cc out so far and bloody - no chemistry yet. ? Cytology pending.On 150 fent, versed 5mg  - RASS -4   SUBJECTIVE/OVERNIGHT/INTERVAL HX 1/20 - worsening aRDS physilogy desopite lasix yesterdya. 100% fio2, peep 16. On neo 131mcg. BIS  40s. On niimbex, feng and versed gt with TOF 2 of 8   VITAL SIGNS: BP (!) 90/49   Pulse (!) 101   Temp 99.1 F (37.3 C)   Resp (!) 26   Ht 5\' 5"  (1.651 m)   Wt 70.2 kg (154 lb 12.2 oz)   SpO2 95%   BMI 25.75 kg/m   HEMODYNAMICS: CVP:  [6 mmHg-14 mmHg] 13 mmHg  VENTILATOR SETTINGS: Vent Mode: PRVC FiO2 (%):  [80 %-100 %] 90 % Set Rate:  [26 bmp] 26 bmp Vt Set:  [370 mL] 370 mL PEEP:  [14 cmH20-16 cmH20] 16 cmH20 Plateau Pressure:  [28 cmH20-31 cmH20] 31 cmH20  INTAKE / OUTPUT:  Intake/Output Summary (Last 24 hours) at 2017-06-17 1020 Last data filed at 06-17-2017 1000 Gross per 24 hour  Intake 5556.32 ml  Output 3005 ml  Net 2551.32 ml     PHYSICAL EXAMINATION:  General Appearance:    Looks criticall ill   Head:    Normocephalic, without obvious abnormality, atraumatic  Eyes:    PERRL - yes, conjunctiva/corneas - clear      Ears:    Normal external ear canals, both ears  Nose:   NG tube - no  Throat:  ETT TUBE - yes , OG tube - yes  Neck:   Supple,  No enlargement/tenderness/nodules     Lungs:     Clear to auscultation bilaterally, Ventilator   Synchrony - yes  Chest wall:    No deformity  Heart:    S1 and S2 normal, no  murmur, CVP - no.  Pressors - yes neo 112mcg  Abdomen:     Soft, no masses, no organomegaly  Genitalia:    Not done  Rectal:   not done  Extremities:   Extremities- intact     Skin:   Intact in exposed areas . Sacral area - unstageable post admit < 1/2 inch per RN. Penile wound + per RN     Neurologic:   Sedation - nimbex, fent, versed -> RASS - -5/BIS 40s . Moves all 4s - TOF 2 of 8. CAM-ICU - na . Orientation - na      PULMONARY Recent Labs  Lab 06/09/17 1447 06/12/17 0345  PHART 7.329* 7.370  PCO2ART 66.2* 57.1*  PO2ART 85.4 118*  HCO3 33.9* 31.5*  O2SAT 95.8 98.5    CBC Recent Labs  Lab 06/12/17 0430 06/13/17 0304 July 09, 2017 0330  HGB 7.8* 7.9* 7.7*  HCT 25.0* 25.2* 24.5*  WBC 7.0 6.7 6.6  PLT 240 277 259     COAGULATION No results for input(s): INR in the last 168 hours.  CARDIAC  No results for input(s): TROPONINI in the last 168 hours. No results for input(s): PROBNP in the last 168 hours.   CHEMISTRY Recent Labs  Lab 06/09/17 0328 06/10/17 0720 06/12/17 0300 06/12/17 0422 06/13/17 0304 07/09/2017 0330  NA 140 135 134* 138 137 138  K 5.1 5.0 4.2 5.0 5.5* 5.1  CL 100* 99* 106 103 101 100*  CO2 35* 32 26 32 33* 33*  GLUCOSE 128* 108* 350* 158* 155* 154*  BUN 25* 24* 25* 30* 33* 28*  CREATININE 0.61 0.62 0.57* 0.69 0.76 0.67  CALCIUM 7.8* 7.7* 5.9* 7.3* 7.8* 7.7*  MG 2.1 1.9 1.6* 1.9 2.0 1.9  PHOS 2.3* 2.5  --   --   --  3.6   Estimated Creatinine Clearance: 63 mL/min (by C-G formula based on SCr of 0.67 mg/dL).   LIVER Recent Labs  Lab 2017-07-09 0330  AST 36  ALT 25  ALKPHOS 61  BILITOT 0.5  PROT 4.7*  ALBUMIN 1.5*     INFECTIOUS Recent Labs  Lab 06/13/17 1044 2017-07-09 0330  LATICACIDVEN  --  0.9  PROCALCITON 0.21 0.29     ENDOCRINE CBG (last 3)  Recent Labs    06/13/17 2322 07/09/17 0322 07-09-17 0753  GLUCAP 114* 116* 130*         IMAGING x48h  - image(s) personally visualized  -   highlighted in bold Dg Chest Port 1 View  Result Date: 07/09/2017 CLINICAL DATA:  Hypoxia EXAM: PORTABLE CHEST 1 VIEW COMPARISON:  June 13, 2017 FINDINGS: Endotracheal tube tip is 4.1 cm above the carina. Central catheter tip is in the superior vena cava. Nasogastric tube tip and side port below the diaphragm. Chest tube present on the right, unchanged in position. No pneumothorax. There are layering pleural effusions bilaterally. There is airspace consolidation throughout much of the right mid and lower lung zones. There is consolidation in the left mid and lower lung zones as well with consolidation greatest medially. Heart is upper normal in size with pulmonary vascularity within normal limits. There is aortic atherosclerosis. No adenopathy. No bone lesions  appreciable. IMPRESSION: Tube and catheter positions as described without evident pneumothorax. Airspace consolidation bilaterally, likely multifocal pneumonia, although a degree of pulmonary edema may be present. Both entities may exist concurrently. Small pleural effusions noted bilaterally. Stable cardiac silhouette.  There is aortic atherosclerosis. Aortic Atherosclerosis (ICD10-I70.0). Electronically Signed   By: Gwyndolyn Saxon  Jasmine December III M.D.   On: 2017/06/21 07:20   Dg Chest Port 1 View  Result Date: 06/13/2017 CLINICAL DATA:  84 year old male with acute respiratory failure. EXAM: PORTABLE CHEST 1 VIEW COMPARISON:  Chest radiograph dated 04/13/2018 FINDINGS: Endotracheal tube, right-sided PICC, partially visualized enteric tube, and right chest tube appear in similar position. There are bilateral pleural effusions with bilateral mid to lower lung field interstitial and airspace opacities similar to prior radiograph. Bilateral infrahilar densities, new or worsened since the prior radiograph. Right apical pleural thickening/scarring. No identifiable pneumothorax. The cardiac silhouette is within normal limits. The osseous structures are intact. IMPRESSION: 1. Support devices in similar position. 2. Overall no significant interval change in the pleural effusion and bilateral mid to lower lung field interstitial and airspace densities compared to the prior radiograph. 3. Bilateral infrahilar atelectasis versus infiltrate, new or worsened compared to the prior radiograph. Electronically Signed   By: Anner Crete M.D.   On: 06/13/2017 21:38   Dg Chest Port 1 View  Result Date: 06/13/2017 CLINICAL DATA:  84 year old male with history of lung cancer. Respiratory failure. Right pleural effusion requiring chest tube placement yesterday. EXAM: PORTABLE CHEST 1 VIEW COMPARISON:  06/12/2017 and earlier. FINDINGS: Portable AP semi upright view at 0446 hours. Endotracheal tube tip is stable between the clavicle and  carina. Enteric tube courses to the abdomen. Stable right PICC line. Stable right chest tube. No pneumothorax. Decreased but not resolved veiling opacity in the right lung following chest tube placement. Confluent right apical opacity, in part related to partially calcified scarring, and intervening streaky perihilar and interstitial opacity in the right lung remain. Underlying large lung volumes. Stable left lung with multifocal patchy interstitial opacity. No worsening ventilation since yesterday. Stable cardiac size and mediastinal contours. IMPRESSION: 1.  Stable lines and tubes. 2. Decreased but not resolved veiling opacity in the right lung following chest tube placement. No pneumothorax. 3. Otherwise stable ventilation. Electronically Signed   By: Genevie Ann M.D.   On: 06/13/2017 07:19   Dg Chest Port 1 View  Result Date: 06/12/2017 CLINICAL DATA:  Status post chest tube placement. EXAM: PORTABLE CHEST 1 VIEW COMPARISON:  06/12/2017 at 0440 hours FINDINGS: Chest tube overlying the mid right chest. Improved aeration in the right lower chest with residual airspace disease throughout the right lung. No definite pneumothorax. Concern for new peripheral airspace disease densities in the mid left lung. Pleural and parenchymal densities at the left lung base. PICC line tip in the lower SVC region. Endotracheal tube is well positioned above the carina. Nasogastric tube extends into the abdomen. IMPRESSION: Placement of right chest tube. Improved aeration in the right lower chest. Negative for pneumothorax. Bilateral airspace disease, right side greater than left. Small left pleural effusion. Electronically Signed   By: Markus Daft M.D.   On: 06/12/2017 17:34      DISCUSSION: 84 y/o male with prior lung cancer here with severe community acquired pneumonia from strep pneumo with associated bacteremia.  As of 1/13 has low grade fever, worsening episodes of hypoxemia that have required increase fiO2 and  PEEP   ASSESSMENT / PLAN:  PULMONARY: A: Acute respiratory failure with hypoxemia > worsening 1/13 mucus plugging? Worsening pulm edema? Severe community acquired pneumonia from strep pneumo Likely acute pulmonary edema R pleural effusion  Repeat bronchoscopy 1/13> some bloody secretions, no mucus plugging  06-21-2017 - severe ARDS physiology + sp RT chest tube  2d ago.   P: Stat cxr for chest tube position DC  nimbex Full vent support High dose lasix If no response, terminal wean later today per family request   CARDIOVASCULAR: A:  Multifocal atrial tachycardia - resolved 1/14 Circulatory shock 1/13, presumably sedation related as doesn't appear septic and physical exam consistent with volume overload  07-06-2017 - on neo  P: Wean phenylephrine as able, goal SBP greater than 90 Continue lipitor  Hydralazine available prn, has not been needed.  Continue ASA 81 mg QD  RENAL: A: Hyperkalemia - mild and eprsists   P: LR bolus  Lasix Follow BMP, urine output Continue Free water 200 ml Q4  Replace electrolytes as indicated Avoid nephrotoxic agents and ensure adequate renal perfusion  GASTROINTESTINAL: A: Nutrition needs P: Continue TF, tolerating well 55 cc/h Protonix for SUP  ENDOCRINE: A: Hyperglycemia P: SSI protocol  ID: A:  Strep Pneumoniae CAP with Bacteremia \ Fever 1/13 - still could be due to strep but will cover for HCAP given worsening hypoxemia P: Stopped vanco 1/16, continue meropenem Narrowed based on cx data from 1/13  NEURO: A: Sedation needs for vent synchrony   2017-07-06 - RASS -5/BIS 41/TOF 2 of u  P: Fentanyl gtt Continue versed Dc nimbex  Family:  Discussed case with his wife and daughter at bedside 1/16. Updated daughter, wife and grandaugther 06/13/2017. IDT Family meet with daughter, wife and in presence of RN Jul 06, 2017 - terminal wean next few hours if no dramatic response to lasix       The patient is critically  ill with multiple organ systems failure and requires high complexity decision making for assessment and support, frequent evaluation and titration of therapies, application of advanced monitoring technologies and extensive interpretation of multiple databases.   Critical Care Time devoted to patient care services described in this note is  30 minutes This time reflects time of care of this signee Dr Brand Males. This critical care time does not reflect procedure time, or teaching time or supervisory time of PA/NP/Med student/Med Resident etc but could involve care discussion time    Dr. Brand Males, M.D., [facility].C.P Pulmonary and Critical Care Medicine Staff Physician [facility] Pager: [phone], If no answer or between  15:00h - 7:00h: call [phone]  07/06/17 10:20 AM

## 2017-06-26 NOTE — Procedures (Signed)
Extubation Procedure Note  Patient Details:   Name: Patrick Carr DOB: [birth year] MRN: [id number]   Airway Documentation:  Airway 7.5 mm (Active)  Secured at (cm) 23 cm Jun 26, 2017 11:45 AM  Measured From Lips 26-Jun-2017 11:45 AM  Secured Location Left Jun 26, 2017  8:00 AM  Secured By [employer] 06-26-17 11:45 AM  Tube Holder Repositioned Yes 2017-06-26 11:45 AM  Cuff Pressure (cm H2O) 32 cm H2O 06/13/2017  9:42 PM  Site Condition Dry 06/26/2017  8:00 AM    Evaluation  O2 sats: transiently fell during during procedure Complications: No apparent complications Patient did tolerate procedure well. Bilateral Breath Sounds: Diminished   No  Baldwin [place] Nannette 06/26/17, 12:55 PM   Extubated Per Withdrawal of Life Sustaining Treatment

## 2017-06-26 NOTE — Progress Notes (Signed)
Continued decline Nimbex now off system TOF 4 and BIS is in high 50s and is breahign over vent Worsening hypoxemia despite lasix On pressors   dw daughter - initiate terminal wean protocol. Chaplain praying with family  Dr. Brand Males, M.D., [facility]C.P Pulmonary and Critical Care Medicine Staff Physician, [place] Director - Interstitial Lung Disease  Program  Pulmonary [place] [facility] [place], [place], [address]  Pager: [phone], If no answer or between  15:00h - 7:00h: call [phone] Telephone: [phone]

## 2017-06-26 NOTE — Progress Notes (Signed)
Chaplain responded to the pager request for spiritual care support for the family of a patient whose  Health is declaining. Chaplain provided emotional support to the family present at the bedside. A prayer of peace and comfort for the family was provided as the patient transcended. The family was appreciative of all the support they received from the staff. Chaplain Yaakov Guthrie [phone]

## 2017-06-26 NOTE — Progress Notes (Signed)
Patient pronounced at 1312 by Austin Miles RN and Leonie Man RN.  Family at bedside at time of death.

## 2017-06-26 DEATH — deceased

## 2017-08-07 ENCOUNTER — Other Ambulatory Visit: Payer: Medicare Other

## 2017-08-10 ENCOUNTER — Ambulatory Visit: Payer: Medicare Other | Admitting: Internal Medicine
